# Patient Record
Sex: Male | Born: 1955
Health system: Southern US, Community
[De-identification: ages and names within clinical notes are randomized; demographics above are authoritative.]

## PROBLEM LIST (undated history)

## (undated) DIAGNOSIS — I10 Essential (primary) hypertension: Secondary | ICD-10-CM

## (undated) DIAGNOSIS — I509 Heart failure, unspecified: Secondary | ICD-10-CM

## (undated) HISTORY — DX: Essential (primary) hypertension: I10

## (undated) HISTORY — DX: Heart failure, unspecified: I50.9

---

## 1998-05-16 ENCOUNTER — Emergency Department (HOSPITAL_COMMUNITY): Admission: EM | Admit: 1998-05-16 | Discharge: 1998-05-16 | Payer: Self-pay | Admitting: Emergency Medicine

## 1999-05-27 ENCOUNTER — Emergency Department (HOSPITAL_COMMUNITY): Admission: EM | Admit: 1999-05-27 | Discharge: 1999-05-27 | Payer: Self-pay | Admitting: Emergency Medicine

## 1999-05-27 ENCOUNTER — Encounter: Payer: Self-pay | Admitting: Emergency Medicine

## 1999-11-09 ENCOUNTER — Emergency Department (HOSPITAL_COMMUNITY): Admission: EM | Admit: 1999-11-09 | Discharge: 1999-11-09 | Payer: Self-pay | Admitting: Emergency Medicine

## 2000-01-18 ENCOUNTER — Encounter: Admission: RE | Admit: 2000-01-18 | Discharge: 2000-01-18 | Payer: Self-pay | Admitting: *Deleted

## 2000-01-18 ENCOUNTER — Encounter: Payer: Self-pay | Admitting: *Deleted

## 2000-09-13 ENCOUNTER — Emergency Department (HOSPITAL_COMMUNITY): Admission: EM | Admit: 2000-09-13 | Discharge: 2000-09-13 | Payer: Self-pay | Admitting: Emergency Medicine

## 2000-09-13 ENCOUNTER — Encounter: Payer: Self-pay | Admitting: Emergency Medicine

## 2009-05-07 ENCOUNTER — Emergency Department (HOSPITAL_COMMUNITY): Admission: EM | Admit: 2009-05-07 | Discharge: 2009-05-07 | Payer: Self-pay | Admitting: Emergency Medicine

## 2013-09-01 ENCOUNTER — Encounter (HOSPITAL_COMMUNITY): Payer: Self-pay | Admitting: *Deleted

## 2013-09-01 ENCOUNTER — Emergency Department (HOSPITAL_COMMUNITY)
Admission: EM | Admit: 2013-09-01 | Discharge: 2013-09-01 | Disposition: A | Discharge status: Home / Self Care | Payer: Self-pay | Attending: Emergency Medicine | Admitting: Emergency Medicine

## 2013-09-01 DIAGNOSIS — L255 Unspecified contact dermatitis due to plants, except food: Secondary | ICD-10-CM | POA: Insufficient documentation

## 2013-09-01 DIAGNOSIS — L237 Allergic contact dermatitis due to plants, except food: Secondary | ICD-10-CM

## 2013-09-01 DIAGNOSIS — IMO0002 Reserved for concepts with insufficient information to code with codable children: Secondary | ICD-10-CM | POA: Insufficient documentation

## 2013-09-01 DIAGNOSIS — L03114 Cellulitis of left upper limb: Secondary | ICD-10-CM

## 2013-09-01 MED ORDER — CEPHALEXIN 500 MG PO CAPS: 500.0000 mg | ORAL_CAPSULE | Freq: Four times a day (QID) | ORAL | Status: DC

## 2013-09-01 MED ORDER — LORATADINE 10 MG PO TABS
10.0000 mg | ORAL_TABLET | Freq: Once | ORAL | Status: AC
  Administered 2013-09-01: 10 mg via ORAL
  Filled 2013-09-01: qty 1

## 2013-09-01 MED ORDER — DIPHENHYDRAMINE HCL 25 MG PO TABS: 25.0000 mg | ORAL_TABLET | Freq: Four times a day (QID) | ORAL | Status: DC

## 2013-09-01 MED ORDER — HYDROCORTISONE 1 % EX CREA: TOPICAL_CREAM | Freq: Two times a day (BID) | CUTANEOUS | Status: DC

## 2013-09-01 MED ORDER — SULFAMETHOXAZOLE-TRIMETHOPRIM 800-160 MG PO TABS: 1.0000 | ORAL_TABLET | Freq: Two times a day (BID) | ORAL | Status: DC

## 2013-09-01 MED ORDER — PREDNISONE 20 MG PO TABS: 40.0000 mg | ORAL_TABLET | Freq: Every day | ORAL | Status: DC

## 2013-09-01 NOTE — ED Notes (Signed)
Pt reports having poison ivy to bilateral arms x 1 week, no relief with otc creams. No acute distress noted at triage.

## 2013-09-01 NOTE — ED Provider Notes (Signed)
Medical screening examination/treatment/procedure(s) were performed by non-physician practitioner and as supervising physician I was immediately available for consultation/collaboration.  Hoy Morn, MD 09/01/13 [phone]

## 2013-09-01 NOTE — ED Provider Notes (Signed)
CSN: [id number]     Arrival date & time 09/01/13  1021 History   First MD Initiated Contact with Patient 09/01/13 1143     Chief Complaint  Patient presents with  . Rash   (Consider location/radiation/quality/duration/timing/severity/associated sxs/prior Treatment) HPI Comments: Patient is a 57 yo M presenting to the ED after an exposure to poison ivy nearly two weeks ago while working outside. Patient states he initially developed the pruritic non-draining non-painful rash on his left UE and has spread to chest and right upper extremity. Patient has tried "drying the rash" with OTC poison ivy creams which have helped minimally with the pruritis, but it has not resolved. Patient states he has been scratching at the rash since the outbreak. He denies any drainage from rash. Denies any fevers, SOB, DOE, wheezing.   Patient is a 57 y.o. male presenting with rash.  Rash Associated symptoms: no fever, no shortness of breath and not wheezing     History reviewed. No pertinent past medical history. History reviewed. No pertinent past surgical history. History reviewed. No pertinent family history. History  Substance Use Topics  . Smoking status: Never Smoker   . Smokeless tobacco: Not on file  . Alcohol Use: Yes     Comment: occ    Review of Systems  Constitutional: Negative for fever.  Respiratory: Negative for cough, choking, chest tightness, shortness of breath, wheezing and stridor.   Cardiovascular: Negative for chest pain.  Skin: Positive for color change and rash.  All other systems reviewed and are negative.    Allergies  Review of patient's allergies indicates no known allergies.  Home Medications   Current Outpatient Rx  Name  Route  Sig  Dispense  Refill  . Diphenhydramine-Calamine (IVAREST EX)   Apply externally   Apply 1 application topically daily as needed (poision ivy).         . NON FORMULARY   Oral   Take 1 tablet by mouth daily as needed (poision iv).        . cephALEXin (KEFLEX) 500 MG capsule   Oral   Take 1 capsule (500 mg total) by mouth 4 (four) times daily.   40 capsule   0   . diphenhydrAMINE (BENADRYL) 25 MG tablet   Oral   Take 1 tablet (25 mg total) by mouth every 6 (six) hours.   20 tablet   0   . hydrocortisone cream 1 %   Topical   Apply topically 2 (two) times daily.   30 g   2   . predniSONE (DELTASONE) 20 MG tablet   Oral   Take 2 tablets (40 mg total) by mouth daily.   10 tablet   0   . sulfamethoxazole-trimethoprim (SEPTRA DS) 800-160 MG per tablet   Oral   Take 1 tablet by mouth every 12 (twelve) hours.   20 tablet   0    BP 161/117  Pulse 72  Temp(Src) 98 F (36.7 C) (Oral)  Resp 18  SpO2 100% Physical Exam  Constitutional: He is oriented to person, place, and time. He appears well-developed and well-nourished. No distress.  HENT:  Head: Normocephalic and atraumatic.  Right Ear: External ear normal.  Left Ear: External ear normal.  Nose: Nose normal.  Eyes: Conjunctivae are normal.  Neck: Neck supple.  Pulmonary/Chest: Effort normal and breath sounds normal. No respiratory distress. He has no wheezes.  Neurological: He is alert and oriented to person, place, and time.  Skin: Skin is warm and  dry. Rash noted. No abrasion and no petechiae noted. Rash is vesicular. He is not diaphoretic. There is erythema.     Linear vesicular rash on bilateral UE, chest. No back, face, or genital involvement.   Rash on left UE warm with surrounding erythema.   Psychiatric: He has a normal mood and affect.    ED Course  Procedures (including critical care time) Labs Review Labs Reviewed - No data to display Imaging Review No results found.  MDM   1. Cellulitis of arm, left   2. Poison ivy dermatitis      Afebrile, NAD, non-toxic appearing, AAOx4.   1) Rash: No evidence of SJS or necrotizing fasciitis. Due to pruritic and not painful nature of blisters do not suspect pemphigus vulgaris.  Pustules do not resemble scabies as per pt hx or allergic reaction. No blisters, no pustules, no warmth, no draining sinus tracts, no superficial abscesses, no bullous impetigo, no desquamation, no target lesions with dusky purpura or a central bulla. Not tender to touch. Linear vesicles consistent with poison ivy.   2) Cellulitis: Suspect uncomplicated cellulitis based on limited area of involvement, minimal pain, no systemic signs of illness (eg, fever, chills, dehydration, altered mental status, tachypnea, tachycardia, hypotension), no risk factors for serious illness (eg, extremes of age, general debility, immunocompromised status).  PE reveals redness, swelling, mildly tender, warm to touch. Skin intact, No bleeding. No bullae. Non purulent. Non circumferential.  Borders are not elevated or sharply demarcated.  Pt was instructed to return to the ED if area surpasses the boarder or pain intensifies.  Will prescribed Bactrim to cover for MRSA, direct pt to apply warm compresses and to return to ED for I&D if pain should increase or abscess should develop.     Harlow Mares, PA-C 09/01/13 1457

## 2013-09-01 NOTE — ED Notes (Signed)
Advised PA of patient b/p.  Patient is asymptomatic.  Patient advised will follow up with primary care physician to check on his HTN.

## 2013-09-01 NOTE — ED Notes (Signed)
Called pharmacy to find out where meds were.  They advised that they are on the way.  I had previously messaged them for the medication.

## 2013-10-27 ENCOUNTER — Encounter (HOSPITAL_COMMUNITY): Payer: Self-pay | Admitting: Emergency Medicine

## 2013-10-27 ENCOUNTER — Emergency Department (HOSPITAL_COMMUNITY)
Admission: EM | Admit: 2013-10-27 | Discharge: 2013-10-27 | Disposition: A | Discharge status: Home / Self Care | Payer: Medicaid Other | Attending: Emergency Medicine | Admitting: Emergency Medicine

## 2013-10-27 DIAGNOSIS — L237 Allergic contact dermatitis due to plants, except food: Secondary | ICD-10-CM

## 2013-10-27 DIAGNOSIS — IMO0001 Reserved for inherently not codable concepts without codable children: Secondary | ICD-10-CM | POA: Insufficient documentation

## 2013-10-27 DIAGNOSIS — Z79899 Other long term (current) drug therapy: Secondary | ICD-10-CM | POA: Insufficient documentation

## 2013-10-27 DIAGNOSIS — L255 Unspecified contact dermatitis due to plants, except food: Secondary | ICD-10-CM | POA: Insufficient documentation

## 2013-10-27 MED ORDER — PREDNISONE 20 MG PO TABS: 40.0000 mg | ORAL_TABLET | Freq: Every day | ORAL | Status: DC

## 2013-10-27 MED ORDER — DEXAMETHASONE SODIUM PHOSPHATE 10 MG/ML IJ SOLN
10.0000 mg | Freq: Once | INTRAMUSCULAR | Status: AC
  Administered 2013-10-27: 10 mg via INTRAMUSCULAR
  Filled 2013-10-27: qty 1

## 2013-10-27 NOTE — ED Provider Notes (Signed)
CSN: [id number]     Arrival date & time 10/27/13  1249 History  This chart was scribed for non-physician practitioner, Alvina Chou, PA-C working with Carmin Muskrat, MD by Frederich Balding, ED scribe. This patient was seen in room TR04C/TR04C and the patient's care was started at 1:45 PM.   Chief Complaint  Patient presents with  . Rash   The history is provided by the patient. No language interpreter was used.   HPI Comments: Joel Beck is a 57 y.o. male who presents to the Emergency Department complaining of gradual onset, worsening rash to his left forearm. He states it is itchy and painful. Pt states it has started to move further up his arm. He states he was previously seen here for the same and was told it was poison ivy. Pt states no one at home has a similar rash and he has not been around poison ivy.   History reviewed. No pertinent past medical history. History reviewed. No pertinent past surgical history. History reviewed. No pertinent family history. History  Substance Use Topics  . Smoking status: Never Smoker   . Smokeless tobacco: Not on file  . Alcohol Use: Yes     Comment: occ    Review of Systems  Musculoskeletal: Positive for myalgias.  Skin: Positive for rash.  All other systems reviewed and are negative.   Allergies  Review of patient's allergies indicates no known allergies.  Home Medications   Current Outpatient Rx  Name  Route  Sig  Dispense  Refill  . cephALEXin (KEFLEX) 500 MG capsule   Oral   Take 1 capsule (500 mg total) by mouth 4 (four) times daily.   40 capsule   0   . diphenhydrAMINE (BENADRYL) 25 MG tablet   Oral   Take 1 tablet (25 mg total) by mouth every 6 (six) hours.   20 tablet   0   . Diphenhydramine-Calamine (IVAREST EX)   Apply externally   Apply 1 application topically daily as needed (poision ivy).         . hydrocortisone cream 1 %   Topical   Apply topically 2 (two) times daily.   30 g   2   . NON  FORMULARY   Oral   Take 1 tablet by mouth daily as needed (poision iv).         . predniSONE (DELTASONE) 20 MG tablet   Oral   Take 2 tablets (40 mg total) by mouth daily.   10 tablet   0   . sulfamethoxazole-trimethoprim (SEPTRA DS) 800-160 MG per tablet   Oral   Take 1 tablet by mouth every 12 (twelve) hours.   20 tablet   0    BP 173/126  Pulse 85  Temp(Src) 98.2 F (36.8 C) (Oral)  Resp 16  Wt 189 lb 11.2 oz (86.047 kg)  SpO2 98%  Physical Exam  Nursing note and vitals reviewed. Constitutional: He is oriented to person, place, and time. He appears well-developed and well-nourished. No distress.  HENT:  Head: Normocephalic and atraumatic.  Eyes: EOM are normal.  Neck: Neck supple. No tracheal deviation present.  Cardiovascular: Normal rate.   Pulmonary/Chest: Effort normal. No respiratory distress.  Musculoskeletal: Normal range of motion.  Neurological: He is alert and oriented to person, place, and time.  Skin: Skin is warm and dry. Rash noted.  3 patches of vesicular lesions on left forearm on an erythematous base. No drainage noted.   Psychiatric: He has a  normal mood and affect. His behavior is normal.    ED Course  Procedures (including critical care time)  DIAGNOSTIC STUDIES: Oxygen Saturation is 98% on RA, normal by my interpretation.    COORDINATION OF CARE: 1:48 PM-Discussed treatment plan which includes prednisone and continuing previous prescriptions with pt at bedside and pt agreed to plan. Advised pt to follow up with a PCP for his blood pressure.   Labs Review Labs Reviewed - No data to display Imaging Review No results found.  EKG Interpretation   None       MDM   1. Poison ivy     1:50 PM Patient likely has poison ivy. Patient will have IM decadron and a prescription for prednisone. Vitals stable and patient afebrile.   I personally performed the services described in this documentation, which was scribed in my presence. The  recorded information has been reviewed and is accurate.   Alvina Chou, PA-C 10/27/13 1402

## 2013-10-27 NOTE — ED Notes (Signed)
Pt c/o rash to left forearm; pt sts seen here for same and told was poison ivy

## 2013-10-27 NOTE — ED Provider Notes (Signed)
  Medical screening examination/treatment/procedure(s) were performed by non-physician practitioner and as supervising physician I was immediately available for consultation/collaboration.  EKG Interpretation   None          Carmin Muskrat, MD 10/27/13 1627

## 2014-07-04 ENCOUNTER — Encounter (HOSPITAL_COMMUNITY): Payer: Self-pay | Admitting: Emergency Medicine

## 2014-07-04 ENCOUNTER — Emergency Department (HOSPITAL_COMMUNITY): Payer: Medicaid Other

## 2014-07-04 ENCOUNTER — Inpatient Hospital Stay (HOSPITAL_COMMUNITY)
Admission: EM | Admit: 2014-07-04 | Discharge: 2014-07-07 | DRG: 287 | Disposition: A | Discharge status: Home / Self Care | Payer: Medicaid Other | Attending: Internal Medicine | Admitting: Internal Medicine

## 2014-07-04 DIAGNOSIS — R079 Chest pain, unspecified: Secondary | ICD-10-CM

## 2014-07-04 DIAGNOSIS — I5021 Acute systolic (congestive) heart failure: Secondary | ICD-10-CM

## 2014-07-04 DIAGNOSIS — F1010 Alcohol abuse, uncomplicated: Secondary | ICD-10-CM | POA: Diagnosis present

## 2014-07-04 DIAGNOSIS — Z79899 Other long term (current) drug therapy: Secondary | ICD-10-CM

## 2014-07-04 DIAGNOSIS — K219 Gastro-esophageal reflux disease without esophagitis: Secondary | ICD-10-CM

## 2014-07-04 DIAGNOSIS — I5022 Chronic systolic (congestive) heart failure: Secondary | ICD-10-CM | POA: Diagnosis present

## 2014-07-04 DIAGNOSIS — I509 Heart failure, unspecified: Secondary | ICD-10-CM

## 2014-07-04 DIAGNOSIS — I2510 Atherosclerotic heart disease of native coronary artery without angina pectoris: Secondary | ICD-10-CM | POA: Diagnosis present

## 2014-07-04 DIAGNOSIS — E785 Hyperlipidemia, unspecified: Secondary | ICD-10-CM | POA: Diagnosis present

## 2014-07-04 DIAGNOSIS — F1410 Cocaine abuse, uncomplicated: Secondary | ICD-10-CM | POA: Diagnosis present

## 2014-07-04 DIAGNOSIS — K2100 Gastro-esophageal reflux disease with esophagitis, without bleeding: Secondary | ICD-10-CM

## 2014-07-04 DIAGNOSIS — I10 Essential (primary) hypertension: Secondary | ICD-10-CM | POA: Diagnosis present

## 2014-07-04 DIAGNOSIS — I428 Other cardiomyopathies: Secondary | ICD-10-CM | POA: Diagnosis present

## 2014-07-04 LAB — CBC
HCT: 41.5 % (ref 39.0–52.0)
Hemoglobin: 14.4 g/dL (ref 13.0–17.0)
MCH: 31.2 pg (ref 26.0–34.0)
MCHC: 34.7 g/dL (ref 30.0–36.0)
MCV: 89.8 fL (ref 78.0–100.0)
PLATELETS: 182 10*3/uL (ref 150–400)
RBC: 4.62 MIL/uL (ref 4.22–5.81)
RDW: 12.3 % (ref 11.5–15.5)
WBC: 6.7 10*3/uL (ref 4.0–10.5)

## 2014-07-04 LAB — RAPID URINE DRUG SCREEN, HOSP PERFORMED
AMPHETAMINES: NOT DETECTED
BARBITURATES: NOT DETECTED
BENZODIAZEPINES: NOT DETECTED
COCAINE: NOT DETECTED
Opiates: NOT DETECTED
Tetrahydrocannabinol: NOT DETECTED

## 2014-07-04 LAB — BASIC METABOLIC PANEL
ANION GAP: 11 (ref 5–15)
BUN: 17 mg/dL (ref 6–23)
CALCIUM: 8.9 mg/dL (ref 8.4–10.5)
CO2: 24 mEq/L (ref 19–32)
CREATININE: 1.29 mg/dL (ref 0.50–1.35)
Chloride: 107 mEq/L (ref 96–112)
GFR calc Af Amer: 70 mL/min — ABNORMAL LOW (ref >90)
GFR, EST NON AFRICAN AMERICAN: 60 mL/min — AB (ref >90)
Glucose, Bld: 89 mg/dL (ref 70–99)
Potassium: 4.2 mEq/L (ref 3.7–5.3)
Sodium: 142 mEq/L (ref 137–147)

## 2014-07-04 LAB — PRO B NATRIURETIC PEPTIDE: Pro B Natriuretic peptide (BNP): 1550 pg/mL — ABNORMAL HIGH (ref 0–125)

## 2014-07-04 LAB — LIPID PANEL
Cholesterol: 171 mg/dL (ref 0–200)
HDL: 45 mg/dL (ref >39)
LDL CALC: 105 mg/dL — AB (ref 0–99)
TRIGLYCERIDES: 107 mg/dL (ref <150)
Total CHOL/HDL Ratio: 3.8 RATIO
VLDL: 21 mg/dL (ref 0–40)

## 2014-07-04 LAB — I-STAT TROPONIN, ED: Troponin i, poc: 0.01 ng/mL (ref 0.00–0.08)

## 2014-07-04 LAB — TROPONIN I
Troponin I: <0.3 ng/mL (ref <0.30)
Troponin I: <0.3 ng/mL (ref <0.30)

## 2014-07-04 LAB — D-DIMER, QUANTITATIVE: D-Dimer, Quant: 1.14 ug/mL-FEU — ABNORMAL HIGH (ref 0.00–0.48)

## 2014-07-04 MED ORDER — ENOXAPARIN SODIUM 40 MG/0.4ML ~~LOC~~ SOLN
40.0000 mg | SUBCUTANEOUS | Status: DC
  Administered 2014-07-04: 40 mg via SUBCUTANEOUS
  Filled 2014-07-04 (×2): qty 0.4

## 2014-07-04 MED ORDER — NITROGLYCERIN 0.2 MG/HR TD PT24: 0.2000 mg | MEDICATED_PATCH | Freq: Every day | TRANSDERMAL | Status: DC

## 2014-07-04 MED ORDER — HYDRALAZINE HCL 20 MG/ML IJ SOLN: 10.0000 mg | Freq: Four times a day (QID) | INTRAMUSCULAR | Status: DC | PRN

## 2014-07-04 MED ORDER — IOHEXOL 350 MG/ML SOLN
80.0000 mL | Freq: Once | INTRAVENOUS | Status: AC | PRN
  Administered 2014-07-04: 80 mL via INTRAVENOUS

## 2014-07-04 MED ORDER — SODIUM CHLORIDE 0.9 % IV SOLN
250.0000 mL | INTRAVENOUS | Status: DC | PRN
Start: 2014-07-04 — End: 2014-07-07

## 2014-07-04 MED ORDER — PNEUMOCOCCAL VAC POLYVALENT 25 MCG/0.5ML IJ INJ
0.5000 mL | INJECTION | INTRAMUSCULAR | Status: AC
  Administered 2014-07-05: 0.5 mL via INTRAMUSCULAR
  Filled 2014-07-04: qty 0.5

## 2014-07-04 MED ORDER — ASPIRIN EC 325 MG PO TBEC
325.0000 mg | DELAYED_RELEASE_TABLET | Freq: Every day | ORAL | Status: DC
Start: 2014-07-04 — End: 2014-07-07
  Administered 2014-07-04 – 2014-07-07 (×4): 325 mg via ORAL
  Filled 2014-07-04 (×5): qty 1

## 2014-07-04 MED ORDER — ACETAMINOPHEN 325 MG PO TABS
650.0000 mg | ORAL_TABLET | ORAL | Status: DC | PRN
  Administered 2014-07-05: 650 mg via ORAL
  Filled 2014-07-04: qty 2

## 2014-07-04 MED ORDER — ENOXAPARIN SODIUM 40 MG/0.4ML ~~LOC~~ SOLN: 40.0000 mg | SUBCUTANEOUS | Status: DC

## 2014-07-04 MED ORDER — SODIUM CHLORIDE 0.9 % IV BOLUS (SEPSIS): 1000.0000 mL | Freq: Once | INTRAVENOUS | Status: DC

## 2014-07-04 MED ORDER — ONDANSETRON HCL 4 MG/2ML IJ SOLN: 4.0000 mg | Freq: Four times a day (QID) | INTRAMUSCULAR | Status: DC | PRN

## 2014-07-04 MED ORDER — GI COCKTAIL ~~LOC~~: 30.0000 mL | Freq: Three times a day (TID) | ORAL | Status: DC | PRN

## 2014-07-04 MED ORDER — FUROSEMIDE 10 MG/ML IJ SOLN
20.0000 mg | Freq: Once | INTRAMUSCULAR | Status: AC
  Administered 2014-07-04: 20 mg via INTRAVENOUS

## 2014-07-04 MED ORDER — SODIUM CHLORIDE 0.9 % IJ SOLN: 3.0000 mL | INTRAMUSCULAR | Status: DC | PRN

## 2014-07-04 MED ORDER — SODIUM CHLORIDE 0.9 % IV BOLUS (SEPSIS)
1000.0000 mL | Freq: Once | INTRAVENOUS | Status: AC
  Administered 2014-07-04: 1000 mL via INTRAVENOUS

## 2014-07-04 MED ORDER — ACETAMINOPHEN 325 MG PO TABS: 650.0000 mg | ORAL_TABLET | ORAL | Status: DC | PRN

## 2014-07-04 MED ORDER — PANTOPRAZOLE SODIUM 40 MG PO TBEC
40.0000 mg | DELAYED_RELEASE_TABLET | Freq: Every day | ORAL | Status: DC
  Administered 2014-07-05 – 2014-07-07 (×3): 40 mg via ORAL
  Filled 2014-07-04 (×3): qty 1

## 2014-07-04 MED ORDER — ISOSORBIDE MONONITRATE ER 30 MG PO TB24
30.0000 mg | ORAL_TABLET | Freq: Every day | ORAL | Status: DC
  Administered 2014-07-04 – 2014-07-07 (×4): 30 mg via ORAL
  Filled 2014-07-04 (×5): qty 1

## 2014-07-04 MED ORDER — METOPROLOL TARTRATE 1 MG/ML IV SOLN
20.0000 mg | Freq: Once | INTRAVENOUS | Status: AC
  Administered 2014-07-04: 10 mg via INTRAVENOUS
  Filled 2014-07-04: qty 20

## 2014-07-04 MED ORDER — FUROSEMIDE 10 MG/ML IJ SOLN
INTRAMUSCULAR | Status: AC
  Filled 2014-07-04: qty 4

## 2014-07-04 MED ORDER — CARVEDILOL 3.125 MG PO TABS
3.1250 mg | ORAL_TABLET | Freq: Two times a day (BID) | ORAL | Status: DC
  Administered 2014-07-04 – 2014-07-07 (×7): 3.125 mg via ORAL
  Filled 2014-07-04 (×11): qty 1

## 2014-07-04 MED ORDER — GI COCKTAIL ~~LOC~~
30.0000 mL | Freq: Four times a day (QID) | ORAL | Status: DC | PRN
  Filled 2014-07-04: qty 30

## 2014-07-04 MED ORDER — SODIUM CHLORIDE 0.9 % IJ SOLN
3.0000 mL | Freq: Two times a day (BID) | INTRAMUSCULAR | Status: DC
  Administered 2014-07-04 – 2014-07-07 (×5): 3 mL via INTRAVENOUS

## 2014-07-04 NOTE — ED Notes (Signed)
Returned from xray

## 2014-07-04 NOTE — ED Notes (Signed)
Pt returned from ct

## 2014-07-04 NOTE — ED Provider Notes (Signed)
CSN: [id number]     Arrival date & time 07/04/14  1106 History   First MD Initiated Contact with Patient 07/04/14 1157     Chief Complaint  Patient presents with  . Shortness of Breath  . Chest Pain     (Consider location/radiation/quality/duration/timing/severity/associated sxs/prior Treatment) HPI Comments: The patient is a 58 year old male presenting to emergency room at with a chief complaint of dyspnea worsening for 3 weeks. The patient reports increase in dyspnea on exertion. He reports orthopnea. He reports associated left-sided chest discomfort. He reports these symptoms worsen at night, with lying flat. He reports cough. The patient denies previous cardiac workup. Denies history of MI, cardiac disease, lung disorders, hypertension.  NO PCP  The history is provided by the patient. No language interpreter was used.    History reviewed. No pertinent past medical history. History reviewed. No pertinent past surgical history. History reviewed. No pertinent family history. History  Substance Use Topics  . Smoking status: Never Smoker   . Smokeless tobacco: Not on file  . Alcohol Use: Yes     Comment: occ    Review of Systems    Allergies  Review of patient's allergies indicates no known allergies.  Home Medications   Prior to Admission medications   Medication Sig Start Date End Date Taking? Authorizing Provider  naproxen sodium (ANAPROX) 220 MG tablet Take 220 mg by mouth daily as needed (Headache).   Yes Historical Provider, MD   BP 156/115  Pulse 99  Temp(Src) 98.1 F (36.7 C) (Oral)  Resp 22  SpO2 96% Physical Exam  Nursing note and vitals reviewed. Constitutional: He appears well-developed and well-nourished.  Non-toxic appearance. He does not have a sickly appearance. He does not appear ill. No distress.  HENT:  Head: Normocephalic and atraumatic.  Mouth/Throat: No oropharyngeal exudate.  Eyes: EOM are normal. Pupils are equal, round, and reactive to  light.  Neck: Neck supple.  Cardiovascular: Tachycardia present.   Pulses:      Radial pulses are 2+ on the right side, and 2+ on the left side.  No lower extremity edema, no calf tenderness.  Pulmonary/Chest: Effort normal. No respiratory distress. He has no wheezes. He has no rales.  Patient is able to speak in complete sentences.   Abdominal: Soft. There is no tenderness. There is no rebound.  Skin: Skin is warm and dry. He is not diaphoretic.    ED Course  Procedures (including critical care time) Labs Review Results for orders placed during the hospital encounter of 07/04/14  CBC      Result Value Ref Range   WBC 6.7  4.0 - 10.5 K/uL   RBC 4.62  4.22 - 5.81 MIL/uL   Hemoglobin 14.4  13.0 - 17.0 g/dL   HCT 41.5  39.0 - 52.0 %   MCV 89.8  78.0 - 100.0 fL   MCH 31.2  26.0 - 34.0 pg   MCHC 34.7  30.0 - 36.0 g/dL   RDW 12.3  11.5 - 15.5 %   Platelets 182  150 - 400 K/uL  BASIC METABOLIC PANEL      Result Value Ref Range   Sodium 142  137 - 147 mEq/L   Potassium 4.2  3.7 - 5.3 mEq/L   Chloride 107  96 - 112 mEq/L   CO2 24  19 - 32 mEq/L   Glucose, Bld 89  70 - 99 mg/dL   BUN 17  6 - 23 mg/dL   Creatinine, Ser 1.29  0.50 - 1.35 mg/dL   Calcium 8.9  8.4 - 10.5 mg/dL   GFR calc non Af Amer 60 (*) >90 mL/min   GFR calc Af Amer 70 (*) >90 mL/min   Anion gap 11  5 - 15  PRO B NATRIURETIC PEPTIDE      Result Value Ref Range   Pro B Natriuretic peptide (BNP) 1550.0 (*) 0 - 125 pg/mL  D-DIMER, QUANTITATIVE      Result Value Ref Range   D-Dimer, Quant 1.14 (*) 0.00 - 0.48 ug/mL-FEU  I-STAT TROPOININ, ED      Result Value Ref Range   Troponin i, poc 0.01  0.00 - 0.08 ng/mL   Comment 3            Dg Chest 2 View  07/04/2014   CLINICAL DATA:  Short of breath.  Chest pain.  EXAM: CHEST  2 VIEW  COMPARISON:  None.  FINDINGS: Moderate CHF is present with asymmetric pulmonary edema. There is diffuse interstitial edema with basilar predominant alveolar edema. Cardiopericardial  silhouette is enlarged. Aortic arch atherosclerosis. No focal consolidation to suggest pneumonia.  IMPRESSION: Moderate CHF.   Electronically Signed   By: Dereck Ligas M.D.   On: 07/04/2014 14:47   Ct Angio Chest Pe W/cm &/or Wo Cm  07/04/2014   CLINICAL DATA:  Left chest pain and some shortness of breath for the past 3 weeks.  EXAM: CT ANGIOGRAPHY CHEST WITH CONTRAST  TECHNIQUE: Multidetector CT imaging of the chest was performed using the standard protocol during bolus administration of intravenous contrast. Multiplanar CT image reconstructions and MIPs were obtained to evaluate the vascular anatomy.  CONTRAST:  2mL OMNIPAQUE IOHEXOL 350 MG/ML SOLN  COMPARISON:  Chest radiographs obtained earlier today.  FINDINGS: There is reflux of contrast into the inferior vena cava and hepatic veins. The heart is mildly enlarged with normal sized right atrium and ventricle and moderately dilated left ventricle and left atrium. The pulmonary arteries are normally opacified with no pulmonary arterial filling defects seen. The pulmonary vasculature remains prominent. Mild interstitial pulmonary edema is again demonstrated with peripheral Kerley lines. No lung nodules or enlarged lymph nodes. Mild thoracic and upper lumbar spine degenerative changes. Small right lobe liver cyst.  Review of the MIP images confirms the above findings.  IMPRESSION: 1. Cardiomegaly and mild acute congestive heart failure. 2. Reflux contrast into the inferior vena cava and hepatic veins, suggesting an element of right heart failure. 3. No pulmonary emboli.   Electronically Signed   By: Enrique Sack M.D.   On: 07/04/2014 15:23    EKG Interpretation   Date/Time:  Sunday July 04 2014 11:21:40 EDT Ventricular Rate:  108 PR Interval:  154 QRS Duration: 102 QT Interval:  360 QTC Calculation: 482 R Axis:   72 Text Interpretation:  Sinus tachycardia Biatrial enlargement Left  ventricular hypertrophy c increased voltage and Lateral T  inversions  Abnormal ECG Confirmed by Jeneen Rinks  MD, [place] ([id number]) on 07/04/2014 11:33:57 AM      MDM   Final diagnoses:  Acute heart failure, unspecified heart failure type   Patient presents with left-sided chest discomfort and dyspnea persistent for 3 weeks, worse with exertion. No risks factors for PE, but persistently tachycardic on exam no lower extremity edema or calf tenderness on exam. Will order a d-dimer.  EKG shows sinus tach, biatrial margin and in lateral T wave inversions no EKG available for comparison. D-dimer elevated at 1.15, CTA ordered.  Patient's blood pressure persistently elevated,  Lopressor ordered IV. Chest x-ray shows moderate CHF, BNP elevated at 1550. Per EMR the patient's blood pressure has been persistently elevated over the last 2 years. Plan to admit the patient for acute heart failure, and further evaluation of chest discomfort. Discussed labs,abnormal EKG, x-ray, results with patient and patient's family and need for admission, agree to admission at this time.  Discussed patient history and workup with Dr.Rai, who agrees to admission.   Lorrine Kin, PA-C 07/04/14 1945

## 2014-07-04 NOTE — H&P (Signed)
History and Physical       Hospital Admission Note Date: 07/04/2014  Patient name: Joel Beck Medical record number: [id number] Date of birth: [birth year] Age: 58 y.o. Gender: male  PCP: No PCP Per Patient    Chief Complaint:  Chest pain with shortness of breath, intermittent for last 2-3 weeks  HPI: Patient is a 58 year old male with no medical history, has not seen any any physicians in the past for regular medical care presented to ER with chief complaint of intermittent chest pain with shortness of breath for last 2-3 weeks. History was obtained from the patient who reported that he has noticed intermittent left-sided chest pain with associated shortness of breath, episodic, worse with exertion, lasting about 15-20 minutes and improved some rest. He also noticed shortness of breath worse with exertion and at night on laying flat. He denies any prior cardiac workup. He's not on any medications. At the time of arrival to the ER, patient's BP was 156/115. Patient also reports intermittent headaches and blurry vision, with chest pains. He denies any pedal edema. Patient also reports off and on "heartburn" .  Review of Systems:  Constitutional: Denies fever, chills, diaphoresis, poor appetite and fatigue.  HEENT: Denies photophobia, eye pain, redness, hearing loss, ear pain, congestion, sore throat, rhinorrhea, sneezing, mouth sores, trouble swallowing, neck pain, neck stiffness and tinnitus.   Respiratory: Please see history of present illness Cardiovascular: Please see history of present illness Gastrointestinal: Denies nausea, vomiting, abdominal pain, diarrhea, constipation, blood in stool and abdominal distention.  Genitourinary: Denies dysuria, urgency, frequency, hematuria, flank pain and difficulty urinating.  Musculoskeletal: Denies myalgias, back pain, joint swelling, arthralgias and gait problem.  Skin: Denies pallor, rash and  wound.  Neurological: Denies dizziness, seizures, syncope, weakness, light-headedness, numbness and headaches.  Hematological: Denies adenopathy. Easy bruising, personal or family bleeding history  Psychiatric/Behavioral: Denies suicidal ideation, mood changes, confusion, nervousness, sleep disturbance and agitation  Past Medical History: History reviewed. No pertinent past medical history. History reviewed. No pertinent past surgical history.  Medications: Prior to Admission medications   Medication Sig Start Date End Date Taking? Authorizing Provider  naproxen sodium (ANAPROX) 220 MG tablet Take 220 mg by mouth daily as needed (Headache).   Yes Historical Provider, MD    Allergies:  No Known Allergies  Social History:  reports that he has never smoked. He does not have any smokeless tobacco history on file. He reports that he drinks alcohol. He reports that he does not use illicit drugs.  Family History: History reviewed. No pertinent family history.  Physical Exam: Blood pressure 149/98, pulse 86, temperature 98.1 F (36.7 C), temperature source Oral, resp. rate 24, SpO2 99.00%. General: Alert, awake, oriented x3, in no acute distress. HEENT: normocephalic, atraumatic, anicteric sclera, pink conjunctiva, pupils equal and reactive to light and accomodation, oropharynx clear Neck: supple, no masses or lymphadenopathy, no goiter, no bruits  Heart: Regular rate and rhythm, without murmurs, rubs or gallops. Lungs: Clear to auscultation bilaterally, no wheezing, rales or rhonchi. Abdomen: Soft, nontender, nondistended, positive bowel sounds, no masses. Extremities: No clubbing, cyanosis or edema with positive pedal pulses. Neuro: Grossly intact, no focal neurological deficits, strength 5/5 upper and lower extremities bilaterally Psych: alert and oriented x 3, normal mood and affect Skin: no rashes or lesions, warm and dry   LABS on Admission:  Basic Metabolic Panel:  Recent  Labs Lab 07/04/14 1156  NA 142  K 4.2  CL 107  CO2 24  GLUCOSE 89  BUN 17  CREATININE 1.29  CALCIUM 8.9   Liver Function Tests: No results found for this basename: AST, ALT, ALKPHOS, BILITOT, PROT, ALBUMIN,  in the last 168 hours No results found for this basename: LIPASE, AMYLASE,  in the last 168 hours No results found for this basename: AMMONIA,  in the last 168 hours CBC:  Recent Labs Lab 07/04/14 1156  WBC 6.7  HGB 14.4  HCT 41.5  MCV 89.8  PLT 182   Cardiac Enzymes: No results found for this basename: CKTOTAL, CKMB, CKMBINDEX, TROPONINI,  in the last 168 hours BNP: No components found with this basename: POCBNP,  CBG: No results found for this basename: GLUCAP,  in the last 168 hours   Radiological Exams on Admission: Dg Chest 2 View  07/04/2014   CLINICAL DATA:  Short of breath.  Chest pain.  EXAM: CHEST  2 VIEW  COMPARISON:  None.  FINDINGS: Moderate CHF is present with asymmetric pulmonary edema. There is diffuse interstitial edema with basilar predominant alveolar edema. Cardiopericardial silhouette is enlarged. Aortic arch atherosclerosis. No focal consolidation to suggest pneumonia.  IMPRESSION: Moderate CHF.   Electronically Signed   By: Dereck Ligas M.D.   On: 07/04/2014 14:47   Ct Angio Chest Pe W/cm &/or Wo Cm  07/04/2014   CLINICAL DATA:  Left chest pain and some shortness of breath for the past 3 weeks.  EXAM: CT ANGIOGRAPHY CHEST WITH CONTRAST  TECHNIQUE: Multidetector CT imaging of the chest was performed using the standard protocol during bolus administration of intravenous contrast. Multiplanar CT image reconstructions and MIPs were obtained to evaluate the vascular anatomy.  CONTRAST:  68mL OMNIPAQUE IOHEXOL 350 MG/ML SOLN  COMPARISON:  Chest radiographs obtained earlier today.  FINDINGS: There is reflux of contrast into the inferior vena cava and hepatic veins. The heart is mildly enlarged with normal sized right atrium and ventricle and moderately  dilated left ventricle and left atrium. The pulmonary arteries are normally opacified with no pulmonary arterial filling defects seen. The pulmonary vasculature remains prominent. Mild interstitial pulmonary edema is again demonstrated with peripheral Kerley lines. No lung nodules or enlarged lymph nodes. Mild thoracic and upper lumbar spine degenerative changes. Small right lobe liver cyst.  Review of the MIP images confirms the above findings.  IMPRESSION: 1. Cardiomegaly and mild acute congestive heart failure. 2. Reflux contrast into the inferior vena cava and hepatic veins, suggesting an element of right heart failure. 3. No pulmonary emboli.   Electronically Signed   By: Enrique Sack M.D.   On: 07/04/2014 15:23    Assessment/Plan Principal Problem:   Chest pain: With typical features, risk factors include uncontrolled hypertension, EKG with T wave inversion in the lateral leads, severe LVH. CT angiogram negative for PE, shows cardiomegaly with acute CHF. Chest pain currently resolved - Admit to telemetry, observation, serial cardiac enzymes x3, urine drug screen - Obtain 2-D echocardiogram, lipid panel for further workup - Placed on heart healthy diet, n.p.o. after midnight for possible stress test, will obtain cardiology consult - Place on aspirin, Coreg, Imdur.  Active Problems:   Acute CHF: EF unknown, no prior workup, BNP elevated at 1550 - DC IV fluids, give 1 dose of Lasix 20 mg IV x1, Coreg, aspirin    Accelerated hypertension - Placed on Lasix, Coreg, hydralazine as needed  GERD: - Placed on Protonix, GI cocktail as needed   DVT prophylaxis: Lovenox  CODE STATUS: Full code  Family Communication: Admission, patients condition and plan of care  including tests being ordered have been discussed with the patient and family members who indicates understanding and agree with the plan and Code Status   Further plan will depend as patient's clinical course evolves and further  radiologic and laboratory data become available.   Time Spent on Admission: 37 minutes  Ozelle Brubacher M.D. [facility] 07/04/2014, 5:01 PM Pager: [phone]  If 7PM-7AM, please contact night-coverage [email or web address] Password [email or web address]  **Disclaimer: This note was dictated with voice recognition software. Similar sounding words can inadvertently be transcribed and this note may contain transcription errors which may not have been corrected upon publication of note.**

## 2014-07-04 NOTE — ED Notes (Signed)
Pt ambulatory in hallway brisk pace pt denies any pain or discomfort hr= 100/min oxygen sat 98%

## 2014-07-04 NOTE — ED Notes (Signed)
Pt c/o left sided CP and some SOB x 3 weeks; pt sts worse when moving around

## 2014-07-04 NOTE — ED Notes (Signed)
Pt to ct 

## 2014-07-05 ENCOUNTER — Encounter (HOSPITAL_COMMUNITY)
Admission: EM | Disposition: A | Discharge status: Home / Self Care | Payer: Self-pay | Source: Home / Self Care | Attending: Neurology

## 2014-07-05 DIAGNOSIS — K219 Gastro-esophageal reflux disease without esophagitis: Secondary | ICD-10-CM | POA: Diagnosis present

## 2014-07-05 DIAGNOSIS — I059 Rheumatic mitral valve disease, unspecified: Secondary | ICD-10-CM

## 2014-07-05 DIAGNOSIS — I10 Essential (primary) hypertension: Secondary | ICD-10-CM

## 2014-07-05 DIAGNOSIS — I5021 Acute systolic (congestive) heart failure: Secondary | ICD-10-CM | POA: Diagnosis present

## 2014-07-05 DIAGNOSIS — I509 Heart failure, unspecified: Secondary | ICD-10-CM

## 2014-07-05 DIAGNOSIS — Z79899 Other long term (current) drug therapy: Secondary | ICD-10-CM | POA: Diagnosis not present

## 2014-07-05 DIAGNOSIS — E785 Hyperlipidemia, unspecified: Secondary | ICD-10-CM | POA: Diagnosis present

## 2014-07-05 DIAGNOSIS — I428 Other cardiomyopathies: Secondary | ICD-10-CM | POA: Diagnosis present

## 2014-07-05 DIAGNOSIS — R079 Chest pain, unspecified: Secondary | ICD-10-CM | POA: Diagnosis present

## 2014-07-05 DIAGNOSIS — I2510 Atherosclerotic heart disease of native coronary artery without angina pectoris: Secondary | ICD-10-CM | POA: Diagnosis present

## 2014-07-05 DIAGNOSIS — F1410 Cocaine abuse, uncomplicated: Secondary | ICD-10-CM | POA: Diagnosis present

## 2014-07-05 DIAGNOSIS — F1010 Alcohol abuse, uncomplicated: Secondary | ICD-10-CM | POA: Diagnosis present

## 2014-07-05 DIAGNOSIS — I5022 Chronic systolic (congestive) heart failure: Secondary | ICD-10-CM | POA: Diagnosis present

## 2014-07-05 HISTORY — DX: Heart failure, unspecified: I50.9

## 2014-07-05 HISTORY — PX: LEFT HEART CATHETERIZATION WITH CORONARY ANGIOGRAM: SHX5451

## 2014-07-05 LAB — CBC
HEMATOCRIT: 37.9 % — AB (ref 39.0–52.0)
Hemoglobin: 13.1 g/dL (ref 13.0–17.0)
MCH: 31.3 pg (ref 26.0–34.0)
MCHC: 34.6 g/dL (ref 30.0–36.0)
MCV: 90.5 fL (ref 78.0–100.0)
Platelets: 182 10*3/uL (ref 150–400)
RBC: 4.19 MIL/uL — ABNORMAL LOW (ref 4.22–5.81)
RDW: 12.2 % (ref 11.5–15.5)
WBC: 7 10*3/uL (ref 4.0–10.5)

## 2014-07-05 LAB — BASIC METABOLIC PANEL
Anion gap: 12 (ref 5–15)
BUN: 23 mg/dL (ref 6–23)
CALCIUM: 8.5 mg/dL (ref 8.4–10.5)
CO2: 23 mEq/L (ref 19–32)
CREATININE: 1.36 mg/dL — AB (ref 0.50–1.35)
Chloride: 105 mEq/L (ref 96–112)
GFR calc non Af Amer: 57 mL/min — ABNORMAL LOW (ref >90)
GFR, EST AFRICAN AMERICAN: 66 mL/min — AB (ref >90)
Glucose, Bld: 91 mg/dL (ref 70–99)
Potassium: 3.9 mEq/L (ref 3.7–5.3)
Sodium: 140 mEq/L (ref 137–147)

## 2014-07-05 LAB — CREATININE, SERUM
Creatinine, Ser: 1.35 mg/dL (ref 0.50–1.35)
GFR calc Af Amer: 66 mL/min — ABNORMAL LOW (ref >90)
GFR calc non Af Amer: 57 mL/min — ABNORMAL LOW (ref >90)

## 2014-07-05 LAB — TROPONIN I: Troponin I: <0.3 ng/mL (ref <0.30)

## 2014-07-05 SURGERY — LEFT HEART CATHETERIZATION WITH CORONARY ANGIOGRAM
Anesthesia: LOCAL

## 2014-07-05 MED ORDER — HEPARIN SODIUM (PORCINE) 1000 UNIT/ML IJ SOLN
INTRAMUSCULAR | Status: AC
  Filled 2014-07-05: qty 1

## 2014-07-05 MED ORDER — NITROGLYCERIN 1 MG/10 ML FOR IR/CATH LAB
INTRA_ARTERIAL | Status: AC
  Filled 2014-07-05: qty 10

## 2014-07-05 MED ORDER — MIDAZOLAM HCL 2 MG/2ML IJ SOLN
INTRAMUSCULAR | Status: AC
  Filled 2014-07-05: qty 2

## 2014-07-05 MED ORDER — SODIUM CHLORIDE 0.9 % IV SOLN
INTRAVENOUS | Status: AC
  Administered 2014-07-05: 17:00:00 via INTRAVENOUS

## 2014-07-05 MED ORDER — LIDOCAINE HCL (PF) 1 % IJ SOLN
INTRAMUSCULAR | Status: AC
  Filled 2014-07-05: qty 30

## 2014-07-05 MED ORDER — FENTANYL CITRATE 0.05 MG/ML IJ SOLN
INTRAMUSCULAR | Status: AC
  Filled 2014-07-05: qty 2

## 2014-07-05 MED ORDER — VERAPAMIL HCL 2.5 MG/ML IV SOLN
INTRAVENOUS | Status: AC
  Filled 2014-07-05: qty 2

## 2014-07-05 MED ORDER — ENOXAPARIN SODIUM 40 MG/0.4ML ~~LOC~~ SOLN
40.0000 mg | SUBCUTANEOUS | Status: DC
  Administered 2014-07-06 – 2014-07-07 (×2): 40 mg via SUBCUTANEOUS
  Filled 2014-07-05 (×3): qty 0.4

## 2014-07-05 MED ORDER — HEPARIN (PORCINE) IN NACL 2-0.9 UNIT/ML-% IJ SOLN
INTRAMUSCULAR | Status: AC
  Filled 2014-07-05: qty 1000

## 2014-07-05 NOTE — Interval H&P Note (Signed)
History and Physical Interval Note:  07/05/2014 3:47 PM  Joel Beck  has presented today for surgery, with the diagnosis of cp  The various methods of treatment have been discussed with the patient and family. After consideration of risks, benefits and other options for treatment, the patient has consented to  Procedure(s): LEFT HEART CATHETERIZATION WITH CORONARY ANGIOGRAM (N/A) as a surgical intervention .  The patient's history has been reviewed, patient examined, no change in status, stable for surgery.  I have reviewed the patient's chart and labs.  Questions were answered to the patient's satisfaction.   Cath Lab Visit (complete for each Cath Lab visit)  Clinical Evaluation Leading to the Procedure:   ACS: Yes.    Non-ACS:    Anginal Classification: CCS III  Anti-ischemic medical therapy: Maximal Therapy (2 or more classes of medications)  Non-Invasive Test Results: High-risk stress test findings: cardiac mortality >3%/year  Prior CABG: No previous CABG        Joel Beck [facility] 07/05/2014 3:47 PM

## 2014-07-05 NOTE — Care Management Note (Addendum)
  Page 2 of 2   07/07/2014     2:30:13 PM CARE MANAGEMENT NOTE 07/07/2014  Patient:  Joel Beck,Joel Beck   Account Number:  [id number]  Date Initiated:  07/05/2014  Documentation initiated by:  Luwanna Brossman  Subjective/Objective Assessment:   Chest pain, HTN, Heart failure     Action/Plan:   CM to follow for dispositon needs   Anticipated DC Date:  07/06/2014   Anticipated DC Plan:  HOME/SELF CARE  In-house referral  Clinical Social Worker      DC Planning Services  CM consult  Follow-up appt scheduled  Medication [facility]      Choice offered to / List presented to:     DME arranged  VEST - LIFE VEST           Status of service:  Completed, signed off Medicare Important Message given?   (If response is "NO", the following Medicare IM given date fields will be blank) Date Medicare IM given:   Medicare IM given by:   Date Additional Medicare IM given:   Additional Medicare IM given by:    Discharge Disposition:  HOME/SELF CARE  Per UR Regulation:  Reviewed for med. necessity/level of care/duration of stay  If discussed at [place] of Stay Meetings, dates discussed:    Comments:  Joe Tanney RN, BSN, MSHL, CCM  Nurse - Case Manager,  (Unit [phone]  8/3/201 Meds reviewed with Unit Pharmacist for $4.00 medication list. LIFE VEST  order intiated and representative/[place] working with processing. 2nd attempted call left with F.A. Aileen Fass   [id number] re:  self pay / mcd potential CM contacted other FA Michele [place] who confirms MCD appt scheduled for 2:30 today. Disposition:  Home / Self care with life vest  Ruhan Borak RN, BSN, MSHL, CCM  Nurse - Case Manager,  (Unit [place])  [phone]  8/3/201 Social:  From home.  Self pay and no PCP. Cardiology/PCP appt established with CHW with Dr. Verl Blalock 07/21/2012 at 123456 [place] card application provided to patient $4.00 list sample provided to patient with instructions on  available pharmacies. CM Left VMM with F.A. Aileen Fass  [id number] re:  self pay / mcd potential Disposition Plan:  Home / self care. CM will review meds at Beck/c for $4.00 list option v need for [facility] assistance.

## 2014-07-05 NOTE — Progress Notes (Signed)
Echocardiogram 2D Echocardiogram has been performed.  Joel Beck 07/05/2014, 10:03 AM

## 2014-07-05 NOTE — H&P (View-Only) (Signed)
CARDIOLOGY CONSULT NOTE   Patient ID: Joel Beck MRN: [id number], DOB/AGE: 1956-02-16   Admit date: 07/04/2014 Date of Consult: 07/05/2014  Primary Physician: No PCP Per Patient Primary Cardiologist: None  Reason for consult:  Chest pain  Problem List  History reviewed. No pertinent past medical history.  History reviewed. No pertinent past surgical history.   Allergies  No Known Allergies  HPI   Patient is a 58 year old male with no medical history, has not seen any any physicians in the past for regular medical care presented to ER with chief complaint of intermittent chest pain with shortness of breath for last 2-3 weeks. History was obtained from the patient who reported that he has noticed intermittent left-sided chest pain with associated shortness of breath, episodic, worse with exertion, lasting about 15-20 minutes and improved some rest. He admits to orthopnea and paroxysmal nocturnal dyspnea in the last 2 weeks. This has resolved since the admission He's not on any medications. At the time of arrival to the ER, patient's BP was 156/115. Patient also reports intermittent headaches and blurry vision, with chest pains. He denies any pedal edema. Patient also reports off and on "heartburn". He has never smoked. No FH of premature CAD. He complains of lot of stress at home.   Inpatient Medications  . aspirin EC  325 mg Oral Daily  . carvedilol  3.125 mg Oral BID WC  . enoxaparin (LOVENOX) injection  40 mg Subcutaneous Q24H  . isosorbide mononitrate  30 mg Oral Daily  . pantoprazole  40 mg Oral Q0600  . pneumococcal 23 valent vaccine  0.5 mL Intramuscular Tomorrow-1000  . sodium chloride  3 mL Intravenous Q12H    Family History History reviewed. No pertinent family history.   Social History History   Social History  . Marital Status: Married    Spouse Name: N/A    Number of Children: N/A  . Years of Education: N/A   Occupational History  . Not on file.    Social History Main Topics  . Smoking status: Never Smoker   . Smokeless tobacco: Not on file  . Alcohol Use: Yes     Comment: occ  . Drug Use: No  . Sexual Activity: Not on file   Other Topics Concern  . Not on file   Social History Narrative  . No narrative on file    Review of Systems  General:  No chills, fever, night sweats or weight changes.  Cardiovascular:  No chest pain, dyspnea on exertion, edema, orthopnea, palpitations, paroxysmal nocturnal dyspnea. Dermatological: No rash, lesions/masses Respiratory: No cough, dyspnea Urologic: No hematuria, dysuria Abdominal:   No nausea, vomiting, diarrhea, bright red blood per rectum, melena, or hematemesis Neurologic:  No visual changes, wkns, changes in mental status. All other systems reviewed and are otherwise negative except as noted above.  Physical Exam  Blood pressure 122/82, pulse 70, temperature 98.2 F (36.8 C), temperature source Oral, resp. rate 18, height 5\' 10"  (1.778 m), weight 177 lb 1.6 oz (80.332 kg), SpO2 97.00%.  General: Pleasant, NAD Psych: Normal affect. Neuro: Alert and oriented X 3. Moves all extremities spontaneously. HEENT: Normal  Neck: Supple without bruits, JVD + 6 cm. Lungs:  Resp regular and unlabored, mild crackles at bases. Heart: RRR no s3, s4, or murmurs. Abdomen: Soft, non-tender, non-distended, BS + x 4.  Extremities: No clubbing, cyanosis or edema. DP/PT/Radials 2+ and equal bilaterally.  Labs  Recent Labs  07/04/14 1839 07/04/14 2136 07/05/14 0015  TROPONINI <0.30 <0.30 <0.30   Lab Results  Component Value Date   WBC 6.7 07/04/2014   HGB 14.4 07/04/2014   HCT 41.5 07/04/2014   MCV 89.8 07/04/2014   PLT 182 07/04/2014    Recent Labs Lab 07/05/14 0015  NA 140  K 3.9  CL 105  CO2 23  BUN 23  CREATININE 1.36*  CALCIUM 8.5  GLUCOSE 91   Lab Results  Component Value Date   CHOL 171 07/04/2014   HDL 45 07/04/2014   LDLCALC 105* 07/04/2014   TRIG 107 07/04/2014   Lab Results   Component Value Date   DDIMER 1.14* 07/04/2014   Radiology/Studies  Dg Chest 2 View  07/04/2014   CLINICAL DATA:  Short of breath.  Chest pain.  EXAM: CHEST  2 VIEW  COMPARISON:  None.  FINDINGS: Moderate CHF is present with asymmetric pulmonary edema. There is diffuse interstitial edema with basilar predominant alveolar edema. Cardiopericardial silhouette is enlarged. Aortic arch atherosclerosis. No focal consolidation to suggest pneumonia.  IMPRESSION: Moderate CHF.   Electronically Signed   By: Dereck Ligas M.D.   On: 07/04/2014 14:47   Ct Angio Chest Pe W/cm &/or Wo Cm  07/04/2014   CLINICAL DATA:  Left chest pain and some shortness of breath for the past 3 weeks.  IMPRESSION: 1. Cardiomegaly and mild acute congestive heart failure. 2. Reflux contrast into the inferior vena cava and hepatic veins, suggesting an element of right heart failure. 3. No pulmonary emboli.     Echocardiogram - none  ECG: ST, LVH, inferolateral STD and inverted T waves    ASSESSMENT AND PLAN   1. Typical chest pain - negative cardiac enzymes, ECG with inferolateral leads that might be secondary to LVH however suspicious f ischemia. - continue aspirin, statin, BB, hold ACEI as Crea is elevated - we will schedule cath for today  2. Acute CHF - unknown etiology - we will follow echo for systolic and diastolic function - hold Lasix before the cath, negative -1000 since the admission  3. HTN - with significant LVH on ECG, we will await for echo, agree with BB and Imdur  4. Hyperlipidemia - continue atorvastatin  Signed, Dorothy Spark, MD, [facility] 07/05/2014, 9:20 AM

## 2014-07-05 NOTE — CV Procedure (Signed)
    Cardiac Catheterization Procedure Note  Name: Joel Beck MRN: [id number] DOB: 1956-03-03  Procedure: Left Heart Cath, Selective Coronary Angiography, LV angiography  Indication: 58 yo male with new onset CHF with EF 25% and chest pain.   Procedural Details: The right wrist was prepped, draped, and anesthetized with 1% lidocaine. Using the modified Seldinger technique, a 6 French slender sheath was introduced into the right radial artery. 3 mg of verapamil was administered through the sheath, weight-based unfractionated heparin was administered intravenously. Standard Judkins catheters were used for selective coronary angiography and left ventriculography. Catheter exchanges were performed over an exchange length guidewire. There were no immediate procedural complications. A TR band was used for radial hemostasis at the completion of the procedure.  The patient was transferred to the post catheterization recovery area for further monitoring.  Procedural Findings: Hemodynamics: AO 133/96 mean 111 mm Hg LV 130/25 mm Hg  Coronary angiography: Coronary dominance: right  Left mainstem: Normal.  Left anterior descending (LAD): Mild irregularities in the proximal and mid vessel < 20%. Normal diagonal.  Ramus intermediate: large, normal.  Left circumflex (LCx): Normal.  Right coronary artery (RCA): Very large vessel. Normal.  Left ventriculography: Left ventricular systolic function is abnormal, there is severe global hypokinesis and severe LV enlargement. LVEF is estimated at 25%, there is no significant mitral regurgitation   Final Conclusions:   1. No significant CAD 2. Severe LV dysfunction.  Recommendations: Medical management of CHF.  Ameli Sangiovanni [place], [place]  07/05/2014, 4:14 PM

## 2014-07-05 NOTE — Progress Notes (Addendum)
CARDIOLOGY CONSULT NOTE   Patient ID: Joel Beck MRN: [id number], DOB/AGE: 1956/05/22   Admit date: 07/04/2014 Date of Consult: 07/05/2014  Primary Physician: No PCP Per Patient Primary Cardiologist: None  Reason for consult:  Chest pain  Problem List  History reviewed. No pertinent past medical history.  History reviewed. No pertinent past surgical history.   Allergies  No Known Allergies  HPI   Patient is a 58 year old male with no medical history, has not seen any any physicians in the past for regular medical care presented to ER with chief complaint of intermittent chest pain with shortness of breath for last 2-3 weeks. History was obtained from the patient who reported that he has noticed intermittent left-sided chest pain with associated shortness of breath, episodic, worse with exertion, lasting about 15-20 minutes and improved some rest. He admits to orthopnea and paroxysmal nocturnal dyspnea in the last 2 weeks. This has resolved since the admission He's not on any medications. At the time of arrival to the ER, patient's BP was 156/115. Patient also reports intermittent headaches and blurry vision, with chest pains. He denies any pedal edema. Patient also reports off and on "heartburn". He has never smoked. No FH of premature CAD. He complains of lot of stress at home.   Inpatient Medications  . aspirin EC  325 mg Oral Daily  . carvedilol  3.125 mg Oral BID WC  . enoxaparin (LOVENOX) injection  40 mg Subcutaneous Q24H  . isosorbide mononitrate  30 mg Oral Daily  . pantoprazole  40 mg Oral Q0600  . pneumococcal 23 valent vaccine  0.5 mL Intramuscular Tomorrow-1000  . sodium chloride  3 mL Intravenous Q12H    Family History History reviewed. No pertinent family history.   Social History History   Social History  . Marital Status: Married    Spouse Name: N/A    Number of Children: N/A  . Years of Education: N/A   Occupational History  . Not on file.    Social History Main Topics  . Smoking status: Never Smoker   . Smokeless tobacco: Not on file  . Alcohol Use: Yes     Comment: occ  . Drug Use: No  . Sexual Activity: Not on file   Other Topics Concern  . Not on file   Social History Narrative  . No narrative on file    Review of Systems  General:  No chills, fever, night sweats or weight changes.  Cardiovascular:  No chest pain, dyspnea on exertion, edema, orthopnea, palpitations, paroxysmal nocturnal dyspnea. Dermatological: No rash, lesions/masses Respiratory: No cough, dyspnea Urologic: No hematuria, dysuria Abdominal:   No nausea, vomiting, diarrhea, bright red blood per rectum, melena, or hematemesis Neurologic:  No visual changes, wkns, changes in mental status. All other systems reviewed and are otherwise negative except as noted above.  Physical Exam  Blood pressure 122/82, pulse 70, temperature 98.2 F (36.8 C), temperature source Oral, resp. rate 18, height 5\' 10"  (1.778 m), weight 177 lb 1.6 oz (80.332 kg), SpO2 97.00%.  General: Pleasant, NAD Psych: Normal affect. Neuro: Alert and oriented X 3. Moves all extremities spontaneously. HEENT: Normal  Neck: Supple without bruits, JVD + 6 cm. Lungs:  Resp regular and unlabored, mild crackles at bases. Heart: RRR no s3, s4, or murmurs. Abdomen: Soft, non-tender, non-distended, BS + x 4.  Extremities: No clubbing, cyanosis or edema. DP/PT/Radials 2+ and equal bilaterally.  Labs  Recent Labs  07/04/14 1839 07/04/14 2136 07/05/14 0015  TROPONINI <0.30 <0.30 <0.30   Lab Results  Component Value Date   WBC 6.7 07/04/2014   HGB 14.4 07/04/2014   HCT 41.5 07/04/2014   MCV 89.8 07/04/2014   PLT 182 07/04/2014    Recent Labs Lab 07/05/14 0015  NA 140  K 3.9  CL 105  CO2 23  BUN 23  CREATININE 1.36*  CALCIUM 8.5  GLUCOSE 91   Lab Results  Component Value Date   CHOL 171 07/04/2014   HDL 45 07/04/2014   LDLCALC 105* 07/04/2014   TRIG 107 07/04/2014   Lab Results   Component Value Date   DDIMER 1.14* 07/04/2014   Radiology/Studies  Dg Chest 2 View  07/04/2014   CLINICAL DATA:  Short of breath.  Chest pain.  EXAM: CHEST  2 VIEW  COMPARISON:  None.  FINDINGS: Moderate CHF is present with asymmetric pulmonary edema. There is diffuse interstitial edema with basilar predominant alveolar edema. Cardiopericardial silhouette is enlarged. Aortic arch atherosclerosis. No focal consolidation to suggest pneumonia.  IMPRESSION: Moderate CHF.   Electronically Signed   By: Dereck Ligas M.D.   On: 07/04/2014 14:47   Ct Angio Chest Pe W/cm &/or Wo Cm  07/04/2014   CLINICAL DATA:  Left chest pain and some shortness of breath for the past 3 weeks.  IMPRESSION: 1. Cardiomegaly and mild acute congestive heart failure. 2. Reflux contrast into the inferior vena cava and hepatic veins, suggesting an element of right heart failure. 3. No pulmonary emboli.     Echocardiogram - none  ECG: ST, LVH, inferolateral STD and inverted T waves    ASSESSMENT AND PLAN   1. Typical chest pain - negative cardiac enzymes, ECG with inferolateral leads that might be secondary to LVH however suspicious f ischemia. - continue aspirin, statin, BB, hold ACEI as Crea is elevated - we will schedule cath for today  2. Acute CHF - unknown etiology - we will follow echo for systolic and diastolic function - hold Lasix before the cath, negative -1000 since the admission  3. HTN - with significant LVH on ECG, we will await for echo, agree with BB and Imdur  4. Hyperlipidemia - continue atorvastatin  Signed, Dorothy Spark, MD, [facility] 07/05/2014, 9:20 AM

## 2014-07-05 NOTE — Progress Notes (Addendum)
[facility] PROGRESS NOTE  Joel Beck [id number] DOB: 1956-07-04 DOA: 07/04/2014 PCP: No PCP Per Patient  Assessment/Plan: 1. Chest Pain -Patient with no reported past medical history presenting with CP having typical features -Presented hypertensive with blood pressure of 162/115 -Troponins cycled over night remained negative -He was started on beta blocker and Nitrates -Case discussed with cardiology for consultation and I feel he may need further work up possibly with Cath  2. Accelerated Hypertension -He presented with BP of  162/115, complaining of chest pain -Unaware of diagnosis of HTN, EKG showed LVH -Blood pressures improved with the administration of Imdur and Coreg  3.  Acute Congestive Heart Failure, unknown ejection fraction -Initial labs showing an elevated BNP of 1550, with CXR showing evidence of Pulmonary Edema. -Laxis 20 mg IV administered with improvement -Follow up on 2D echo -Cardiology consultation placed, may need Cath  4. GERD -On PPI  5. DVT prophylaxis -Lovenox   Code Status: Full Code Family Communication:  Disposition Plan: Cardiology consult   Consultants:  Cardiology  HPI/Subjective: Patient is a pleasant 58 year old with no reported past medical history who presented to the emergency room at [facility] on 07/04/2014 with complaints of chest pain and associated shortness of breath. He described having these symptoms intermittently over the past 2-3 weeks, precipitated by physical exertion. EKG showed T wave inversions to lateral leads with LVH. CT scan of lungs with IV contrast showed no evidence of pulmonary emboli.He was admitted to the the medicine service for further workup.    Objective: Filed Vitals:   07/05/14 0533  BP: 122/82  Pulse: 70  Temp: 98.2 F (36.8 C)  Resp: 18    Intake/Output Summary (Last 24 hours) at 07/05/14 0816 Last data filed at 07/05/14 0539  Gross per 24 hour  Intake    540 ml  Output    1625 ml  Net  -1085 ml   Filed Weights   07/04/14 1700 07/05/14 0533  Weight: 80.922 kg (178 lb 6.4 oz) 80.332 kg (177 lb 1.6 oz)    Exam:   General:  No acute distress, awake and alert  Cardiovascular: Regular rate and rhythm, normal S1S2  Respiratory: Clear to auscultation bilaterally, normal inspiratory effort  Abdomen: Soft, nontender nondistened  Musculoskeletal: No edema  Data Reviewed: Basic Metabolic Panel:  Recent Labs Lab 07/04/14 1156 07/05/14 0015  NA 142 140  K 4.2 3.9  CL 107 105  CO2 24 23  GLUCOSE 89 91  BUN 17 23  CREATININE 1.29 1.36*  CALCIUM 8.9 8.5   Liver Function Tests: No results found for this basename: AST, ALT, ALKPHOS, BILITOT, PROT, ALBUMIN,  in the last 168 hours No results found for this basename: LIPASE, AMYLASE,  in the last 168 hours No results found for this basename: AMMONIA,  in the last 168 hours CBC:  Recent Labs Lab 07/04/14 1156  WBC 6.7  HGB 14.4  HCT 41.5  MCV 89.8  PLT 182   Cardiac Enzymes:  Recent Labs Lab 07/04/14 1839 07/04/14 2136 07/05/14 0015  TROPONINI <0.30 <0.30 <0.30   BNP (last 3 results)  Recent Labs  07/04/14 1156  PROBNP 1550.0*   CBG: No results found for this basename: GLUCAP,  in the last 168 hours  No results found for this or any previous visit (from the past 240 hour(s)).   Studies: Dg Chest 2 View  07/04/2014   CLINICAL DATA:  Short of breath.  Chest pain.  EXAM: CHEST  2 VIEW  COMPARISON:  None.  FINDINGS: Moderate CHF is present with asymmetric pulmonary edema. There is diffuse interstitial edema with basilar predominant alveolar edema. Cardiopericardial silhouette is enlarged. Aortic arch atherosclerosis. No focal consolidation to suggest pneumonia.  IMPRESSION: Moderate CHF.   Electronically Signed   By: Dereck Ligas M.D.   On: 07/04/2014 14:47   Ct Angio Chest Pe W/cm &/or Wo Cm  07/04/2014   CLINICAL DATA:  Left chest pain and some shortness of breath for the past 3  weeks.  EXAM: CT ANGIOGRAPHY CHEST WITH CONTRAST  TECHNIQUE: Multidetector CT imaging of the chest was performed using the standard protocol during bolus administration of intravenous contrast. Multiplanar CT image reconstructions and MIPs were obtained to evaluate the vascular anatomy.  CONTRAST:  73mL OMNIPAQUE IOHEXOL 350 MG/ML SOLN  COMPARISON:  Chest radiographs obtained earlier today.  FINDINGS: There is reflux of contrast into the inferior vena cava and hepatic veins. The heart is mildly enlarged with normal sized right atrium and ventricle and moderately dilated left ventricle and left atrium. The pulmonary arteries are normally opacified with no pulmonary arterial filling defects seen. The pulmonary vasculature remains prominent. Mild interstitial pulmonary edema is again demonstrated with peripheral Kerley lines. No lung nodules or enlarged lymph nodes. Mild thoracic and upper lumbar spine degenerative changes. Small right lobe liver cyst.  Review of the MIP images confirms the above findings.  IMPRESSION: 1. Cardiomegaly and mild acute congestive heart failure. 2. Reflux contrast into the inferior vena cava and hepatic veins, suggesting an element of right heart failure. 3. No pulmonary emboli.   Electronically Signed   By: Enrique Sack M.D.   On: 07/04/2014 15:23    Scheduled Meds: . aspirin EC  325 mg Oral Daily  . carvedilol  3.125 mg Oral BID WC  . enoxaparin (LOVENOX) injection  40 mg Subcutaneous Q24H  . isosorbide mononitrate  30 mg Oral Daily  . pantoprazole  40 mg Oral Q0600  . pneumococcal 23 valent vaccine  0.5 mL Intramuscular Tomorrow-1000  . sodium chloride  3 mL Intravenous Q12H   Continuous Infusions:   Principal Problem:   Chest pain Active Problems:   Acute CHF   Accelerated hypertension   GERD (gastroesophageal reflux disease)    Time spent: 30 min    Kelvin Cellar  [facility] Pager [phone]. If 7PM-7AM, please contact night-coverage at  [email or web address], password [facility] 07/05/2014, 8:16 AM  LOS: 1 day

## 2014-07-06 DIAGNOSIS — I5021 Acute systolic (congestive) heart failure: Principal | ICD-10-CM

## 2014-07-06 LAB — BASIC METABOLIC PANEL
Anion gap: 11 (ref 5–15)
BUN: 25 mg/dL — AB (ref 6–23)
CHLORIDE: 107 meq/L (ref 96–112)
CO2: 23 meq/L (ref 19–32)
CREATININE: 1.27 mg/dL (ref 0.50–1.35)
Calcium: 8.5 mg/dL (ref 8.4–10.5)
GFR calc Af Amer: 71 mL/min — ABNORMAL LOW (ref >90)
GFR calc non Af Amer: 62 mL/min — ABNORMAL LOW (ref >90)
Glucose, Bld: 90 mg/dL (ref 70–99)
Potassium: 3.8 mEq/L (ref 3.7–5.3)
Sodium: 141 mEq/L (ref 137–147)

## 2014-07-06 MED ORDER — SPIRONOLACTONE 12.5 MG HALF TABLET
12.5000 mg | ORAL_TABLET | Freq: Every day | ORAL | Status: DC
  Administered 2014-07-06 – 2014-07-07 (×2): 12.5 mg via ORAL
  Filled 2014-07-06 (×2): qty 1

## 2014-07-06 MED ORDER — LISINOPRIL 5 MG PO TABS
5.0000 mg | ORAL_TABLET | Freq: Every day | ORAL | Status: DC
  Administered 2014-07-06 – 2014-07-07 (×2): 5 mg via ORAL
  Filled 2014-07-06 (×2): qty 1

## 2014-07-06 NOTE — Clinical Documentation Improvement (Signed)
PLEASE SPECIFY THE TYPE OF CHF Possible Clinical Conditions?  Chronic Systolic Congestive Heart Failure Chronic Diastolic Congestive Heart Failure Chronic Systolic & Diastolic Congestive Heart Failure Acute Systolic Congestive Heart Failure Acute Diastolic Congestive Heart Failure Acute Systolic & Diastolic Congestive Heart Failure Acute on Chronic Systolic Congestive Heart Failure Acute on Chronic Diastolic Congestive Heart Failure Acute on Chronic Systolic & Diastolic Congestive Heart Failure Other Condition  Cannot Clinically Determine  Supporting Information:(As per notes)"Acute CHF - unknown etiology- we will follow echo for systolic and diastolic function  Thank You, Alessandra Grout, RN, BSN, CCDS,Clinical Documentation Specialist:  [phone]  [phone]=[employer]- [facility] Information Management

## 2014-07-06 NOTE — Progress Notes (Signed)
Patient Name: Joel Beck Date of Encounter: 07/06/2014     Principal Problem:   Chest pain Active Problems:   Acute CHF   Accelerated hypertension   GERD (gastroesophageal reflux disease)   Acute systolic CHF (congestive heart failure)    SUBJECTIVE  Anxious to leave. Feeling great. No CP or SOB.   CURRENT MEDS . aspirin EC  325 mg Oral Daily  . carvedilol  3.125 mg Oral BID WC  . enoxaparin (LOVENOX) injection  40 mg Subcutaneous Q24H  . isosorbide mononitrate  30 mg Oral Daily  . lisinopril  5 mg Oral Daily  . pantoprazole  40 mg Oral Q0600  . sodium chloride  3 mL Intravenous Q12H    OBJECTIVE  Filed Vitals:   07/06/14 0430 07/06/14 0515 07/06/14 0629 07/06/14 0949  BP: 143/108 139/102 138/102 140/99  Pulse: 87 84 85   Temp: 98.2 F (36.8 C)     TempSrc: Oral     Resp: 20     Height:      Weight: 178 lb 12.8 oz (81.103 kg)     SpO2: 99%       Intake/Output Summary (Last 24 hours) at 07/06/14 1406 Last data filed at 07/06/14 1256  Gross per 24 hour  Intake    960 ml  Output    675 ml  Net    285 ml   Filed Weights   07/04/14 1700 07/05/14 0533 07/06/14 0430  Weight: 178 lb 6.4 oz (80.922 kg) 177 lb 1.6 oz (80.332 kg) 178 lb 12.8 oz (81.103 kg)    PHYSICAL EXAM  General: Pleasant, NAD. Neuro: Alert and oriented X 3. Moves all extremities spontaneously. Psych: Normal affect. HEENT:  Normal  Neck: Supple without bruits or JVD. Lungs:  Resp regular and unlabored, CTA. Heart: RRR no s3, s4, or murmurs. Abdomen: Soft, non-tender, non-distended, BS + x 4.  Extremities: No clubbing, cyanosis or edema. DP/PT/Radials 2+ and equal bilaterally.  Accessory Clinical Findings  CBC  Recent Labs  07/04/14 1156 07/05/14 1845  WBC 6.7 7.0  HGB 14.4 13.1  HCT 41.5 37.9*  MCV 89.8 90.5  PLT 182 [id number]   Basic Metabolic Panel  Recent Labs  07/05/14 0015 07/05/14 1845 07/06/14 0420  NA 140  --  141  K 3.9  --  3.8  CL 105  --  107  CO2 23  --   23  GLUCOSE 91  --  90  BUN 23  --  25*  CREATININE 1.36* 1.35 1.27  CALCIUM 8.5  --  8.5   Cardiac Enzymes  Recent Labs  07/04/14 1839 07/04/14 2136 07/05/14 0015  TROPONINI <0.30 <0.30 <0.30   BNP (last 3 results)  Recent Labs  07/04/14 1156  PROBNP 1550.0*    D-Dimer  Recent Labs  07/04/14 1321  DDIMER 1.14*   Hemoglobin A1C  Fasting Lipid Panel  Recent Labs  07/04/14 1839  CHOL 171  HDL 45  LDLCALC 105*  TRIG 107  CHOLHDL 3.8     TELE  NSR few PVCs Radiology/Studies  Dg Chest 2 View  07/04/2014   CLINICAL DATA:  Short of breath.  Chest pain.  EXAM: CHEST  2 VIEW  COMPARISON:  None.  FINDINGS: Moderate CHF is present with asymmetric pulmonary edema. There is diffuse interstitial edema with basilar predominant alveolar edema. Cardiopericardial silhouette is enlarged. Aortic arch atherosclerosis. No focal consolidation to suggest pneumonia.  IMPRESSION: Moderate CHF.   Electronically Signed   By: Cay Schillings  Lamke M.D.   On: 07/04/2014 14:47   Ct Angio Chest Pe W/cm &/or Wo Cm  07/04/2014   CLINICAL DATA:  Left chest pain and some shortness of breath for the past 3 weeks.  EXAM: CT ANGIOGRAPHY CHEST WITH CONTRAST  TECHNIQUE: Multidetector CT imaging of the chest was performed using the standard protocol during bolus administration of intravenous contrast. Multiplanar CT image reconstructions and MIPs were obtained to evaluate the vascular anatomy.  CONTRAST:  16mL OMNIPAQUE IOHEXOL 350 MG/ML SOLN  COMPARISON:  Chest radiographs obtained earlier today.  FINDINGS: There is reflux of contrast into the inferior vena cava and hepatic veins. The heart is mildly enlarged with normal sized right atrium and ventricle and moderately dilated left ventricle and left atrium. The pulmonary arteries are normally opacified with no pulmonary arterial filling defects seen. The pulmonary vasculature remains prominent. Mild interstitial pulmonary edema is again demonstrated with  peripheral Kerley lines. No lung nodules or enlarged lymph nodes. Mild thoracic and upper lumbar spine degenerative changes. Small right lobe liver cyst.  Review of the MIP images confirms the above findings.  IMPRESSION: 1. Cardiomegaly and mild acute congestive heart failure. 2. Reflux contrast into the inferior vena cava and hepatic veins, suggesting an element of right heart failure. 3. No pulmonary emboli.   Electronically Signed   By: Enrique Sack M.D.   On: 07/04/2014 15:23    Study Date: 07/05/2014 LV EF: 25% Study Conclusions - Left ventricle: High sphericity index. The cavity size was severely dilated. Wall thickness was normal. The estimated ejection fraction was 25%. Diffuse hypokinesis. Doppler parameters are consistent with both elevated ventricular end-diastolic filling pressure and elevated left atrial filling pressure. - Aortic valve: There was trivial regurgitation. - Mitral valve: There was mild regurgitation. - Left atrium: The atrium was moderately dilated. - Atrial septum: No defect or patent foramen ovale was identified.   ASSESSMENT AND PLAN Patient is a 58 year old male with no medical history, has not seen any any physicians in the past for regular medical care presented to ED on 07/04/14 with chief complaint of intermittent chest pain with shortness of breath for last 2-3 weeks  Typical chest pain - negative cardiac enzymes, ECG with inferolateral leads that might be secondary to LVH however suspicious of ischemia. 2D ECHO with new low EF. Underwent LHC yesterday which revealed   1. No significant CAD   2. Severe LV dysfunction.   Recommendations: Medical management of CHF.  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 Diffuse hypokinesis. Doppler w/ both elevated LDEDP and elevated left atrial filling pressure. -- Net Neg 1.1L with IV Lasix. Held for cath yesterday  -- Continue BB coreg 3.125 mg BID and  lisinopril 5mg  -- Unknown etiology. UDS neg. Denies alcohol abuse. Has done cocaine remotely.  -- May need life vest at discharge. MD to see.   HTN - with significant LVH on ECG, we will await for echo, agree with BB, ACEi and Imdur   Hyperlipidemia - continue atorvastatin  AKI- improved. 1.36--> 1.25.  Tyrell Antonio PA-C  Pager [phone]    Patient seen and examined. Agree with assessment and plan. Feeling much better. NICM with EF 25%. Now on low doswe ACE-I and coreg. Will need further titration as outpatient. Will also add low dose aldosterone blockade with spironolactone 12.5 mg daily and increase to bid if renal and K stable. Recommend life-vest prior to dc with hopeful improvement in LV fxn  with increasing medical therapy.   Troy Sine, MD, [facility] 07/06/2014 3:31 PM

## 2014-07-06 NOTE — Progress Notes (Signed)
CARDIAC REHAB PHASE I   PRE:  Rate/Rhythm: 83SR  BP:  Supine:   Sitting: 130/90  Standing:    SaO2: 98%RA  MODE:  Ambulation: 460 ft   POST:  Rate/Rhythm: 92  BP:  Supine:   Sitting:   Standing: 140/102   SaO2: 93%RA 1435-1515 Pt walked 460 ft on RA with steady gait. No CP or SOB. Pt stated he is ready to go home. Discussed with pt what CHF is and importance of daily weights. He stated he has scales at home and could repeat to call MD if he gains 3 pounds overnight or 5 in week. Discussed watching sodium. Gave low sodium diet sheets and ex ed. Pt voiced understanding but stated that his wife and son read English better than he did. Discussed what a life vest was because he had questions about it and the pumping of his heart.  Pt will need re enforcement by staff and family.   Graylon Good, RN BSN  07/06/2014 3:13 PM

## 2014-07-06 NOTE — Plan of Care (Signed)
Problem: Phase I Progression Outcomes Goal: EF % per last Echo/documented,Core Reminder form on chart Outcome: Completed/Met Date Met:  07/06/14 EF 25% done by ECHO in 2015.

## 2014-07-06 NOTE — Progress Notes (Signed)
[facility] PROGRESS NOTE  Joel Beck [id number] DOB: 02-19-1956 DOA: 07/04/2014 PCP: No PCP Per Patient Interim Summary Patient is a pleasant 58 year old with no reported past medical history who presented to the emergency room at [facility] on 07/04/2014 with complaints of chest pain and associated shortness of breath. He described having these symptoms intermittently over the past 2-3 weeks, precipitated by physical exertion. EKG showed T wave inversions to lateral leads with LVH. CT scan of lungs with IV contrast showed no evidence of pulmonary emboli. He was admitted to the the medicine service for further workup. He was started on IV Lasix and by the following morning reported significant improvement to his shortness of breath. A transthoracic echocardiogram showed an ejection fraction of 25% with diffuse hypokinesis. Cardiology was consulted and taken to the Cath Lab on 07/05/2014. Cardiac catheterization revealed nonobstructive coronary artery disease with severe global hypokinesis and severe left ventricular enlargement, estimated LVEF at 25%. He was started on Coreg, lisinopril, Lasix and spironolactone. cardiology recommending placement of life vest. Anticipate discharge in the next 24 hours.                                                                                                                                              Assessment/Plan: 1. Chest Pain -Patient with no reported past medical history presenting with CP having typical features -Presented hypertensive with blood pressure of 162/115 -Troponins cycled over night remained negative -Cardiac catheterization showing nonobstructive coronary artery disease, chest pain likely secondary to acute decompensated congestive heart failure  2. Acute systolic congestive heart failure, dilated cardiomyopathy -Patient having a left ventricular ejection fraction of 25% with severe left ventricular enlargement -Continue  Coreg, lisinopril, Lasix and spironolactone -Unclear etiology, possibilities include hypertension, history of alcohol abuse, remote history of cocaine abuse. Will check HIV screen -Patient at increased risk for developing life threatening arrhythmias, cardiology recommending life vest prior to discharge  3. Accelerated Hypertension -He presented with BP of  162/115, complaining of chest pain -Unaware of diagnosis of HTN, EKG showed LVH -Blood pressures improved with the administration of Imdur, Coreg and lisinopril  4. GERD -On PPI  5. DVT prophylaxis -Lovenox   Code Status: Full Code Family Communication:  Disposition Plan: Anticipate discharge in the next 24 hours   Consultants:  Cardiology  HPI/Subjective: He reports feeling better today, with resolution to his shortness of breath. Presently denies CP, nausea, vomiting, tolerating PO intake.   Objective: Filed Vitals:   07/06/14 1528  BP: 128/92  Pulse: 86  Temp: 98.1 F (36.7 C)  Resp: 18    Intake/Output Summary (Last 24 hours) at 07/06/14 1624 Last data filed at 07/06/14 1534  Gross per 24 hour  Intake    960 ml  Output    800 ml  Net    160 ml   [employer]  07/04/14 1700 07/05/14 0533 07/06/14 0430  Weight: 80.922 kg (178 lb 6.4 oz) 80.332 kg (177 lb 1.6 oz) 81.103 kg (178 lb 12.8 oz)    Exam:   General:  No acute distress, awake and alert  Cardiovascular: Regular rate and rhythm, normal S1S2  Respiratory: Clear to auscultation bilaterally, normal inspiratory effort  Abdomen: Soft, nontender nondistened  Musculoskeletal: No edema  Data Reviewed: Basic Metabolic Panel:  Recent Labs Lab 07/04/14 1156 07/05/14 0015 07/05/14 1845 07/06/14 0420  NA 142 140  --  141  K 4.2 3.9  --  3.8  CL 107 105  --  107  CO2 24 23  --  23  GLUCOSE 89 91  --  90  BUN 17 23  --  25*  CREATININE 1.29 1.36* 1.35 1.27  CALCIUM 8.9 8.5  --  8.5   Liver Function Tests: No results found for this  basename: AST, ALT, ALKPHOS, BILITOT, PROT, ALBUMIN,  in the last 168 hours No results found for this basename: LIPASE, AMYLASE,  in the last 168 hours No results found for this basename: AMMONIA,  in the last 168 hours CBC:  Recent Labs Lab 07/04/14 1156 07/05/14 1845  WBC 6.7 7.0  HGB 14.4 13.1  HCT 41.5 37.9*  MCV 89.8 90.5  PLT 182 182   Cardiac Enzymes:  Recent Labs Lab 07/04/14 1839 07/04/14 2136 07/05/14 0015  TROPONINI <0.30 <0.30 <0.30   BNP (last 3 results)  Recent Labs  07/04/14 1156  PROBNP 1550.0*   CBG: No results found for this basename: GLUCAP,  in the last 168 hours  No results found for this or any previous visit (from the past 240 hour(s)).   Studies: No results found.  Scheduled Meds: . aspirin EC  325 mg Oral Daily  . carvedilol  3.125 mg Oral BID WC  . enoxaparin (LOVENOX) injection  40 mg Subcutaneous Q24H  . isosorbide mononitrate  30 mg Oral Daily  . lisinopril  5 mg Oral Daily  . pantoprazole  40 mg Oral Q0600  . sodium chloride  3 mL Intravenous Q12H  . spironolactone  12.5 mg Oral Daily   Continuous Infusions:   Principal Problem:   Chest pain Active Problems:   Acute CHF   Accelerated hypertension   GERD (gastroesophageal reflux disease)   Acute systolic CHF (congestive heart failure)    Time spent: 25 min    Kelvin Cellar  [facility] Pager [phone]. If 7PM-7AM, please contact night-coverage at [email or web address], password [facility] 07/06/2014, 4:24 PM  LOS: 2 days

## 2014-07-06 NOTE — Progress Notes (Signed)
Patient with elevated BP this morning.  Coreg given early with little effect. NP on call made aware. Awaiting new orders. RN will continue to monitor. Shellee Milo, RN

## 2014-07-07 LAB — BASIC METABOLIC PANEL
Anion gap: 14 (ref 5–15)
BUN: 17 mg/dL (ref 6–23)
CHLORIDE: 108 meq/L (ref 96–112)
CO2: 23 meq/L (ref 19–32)
CREATININE: 1.28 mg/dL (ref 0.50–1.35)
Calcium: 8.9 mg/dL (ref 8.4–10.5)
GFR calc Af Amer: 71 mL/min — ABNORMAL LOW (ref >90)
GFR calc non Af Amer: 61 mL/min — ABNORMAL LOW (ref >90)
Glucose, Bld: 90 mg/dL (ref 70–99)
Potassium: 4 mEq/L (ref 3.7–5.3)
Sodium: 145 mEq/L (ref 137–147)

## 2014-07-07 LAB — PRO B NATRIURETIC PEPTIDE: Pro B Natriuretic peptide (BNP): 1397 pg/mL — ABNORMAL HIGH (ref 0–125)

## 2014-07-07 LAB — HIV ANTIBODY (ROUTINE TESTING W REFLEX): HIV 1&2 Ab, 4th Generation: NONREACTIVE

## 2014-07-07 MED ORDER — ISOSORBIDE MONONITRATE ER 30 MG PO TB24: 30.0000 mg | ORAL_TABLET | Freq: Every day | ORAL | Status: DC

## 2014-07-07 MED ORDER — LISINOPRIL 5 MG PO TABS
5.0000 mg | ORAL_TABLET | Freq: Every day | ORAL | Status: DC
Start: 2014-07-07 — End: 2014-07-28

## 2014-07-07 MED ORDER — FAMOTIDINE 20 MG PO TABS: 20.0000 mg | ORAL_TABLET | Freq: Every day | ORAL | Status: DC

## 2014-07-07 MED ORDER — SPIRONOLACTONE 12.5 MG HALF TABLET
12.5000 mg | ORAL_TABLET | Freq: Two times a day (BID) | ORAL | Status: DC
Start: 2014-07-07 — End: 2014-07-23

## 2014-07-07 MED ORDER — ASPIRIN EC 81 MG PO TBEC: 81.0000 mg | DELAYED_RELEASE_TABLET | Freq: Every day | ORAL | Status: DC

## 2014-07-07 MED ORDER — CARVEDILOL 3.125 MG PO TABS: 3.1250 mg | ORAL_TABLET | Freq: Two times a day (BID) | ORAL | Status: DC

## 2014-07-07 MED ORDER — SPIRONOLACTONE 12.5 MG HALF TABLET
12.5000 mg | ORAL_TABLET | Freq: Two times a day (BID) | ORAL | Status: DC
  Administered 2014-07-07: 12.5 mg via ORAL
  Filled 2014-07-07 (×2): qty 1

## 2014-07-07 NOTE — Progress Notes (Signed)
CARDIAC REHAB PHASE I   PRE:  Rate/Rhythm: 76 SR  BP:  Supine:   Sitting: 110/88  Standing:    SaO2: 93%RA  MODE:  Ambulation: 920 ft   POST:  Rate/Rhythm: 80 SR  BP:  Supine:   Sitting: 110/90  Standing:    SaO2: 97%RA 1501-1517 Came to see earlier but pt needed to fill out Medicaid papers. Returned to walk with pt. He walked 920 ft with steady gait. No DOE noted. Pt remembered that he is to call MD with weight gain of 3 pounds overnight or 5 pounds in week. Knows he is to watch sodium and to begin walking for exercise. Put on life vest video for pt to view.   Graylon Good, RN BSN  07/07/2014 3:13 PM

## 2014-07-07 NOTE — Progress Notes (Signed)
CSW order received due to patient not having any insurance. CSW left message for [place] Counselor for follow up. Also- per [facility]- patient will require a Life Vest at d/c- notified [place]- Director of [place] Work re: need to assist with a Lease Agreement for the [place] until Medicaid can be put into place.  No further CSW needs indicated. CSW signing off. Will be available to assist as needed in the future.    Lorie Phenix. Pauline Good, [place]

## 2014-07-07 NOTE — Progress Notes (Signed)
[facility] PROGRESS NOTE  Joel Beck [id number] DOB: 05/27/1956 DOA: 07/04/2014 PCP: No PCP Per Patient Interim Summary Patient is a pleasant 58 year old with no reported past medical history who presented to the emergency room at [facility] on 07/04/2014 with complaints of chest pain and associated shortness of breath. He described having these symptoms intermittently over the past 2-3 weeks, precipitated by physical exertion. EKG showed T wave inversions to lateral leads with LVH. CT scan of lungs with IV contrast showed no evidence of pulmonary emboli. He was admitted to the the medicine service for further workup. He was started on IV Lasix and by the following morning reported significant improvement to his shortness of breath. A transthoracic echocardiogram showed an ejection fraction of 25% with diffuse hypokinesis. Cardiology was consulted and taken to the Cath Lab on 07/05/2014. Cardiac catheterization revealed nonobstructive coronary artery disease with severe global hypokinesis and severe left ventricular enlargement, estimated LVEF at 25%. He was started on Coreg, lisinopril, Lasix and spironolactone. cardiology recommending placement of life vest. Anticipate discharge in the next 24 hours.                                                                                                                                              Assessment/Plan: 1. Chest Pain -Patient with no reported past medical history presenting with CP having typical features -Presented hypertensive with blood pressure of 162/115 -Troponins cycled over night remained negative -Cardiac catheterization showing nonobstructive coronary artery disease, chest pain likely secondary to acute decompensated congestive heart failure -chest pain resolved.   2. Acute systolic congestive heart failure, dilated cardiomyopathy -Patient having a left ventricular ejection fraction of 25% with severe left ventricular  enlargement -Continue Coreg, lisinopril, spironolactone -Unclear etiology, possibilities include hypertension, history of alcohol abuse, remote history of cocaine abuse. HIV screen negative.  -Patient at increased risk for developing life threatening arrhythmias, cardiology recommending life vest prior to discharge. Discharge home later today if he get life vest.   3. Accelerated Hypertension -He presented with BP of  162/115, complaining of chest pain -Unaware of diagnosis of HTN, EKG showed LVH -Blood pressures improved with the administration of Imdur, Coreg and lisinopril  4. GERD -On PPI  5. DVT prophylaxis -Lovenox   Code Status: Full Code Family Communication: care discussed with patient.  Disposition Plan: Anticipate discharge in the next 24 hours   Consultants:  Cardiology  HPI/Subjective: He is feeling better. He wants to go home.   Objective: Filed Vitals:   07/07/14 0603  BP: 132/97  Pulse: 80  Temp:   Resp:     Intake/Output Summary (Last 24 hours) at 07/07/14 1509 Last data filed at 07/07/14 1335  Gross per 24 hour  Intake    840 ml  Output    950 ml  Net   -110 ml   Filed  Weights   07/05/14 0533 07/06/14 0430 07/07/14 0538  Weight: 80.332 kg (177 lb 1.6 oz) 81.103 kg (178 lb 12.8 oz) 81.149 kg (178 lb 14.4 oz)    Exam:   General:  No acute distress, awake and alert  Cardiovascular: Regular rate and rhythm, normal S1S2  Respiratory: Clear to auscultation bilaterally, normal inspiratory effort  Abdomen: Soft, nontender nondistened  Musculoskeletal: No edema  Data Reviewed: Basic Metabolic Panel:  Recent Labs Lab 07/04/14 1156 07/05/14 0015 07/05/14 1845 07/06/14 0420 07/07/14 0341  NA 142 140  --  141 145  K 4.2 3.9  --  3.8 4.0  CL 107 105  --  107 108  CO2 24 23  --  23 23  GLUCOSE 89 91  --  90 90  BUN 17 23  --  25* 17  CREATININE 1.29 1.36* 1.35 1.27 1.28  CALCIUM 8.9 8.5  --  8.5 8.9   Liver Function Tests: No  results found for this basename: AST, ALT, ALKPHOS, BILITOT, PROT, ALBUMIN,  in the last 168 hours No results found for this basename: LIPASE, AMYLASE,  in the last 168 hours No results found for this basename: AMMONIA,  in the last 168 hours CBC:  Recent Labs Lab 07/04/14 1156 07/05/14 1845  WBC 6.7 7.0  HGB 14.4 13.1  HCT 41.5 37.9*  MCV 89.8 90.5  PLT 182 182   Cardiac Enzymes:  Recent Labs Lab 07/04/14 1839 07/04/14 2136 07/05/14 0015  TROPONINI <0.30 <0.30 <0.30   BNP (last 3 results)  Recent Labs  07/04/14 1156 07/07/14 0341  PROBNP 1550.0* 1397.0*   CBG: No results found for this basename: GLUCAP,  in the last 168 hours  No results found for this or any previous visit (from the past 240 hour(s)).   Studies: No results found.  Scheduled Meds: . aspirin EC  325 mg Oral Daily  . carvedilol  3.125 mg Oral BID WC  . enoxaparin (LOVENOX) injection  40 mg Subcutaneous Q24H  . isosorbide mononitrate  30 mg Oral Daily  . lisinopril  5 mg Oral Daily  . pantoprazole  40 mg Oral Q0600  . sodium chloride  3 mL Intravenous Q12H  . spironolactone  12.5 mg Oral BID   Continuous Infusions:   Principal Problem:   Chest pain Active Problems:   Acute CHF   Accelerated hypertension   GERD (gastroesophageal reflux disease)   Acute systolic CHF (congestive heart failure)    Time spent: 25 min    Bradi Arbuth[place], [facility] Pager [phone]. If 7PM-7AM, please contact night-coverage at [email or web address], password [facility] 07/07/2014, 3:09 PM  LOS: 3 days

## 2014-07-07 NOTE — Progress Notes (Signed)
Pt given DC instruction and re educated about heart failure.  Pt life vest rang off saying to call service number.  Service number called and code [id number] displayed.  RN was instructed to take battery out and reboot.  Instructions carried out and life vest working properly when pt was discharged.

## 2014-07-07 NOTE — Progress Notes (Signed)
Patient Name: Joel Beck Date of Encounter: 07/07/2014     Principal Problem:   Chest pain Active Problems:   Acute CHF   Accelerated hypertension   GERD (gastroesophageal reflux disease)   Acute systolic CHF (congestive heart failure)    SUBJECTIVE  Anxious to leave. Feeling great. No CP or SOB.   CURRENT MEDS . aspirin EC  325 mg Oral Daily  . carvedilol  3.125 mg Oral BID WC  . enoxaparin (LOVENOX) injection  40 mg Subcutaneous Q24H  . isosorbide mononitrate  30 mg Oral Daily  . lisinopril  5 mg Oral Daily  . pantoprazole  40 mg Oral Q0600  . sodium chloride  3 mL Intravenous Q12H  . spironolactone  12.5 mg Oral Daily    OBJECTIVE  Filed Vitals:   07/06/14 1528 07/06/14 2212 07/07/14 0538 07/07/14 0603  BP: 128/92 132/90 143/100 132/97  Pulse: 86 90 87 80  Temp: 98.1 F (36.7 C) 98.9 F (37.2 C) 98.5 F (36.9 C)   TempSrc: Oral Oral Oral   Resp: 18 18 20    Height:      Weight:   178 lb 14.4 oz (81.149 kg)   SpO2: 100% 98% 98%     Intake/Output Summary (Last 24 hours) at 07/07/14 0846 Last data filed at 07/07/14 0817  Gross per 24 hour  Intake    900 ml  Output    925 ml  Net    -25 ml   Filed Weights   07/05/14 0533 07/06/14 0430 07/07/14 0538  Weight: 177 lb 1.6 oz (80.332 kg) 178 lb 12.8 oz (81.103 kg) 178 lb 14.4 oz (81.149 kg)    PHYSICAL EXAM  General: Pleasant, NAD. Neuro: Alert and oriented X 3. Moves all extremities spontaneously. Psych: Normal affect. HEENT:  Normal  Neck: Supple without bruits or JVD. Lungs:  Resp regular and unlabored, decreased breath sounds at bases.  Heart: RRR no s3, s4, or murmurs. Abdomen: Soft, non-tender, non-distended, BS + x 4.  Extremities: No clubbing, cyanosis or edema. DP/PT/Radials 2+ and equal bilaterally.  Accessory Clinical Findings  CBC  Recent Labs  07/04/14 1156 07/05/14 1845  WBC 6.7 7.0  HGB 14.4 13.1  HCT 41.5 37.9*  MCV 89.8 90.5  PLT 182 [id number]   Basic Metabolic  Panel  Recent Labs  07/06/14 0420 07/07/14 0341  NA 141 145  K 3.8 4.0  CL 107 108  CO2 23 23  GLUCOSE 90 90  BUN 25* 17  CREATININE 1.27 1.28  CALCIUM 8.5 8.9   Cardiac Enzymes  Recent Labs  07/04/14 1839 07/04/14 2136 07/05/14 0015  TROPONINI <0.30 <0.30 <0.30   BNP (last 3 results)  Recent Labs  07/04/14 1156 07/07/14 0341  PROBNP 1550.0* 1397.0*    D-Dimer  Recent Labs  07/04/14 1321  DDIMER 1.14*   Hemoglobin A1C  Fasting Lipid Panel  Recent Labs  07/04/14 1839  CHOL 171  HDL 45  LDLCALC 105*  TRIG 107  CHOLHDL 3.8     TELE  NSR few PVCs Radiology/Studies  Dg Chest 2 View  07/04/2014   CLINICAL DATA:  Short of breath.  Chest pain.  EXAM: CHEST  2 VIEW  COMPARISON:  None.  FINDINGS: Moderate CHF is present with asymmetric pulmonary edema. There is diffuse interstitial edema with basilar predominant alveolar edema. Cardiopericardial silhouette is enlarged. Aortic arch atherosclerosis. No focal consolidation to suggest pneumonia.  IMPRESSION: Moderate CHF.   Electronically Signed   By: Dereck Ligas  M.D.   On: 07/04/2014 14:47   Ct Angio Chest Pe W/cm &/or Wo Cm  07/04/2014   CLINICAL DATA:  Left chest pain and some shortness of breath for the past 3 weeks.  EXAM: CT ANGIOGRAPHY CHEST WITH CONTRAST  TECHNIQUE: Multidetector CT imaging of the chest was performed using the standard protocol during bolus administration of intravenous contrast. Multiplanar CT image reconstructions and MIPs were obtained to evaluate the vascular anatomy.  CONTRAST:  88mL OMNIPAQUE IOHEXOL 350 MG/ML SOLN  COMPARISON:  Chest radiographs obtained earlier today.  FINDINGS: There is reflux of contrast into the inferior vena cava and hepatic veins. The heart is mildly enlarged with normal sized right atrium and ventricle and moderately dilated left ventricle and left atrium. The pulmonary arteries are normally opacified with no pulmonary arterial filling defects seen. The  pulmonary vasculature remains prominent. Mild interstitial pulmonary edema is again demonstrated with peripheral Kerley lines. No lung nodules or enlarged lymph nodes. Mild thoracic and upper lumbar spine degenerative changes. Small right lobe liver cyst.  Review of the MIP images confirms the above findings.  IMPRESSION: 1. Cardiomegaly and mild acute congestive heart failure. 2. Reflux contrast into the inferior vena cava and hepatic veins, suggesting an element of right heart failure. 3. No pulmonary emboli.      Study Date: 07/05/2014 LV EF: 25% Study Conclusions - Left ventricle: High sphericity index. The cavity size was severely dilated. Wall thickness was normal. The estimated ejection fraction was 25%. Diffuse hypokinesis. Doppler parameters are consistent with both elevated ventricular end-diastolic filling pressure and elevated left atrial filling pressure. - Aortic valve: There was trivial regurgitation. - Mitral valve: There was mild regurgitation. - Left atrium: The atrium was moderately dilated. - Atrial septum: No defect or patent foramen ovale was identified.   ASSESSMENT AND PLAN Patient is a 58 year old male with no medical history, has not seen any any physicians in the past for regular medical care presented to ED on 07/04/14 with chief complaint of intermittent chest pain with shortness of breath for last 2-3 weeks  Typical chest pain - negative cardiac enzymes, ECG with inferolateral leads that might be secondary to LVH however suspicious of ischemia. 2D ECHO with new low EF. Underwent LHC yesterday which revealed   1. No significant CAD   2. Severe LV dysfunction.   Recommendations: Medical management of CHF.  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 Diffuse hypokinesis. Doppler w/ both elevated LDEDP and elevated left atrial filling pressure. -- BNP 1550--> 1397. Still slightly elevated, but patient not SOB and  does not appear to be vol overloaded on exam -- Net Neg 1.1L with IV Lasix. Held for cath yesterday  -- Continue BB coreg 3.125 mg BID and lisinopril 5mg . Will need further titration as outpatient.  -- Added low dose aldosterone blockade with spironolactone 12.5 mg daily yesterday. K stable at 4 and creat 1.28. May be able to titrate up. MD to see.  -- Unknown etiology. UDS neg. Denies alcohol abuse. HIV titer neg. Has done cocaine remotely.  -- Recommend lifevest - I have contacted rep to come see. He will need a re-assesment of LV function in 3 months for evaluation of need for ICD.  HTN - with significant LVH on ECG, we will await for echo, agree with BB, ACEi and Imdur   Hyperlipidemia - continue atorvastatin  AKI- improved. 1.36--> 1.25-->1.28.  Dispo- he has no insurance. He needs social work  consult about medicaid or orange card   Signed, Perry Mount PA-C  Pager [phone]   Patient seen and examined. Agree with assessment and plan.  Seen by Life-vest rep; to have life-vest placed today prior to potential dc. Pt feels better with addition of spironolactone; will change to 12.5 mg bid.   Troy Sine, MD, [facility] 07/07/2014 1:41 PM

## 2014-07-07 NOTE — Discharge Summary (Signed)
Physician Discharge Summary  RAYMOUND KATICH DXA:[id number] DOB: 07-Oct-1956 DOA: 07/04/2014  PCP: No PCP Per Patient  Admit date: 07/04/2014 Discharge date: 07/07/2014  Time spent: 35 minutes  Recommendations for Outpatient Follow-up:  1. Needs B-met to follow potassium level and renal function, patient on spironolactone and ACE.  2. Needs to follow up with cardio for further titration of medications for heart failure.  3. Needs ECHO in 3 months.   Discharge Diagnoses:    Acute systolic CHF (congestive heart failure)   New diagnosis non ischemic cardiomyopathy.    Chest pain resolved, in setting heart failure exacerbation.    Accelerated hypertension   GERD (gastroesophageal reflux disease)    Discharge Condition: Stable.   Diet recommendation: Heart Healthy  Filed Weights   07/05/14 0533 07/06/14 0430 07/07/14 0538  Weight: 80.332 kg (177 lb 1.6 oz) 81.103 kg (178 lb 12.8 oz) 81.149 kg (178 lb 14.4 oz)    History of present illness:  Patient is a 58 year old male with no medical history, has not seen any any physicians in the past for regular medical care presented to ER with chief complaint of intermittent chest pain with shortness of breath for last 2-3 weeks. History was obtained from the patient who reported that he has noticed intermittent left-sided chest pain with associated shortness of breath, episodic, worse with exertion, lasting about 15-20 minutes and improved some rest. He also noticed shortness of breath worse with exertion and at night on laying flat. He denies any prior cardiac workup. He's not on any medications. At the time of arrival to the ER, patient's BP was 156/115. Patient also reports intermittent headaches and blurry vision, with chest pains. He denies any pedal edema. Patient also reports off and on "heartburn" .   Hospital Course:  1-Chest Pain -Patient with no reported past medical history presenting with CP having typical features  -Presented  hypertensive with blood pressure of 162/115  -Troponins cycled over night remained negative  -Cardiac catheterization showing nonobstructive coronary artery disease, chest pain likely secondary to acute decompensated congestive heart failure  -chest pain resolved.   2. Acute systolic congestive heart failure, dilated cardiomyopathy  -Patient having a left ventricular ejection fraction of 25% with severe left ventricular enlargement  -Continue Coreg, lisinopril, spironolactone  -Unclear etiology, possibilities include hypertension, history of alcohol abuse, remote history of cocaine abuse. HIV screen negative.  -Patient at increased risk for developing life threatening arrhythmias, cardiology recommending life vest prior to discharge. Discharge home later today if he get life vest.   3. Accelerated Hypertension  -He presented with BP of 162/115, complaining of chest pain  -Unaware of diagnosis of HTN, EKG showed LVH  -Blood pressures improved with the administration of Imdur, Coreg and lisinopril   4. GERD  -On pepcid.    Procedures:  Cath:   Consultations:  Cardiology  Discharge Exam: Filed Vitals:   07/07/14 1614  BP: 127/95  Pulse: 72  Temp:   Resp:     General: Alert in no distress.  Cardiovascular: S 1, S 2 RRR Respiratory: CTA  Discharge Instructions You were cared for by a hospitalist during your hospital stay. If you have any questions about your discharge medications or the care you received while you were in the hospital after you are discharged, you can call the unit and asked to speak with the hospitalist on call if the hospitalist that took care of you is not available. Once you are discharged, your primary care physician will handle  any further medical issues. Please note that NO REFILLS for any discharge medications will be authorized once you are discharged, as it is imperative that you return to your primary care physician (or establish a relationship with a  primary care physician if you do not have one) for your aftercare needs so that they can reassess your need for medications and monitor your lab values.  Discharge Instructions   Diet - low sodium heart healthy    Complete by:  As directed      Increase activity slowly    Complete by:  As directed             Medication List    STOP taking these medications       naproxen sodium 220 MG tablet  Commonly known as:  ANAPROX      TAKE these medications       carvedilol 3.125 MG tablet  Commonly known as:  COREG  Take 1 tablet (3.125 mg total) by mouth 2 (two) times daily with a meal.     famotidine 20 MG tablet  Commonly known as:  PEPCID  Take 1 tablet (20 mg total) by mouth daily.  Start taking on:  07/08/2014     isosorbide mononitrate 30 MG 24 hr tablet  Commonly known as:  IMDUR  Take 1 tablet (30 mg total) by mouth daily.     lisinopril 5 MG tablet  Commonly known as:  PRINIVIL,ZESTRIL  Take 1 tablet (5 mg total) by mouth daily.     spironolactone 12.5 mg Tabs tablet  Commonly known as:  ALDACTONE  Take 0.5 tablets (12.5 mg total) by mouth 2 (two) times daily.       No Known Allergies     Follow-up Information   Follow up with Jenell Milliner, MD On 07/21/2014. (Appointment  with [facility]  for PCP and Cardiology follow up 07/21/14 at 10:30)    Specialty:  Cardiology   Contact information:   [address] [place] [address] [phone]        The results of significant diagnostics from this hospitalization (including imaging, microbiology, ancillary and laboratory) are listed below for reference.    Significant Diagnostic Studies: Dg Chest 2 View  07/04/2014   CLINICAL DATA:  Short of breath.  Chest pain.  EXAM: CHEST  2 VIEW  COMPARISON:  None.  FINDINGS: Moderate CHF is present with asymmetric pulmonary edema. There is diffuse interstitial edema with basilar predominant alveolar edema. Cardiopericardial silhouette is enlarged. Aortic arch atherosclerosis.  No focal consolidation to suggest pneumonia.  IMPRESSION: Moderate CHF.   Electronically Signed   By: Dereck Ligas M.D.   On: 07/04/2014 14:47   Ct Angio Chest Pe W/cm &/or Wo Cm  07/04/2014   CLINICAL DATA:  Left chest pain and some shortness of breath for the past 3 weeks.  EXAM: CT ANGIOGRAPHY CHEST WITH CONTRAST  TECHNIQUE: Multidetector CT imaging of the chest was performed using the standard protocol during bolus administration of intravenous contrast. Multiplanar CT image reconstructions and MIPs were obtained to evaluate the vascular anatomy.  CONTRAST:  52m OMNIPAQUE IOHEXOL 350 MG/ML SOLN  COMPARISON:  Chest radiographs obtained earlier today.  FINDINGS: There is reflux of contrast into the inferior vena cava and hepatic veins. The heart is mildly enlarged with normal sized right atrium and ventricle and moderately dilated left ventricle and left atrium. The pulmonary arteries are normally opacified with no pulmonary arterial filling defects seen. The pulmonary vasculature remains prominent.  Mild interstitial pulmonary edema is again demonstrated with peripheral Kerley lines. No lung nodules or enlarged lymph nodes. Mild thoracic and upper lumbar spine degenerative changes. Small right lobe liver cyst.  Review of the MIP images confirms the above findings.  IMPRESSION: 1. Cardiomegaly and mild acute congestive heart failure. 2. Reflux contrast into the inferior vena cava and hepatic veins, suggesting an element of right heart failure. 3. No pulmonary emboli.   Electronically Signed   By: Enrique Sack M.D.   On: 07/04/2014 15:23    Microbiology: No results found for this or any previous visit (from the past 240 hour(s)).   Labs: Basic Metabolic Panel:  Recent Labs Lab 07/04/14 1156 07/05/14 0015 07/05/14 1845 07/06/14 0420 07/07/14 0341  NA 142 140  --  141 145  K 4.2 3.9  --  3.8 4.0  CL 107 105  --  107 108  CO2 24 23  --  23 23  GLUCOSE 89 91  --  90 90  BUN 17 23  --  25* 17   CREATININE 1.29 1.36* 1.35 1.27 1.28  CALCIUM 8.9 8.5  --  8.5 8.9   Liver Function Tests: No results found for this basename: AST, ALT, ALKPHOS, BILITOT, PROT, ALBUMIN,  in the last 168 hours No results found for this basename: LIPASE, AMYLASE,  in the last 168 hours No results found for this basename: AMMONIA,  in the last 168 hours CBC:  Recent Labs Lab 07/04/14 1156 07/05/14 1845  WBC 6.7 7.0  HGB 14.4 13.1  HCT 41.5 37.9*  MCV 89.8 90.5  PLT 182 182   Cardiac Enzymes:  Recent Labs Lab 07/04/14 1839 07/04/14 2136 07/05/14 0015  TROPONINI <0.30 <0.30 <0.30   BNP: BNP (last 3 results)  Recent Labs  07/04/14 1156 07/07/14 0341  PROBNP 1550.0* 1397.0*   CBG: No results found for this basename: GLUCAP,  in the last 168 hours     Signed:  Niel Hummer A  [facility] 07/07/2014, 5:41 PM

## 2014-07-08 NOTE — ED Provider Notes (Signed)
Medical screening examination/treatment/procedure(s) were conducted as a shared visit with non-physician practitioner(s) and myself.  I personally evaluated the patient during the encounter.   EKG Interpretation   Date/Time:  Sunday July 04 2014 11:21:40 EDT Ventricular Rate:  108 PR Interval:  154 QRS Duration: 102 QT Interval:  360 QTC Calculation: 482 R Axis:   72 Text Interpretation:  Sinus tachycardia Biatrial enlargement Left  ventricular hypertrophy c increased voltage and Lateral T inversions  Abnormal ECG Confirmed by Jeneen Rinks  MD, Luther ([id number]) on 07/04/2014 11:33:57 AM      Patient seen and evaluated. He complains of dyspnea over the last several days. On exam he is tachypneic. Not hypoxemic. D-dimer elevated at CT angiogram negative. Does have effusions and signs of CHF with elevated BNP. Resting sinus tachycardia. Plan will be blood pressure control diuresis and admission for further evaluation regarding new-onset congestive heart failure.  Tanna Furry, MD 07/08/14 [phone]

## 2014-07-21 ENCOUNTER — Encounter: Payer: Self-pay | Admitting: Cardiology

## 2014-07-21 ENCOUNTER — Ambulatory Visit: Payer: Self-pay | Attending: Cardiology | Admitting: Cardiology

## 2014-07-21 VITALS — BP 95/62 | HR 77 | Temp 99.4°F | Resp 16 | Ht 70.0 in | Wt 182.0 lb

## 2014-07-21 DIAGNOSIS — I5022 Chronic systolic (congestive) heart failure: Secondary | ICD-10-CM

## 2014-07-21 DIAGNOSIS — I509 Heart failure, unspecified: Secondary | ICD-10-CM

## 2014-07-21 LAB — BASIC METABOLIC PANEL
BUN: 22 mg/dL (ref 6–23)
CO2: 29 mEq/L (ref 19–32)
Calcium: 8.8 mg/dL (ref 8.4–10.5)
Chloride: 104 mEq/L (ref 96–112)
Creat: 1.3 mg/dL (ref 0.50–1.35)
GLUCOSE: 78 mg/dL (ref 70–99)
POTASSIUM: 5.1 meq/L (ref 3.5–5.3)
Sodium: 138 mEq/L (ref 135–145)

## 2014-07-21 MED ORDER — MULTIVITAMINS PO CAPS: 1.0000 | ORAL_CAPSULE | Freq: Every day | ORAL | Status: DC

## 2014-07-21 NOTE — Progress Notes (Signed)
Pt comes in to see Dr. Verl Blalock s/p Acute heart failure,accelerated HTn with medical management Pt s/p Left heart cardiac cath with LV dysfunction  C/o left chest wall pain/sob and seeing black spots with ambulation post d/c Pt is taking medications regularly Orthostats negative

## 2014-07-21 NOTE — Progress Notes (Signed)
HPI Joel Beck comes today post hospitalization for acute systolic CHF in the setting of uncontrolled BP and no medical care. Cardiac cath showed nonobstructive CAD and Echo showed severe LV dilatation and Ef of 25%, mild Joel and moderate LAE. He has been compliant with his meds and diet. He complains of postural light-headedness. He has DOE but no orthopnea, PND, CP or edema. He is wearing a life vest but denies any palpitations.  History reviewed. No pertinent past medical history.  Current Outpatient Prescriptions  Medication Sig Dispense Refill  . aspirin EC 81 MG tablet Take 1 tablet (81 mg total) by mouth daily.  30 tablet  0  . carvedilol (COREG) 3.125 MG tablet Take 1 tablet (3.125 mg total) by mouth 2 (two) times daily with a meal.  60 tablet  0  . famotidine (PEPCID) 20 MG tablet Take 1 tablet (20 mg total) by mouth daily.  30 tablet  0  . isosorbide mononitrate (IMDUR) 30 MG 24 hr tablet Take 1 tablet (30 mg total) by mouth daily.  30 tablet  0  . lisinopril (PRINIVIL,ZESTRIL) 5 MG tablet Take 1 tablet (5 mg total) by mouth daily.  30 tablet  0  . spironolactone (ALDACTONE) 12.5 mg TABS tablet Take 0.5 tablets (12.5 mg total) by mouth 2 (two) times daily.  60 tablet  0  . Multiple Vitamin (MULTIVITAMIN) capsule Take 1 capsule by mouth daily.  30 capsule  2   No current facility-administered medications for this visit.    No Known Allergies  History reviewed. No pertinent family history.  History   Social History  . Marital Status: Married    Spouse Name: N/A    Number of Children: N/A  . Years of Education: N/A   Occupational History  . Not on file.   Social History Main Topics  . Smoking status: Never Smoker   . Smokeless tobacco: Not on file  . Alcohol Use: Yes     Comment: occ  . Drug Use: No  . Sexual Activity: Not on file   Other Topics Concern  . Not on file   Social History Narrative  . No narrative on file    ROS ALL NEGATIVE EXCEPT THOSE NOTED IN  HPI  PE  General Appearance: well developed, well nourished in no acute distress HEENT: symmetrical face, PERRLA, good dentition  Neck: no JVD, thyromegaly, or adenopathy, trachea midline Chest: symmetric without deformity, life vest in place Cardiac: PMI displaced, RRR, normal S1, S2, no gallop or murmur Lung: clear to ausculation and percussion Vascular: all pulses full without bruits  Abdominal: nondistended, nontender, good bowel sounds, no HSM, no bruits Extremities: no cyanosis, clubbing or edema, no sign of DVT, no varicosities  Skin: normal color, no rashes Neuro: alert and oriented x 3, non-focal Pysch: normal affect  EKG Not repeated BMET    Component Value Date/Time   NA 145 07/07/2014 0341   K 4.0 07/07/2014 0341   CL 108 07/07/2014 0341   CO2 23 07/07/2014 0341   GLUCOSE 90 07/07/2014 0341   BUN 17 07/07/2014 0341   CREATININE 1.28 07/07/2014 0341   CALCIUM 8.9 07/07/2014 0341   GFRNONAA 61* 07/07/2014 0341   GFRAA 71* 07/07/2014 0341    Lipid Panel     Component Value Date/Time   CHOL 171 07/04/2014 1839   TRIG 107 07/04/2014 1839   HDL 45 07/04/2014 1839   CHOLHDL 3.8 07/04/2014 1839   VLDL 21 07/04/2014 1839   LDLCALC 105*  07/04/2014 1839    CBC    Component Value Date/Time   WBC 7.0 07/05/2014 1845   RBC 4.19* 07/05/2014 1845   HGB 13.1 07/05/2014 1845   HCT 37.9* 07/05/2014 1845   PLT 182 07/05/2014 1845   MCV 90.5 07/05/2014 1845   MCH 31.3 07/05/2014 1845   MCHC 34.6 07/05/2014 1845   RDW 12.2 07/05/2014 1845

## 2014-07-21 NOTE — Assessment & Plan Note (Signed)
Doing well and compliant. Mildly orthostatic so will discontinue IMDUR. Check BMET and see me in 4 weeks. Call made to [facility] EP for followup with Life Vest.

## 2014-07-21 NOTE — Addendum Note (Signed)
Addended by: Candie Chroman D on: 07/21/2014 11:53 AM   Modules accepted: Orders, Medications

## 2014-07-23 ENCOUNTER — Other Ambulatory Visit: Payer: Self-pay | Admitting: Emergency Medicine

## 2014-07-23 ENCOUNTER — Telehealth: Payer: Self-pay | Admitting: Cardiology

## 2014-07-23 MED ORDER — SPIRONOLACTONE 12.5 MG HALF TABLET
12.5000 mg | ORAL_TABLET | Freq: Two times a day (BID) | ORAL | Status: DC
Start: 2014-07-23 — End: 2014-07-28

## 2014-07-23 NOTE — Telephone Encounter (Signed)
Patient needs medication refill for Spironolactone 25 mg Please f/u with Patient ASAP

## 2014-07-26 ENCOUNTER — Telehealth: Payer: Self-pay | Admitting: Emergency Medicine

## 2014-07-26 NOTE — Telephone Encounter (Signed)
Message copied by Ricci Barker on Mon Jul 26, 2014  4:10 PM ------      Message from: Jenell Milliner C      Created: Fri Jul 23, 2014 11:01 AM       Stable. No change in meds. ------

## 2014-07-26 NOTE — Telephone Encounter (Signed)
Number listed invalid. If pt calls please transfer call to nurse Sharee Pimple

## 2014-07-28 ENCOUNTER — Other Ambulatory Visit: Payer: Self-pay | Admitting: Emergency Medicine

## 2014-07-28 MED ORDER — FAMOTIDINE 20 MG PO TABS
20.0000 mg | ORAL_TABLET | Freq: Every day | ORAL | Status: DC
Start: 2014-07-28 — End: 2014-08-11

## 2014-07-28 MED ORDER — SPIRONOLACTONE 12.5 MG HALF TABLET: 12.5000 mg | ORAL_TABLET | Freq: Two times a day (BID) | ORAL | Status: DC

## 2014-07-28 MED ORDER — LISINOPRIL 5 MG PO TABS
5.0000 mg | ORAL_TABLET | Freq: Every day | ORAL | Status: DC
Start: 2014-07-28 — End: 2014-08-11

## 2014-07-28 MED ORDER — CARVEDILOL 3.125 MG PO TABS: 3.1250 mg | ORAL_TABLET | Freq: Two times a day (BID) | ORAL | Status: DC

## 2014-07-29 ENCOUNTER — Other Ambulatory Visit: Payer: Self-pay | Admitting: *Deleted

## 2014-08-02 ENCOUNTER — Other Ambulatory Visit: Payer: Self-pay | Admitting: Emergency Medicine

## 2014-08-02 MED ORDER — SPIRONOLACTONE 12.5 MG HALF TABLET
12.5000 mg | ORAL_TABLET | Freq: Two times a day (BID) | ORAL | Status: DC
Start: 2014-08-02 — End: 2014-09-02

## 2014-08-11 ENCOUNTER — Ambulatory Visit: Payer: Medicaid Other | Attending: Cardiology | Admitting: Cardiology

## 2014-08-11 ENCOUNTER — Encounter: Payer: Self-pay | Admitting: Cardiology

## 2014-08-11 VITALS — BP 152/109 | HR 80 | Temp 98.8°F | Resp 16 | Ht 70.0 in | Wt 181.0 lb

## 2014-08-11 DIAGNOSIS — I110 Hypertensive heart disease with heart failure: Secondary | ICD-10-CM | POA: Diagnosis not present

## 2014-08-11 DIAGNOSIS — Z7982 Long term (current) use of aspirin: Secondary | ICD-10-CM | POA: Diagnosis not present

## 2014-08-11 DIAGNOSIS — K2100 Gastro-esophageal reflux disease with esophagitis, without bleeding: Secondary | ICD-10-CM

## 2014-08-11 DIAGNOSIS — R7989 Other specified abnormal findings of blood chemistry: Secondary | ICD-10-CM

## 2014-08-11 DIAGNOSIS — I2510 Atherosclerotic heart disease of native coronary artery without angina pectoris: Secondary | ICD-10-CM | POA: Diagnosis not present

## 2014-08-11 DIAGNOSIS — I5022 Chronic systolic (congestive) heart failure: Secondary | ICD-10-CM | POA: Insufficient documentation

## 2014-08-11 DIAGNOSIS — I119 Hypertensive heart disease without heart failure: Secondary | ICD-10-CM | POA: Insufficient documentation

## 2014-08-11 DIAGNOSIS — Z79899 Other long term (current) drug therapy: Secondary | ICD-10-CM | POA: Diagnosis not present

## 2014-08-11 DIAGNOSIS — K219 Gastro-esophageal reflux disease without esophagitis: Secondary | ICD-10-CM | POA: Diagnosis not present

## 2014-08-11 DIAGNOSIS — I509 Heart failure, unspecified: Secondary | ICD-10-CM | POA: Insufficient documentation

## 2014-08-11 DIAGNOSIS — Z23 Encounter for immunization: Secondary | ICD-10-CM

## 2014-08-11 DIAGNOSIS — R064 Hyperventilation: Secondary | ICD-10-CM | POA: Diagnosis not present

## 2014-08-11 MED ORDER — FAMOTIDINE 20 MG PO TABS: 20.0000 mg | ORAL_TABLET | Freq: Two times a day (BID) | ORAL | Status: DC

## 2014-08-11 MED ORDER — PANTOPRAZOLE SODIUM 40 MG PO TBEC: 40.0000 mg | DELAYED_RELEASE_TABLET | Freq: Every day | ORAL | Status: DC

## 2014-08-11 MED ORDER — LISINOPRIL 10 MG PO TABS: 10.0000 mg | ORAL_TABLET | Freq: Every day | ORAL | Status: DC

## 2014-08-11 MED ORDER — CARVEDILOL 6.25 MG PO TABS
6.2500 mg | ORAL_TABLET | Freq: Two times a day (BID) | ORAL | Status: DC
Start: 2014-08-11 — End: 2014-09-02

## 2014-08-11 NOTE — Assessment & Plan Note (Signed)
He is on famotidine. Change to twice a day.

## 2014-08-11 NOTE — Assessment & Plan Note (Signed)
His chronic systolic heart failure is felt to be secondary to hypertensive heart disease. He originally presented with malignant hypertension. He has nonobstructive coronary disease. Blood pressure still suboptimally controlled so we'll increase his carvedilol to 6.25 mg by mouth twice a day and his lisinopril to 10 mg per day. Followup BMET next week watching for hyperkalemia. We'll check blood pressure at that time. Follow with me in 3 months.

## 2014-08-11 NOTE — Progress Notes (Signed)
LCSW met with patient with the accompaniment of the translator.  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 LCSW encouraged patient to contact [employer] for clarity.  Patient was not clear that he would get the support tht he needs, so LCSW offered that patient can present with disability application so that LCSW can provide direction for completion.  Christene Lye MSW, LCSW

## 2014-08-11 NOTE — Progress Notes (Signed)
Pt here to f/u with Dr. Verl Blalock for [facility] with cardiac management Pt is compliant with taking medications daily Pt has life vest on. C/o slight left chest pain Pt is very upset today after an argument with wife and son. 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 BP- 152/109 80

## 2014-08-11 NOTE — Addendum Note (Signed)
Addended by: Candie Chroman D on: 08/11/2014 12:15 PM   Modules accepted: Orders, Medications

## 2014-08-11 NOTE — Progress Notes (Signed)
HPI Joel Beck returns today for close followup of his chronic systolic heart failure secondary to hypertensive heart disease.  He's been feeling well. He has some dyspnea on exertion and also in the heat he gets a little short of breath. He denies true orthopnea or PND. He does develop a cough with some reflux lying down at night.  His weight is stable. He is very compliant with his medications. He is upset this morning from a domestic argument and when he arrived his blood pressure was high. After calming him down he is 140/92. Heart rate is 80.  History reviewed. No pertinent past medical history.  Current Outpatient Prescriptions  Medication Sig Dispense Refill  . carvedilol (COREG) 6.25 MG tablet Take 1 tablet (6.25 mg total) by mouth 2 (two) times daily with a meal.  60 tablet  2  . famotidine (PEPCID) 20 MG tablet Take 1 tablet (20 mg total) by mouth daily.  30 tablet  2  . lisinopril (PRINIVIL,ZESTRIL) 10 MG tablet Take 1 tablet (10 mg total) by mouth daily.  30 tablet  2  . spironolactone (ALDACTONE) 12.5 mg TABS tablet Take 0.5 tablets (12.5 mg total) by mouth 2 (two) times daily.  60 tablet  0  . aspirin EC 81 MG tablet Take 1 tablet (81 mg total) by mouth daily.  30 tablet  0  . Multiple Vitamin (MULTIVITAMIN) capsule Take 1 capsule by mouth daily.  30 capsule  2  . pantoprazole (PROTONIX) 40 MG tablet Take 1 tablet (40 mg total) by mouth daily.  30 tablet  3   No current facility-administered medications for this visit.    No Known Allergies  History reviewed. No pertinent family history.  History   Social History  . Marital Status: Married    Spouse Name: N/A    Number of Children: N/A  . Years of Education: N/A   Occupational History  . Not on file.   Social History Main Topics  . Smoking status: Never Smoker   . Smokeless tobacco: Not on file  . Alcohol Use: Yes     Comment: occ  . Drug Use: No  . Sexual Activity: Not on file   Other Topics Concern  . Not  on file   Social History Narrative  . No narrative on file    ROS ALL NEGATIVE EXCEPT THOSE NOTED IN HPI  PE  General Appearance: well developed, well nourished in no acute distress HEENT: symmetrical face, PERRLA, good dentition  Neck: no JVD, thyromegaly, or adenopathy, trachea midline Chest: symmetric without deformity Cardiac: PMI non-displaced, RRR, normal S1, S2, no gallop or murmur Lung: clear to ausculation and percussion Vascular: all pulses full without bruits  Abdominal: nondistended, nontender, good bowel sounds, no HSM, no bruits Extremities: no cyanosis, clubbing or edema, no sign of DVT, no varicosities  Skin: normal color, no rashes Neuro: alert and oriented x 3, non-focal Pysch: normal affect  EKG  BMET    Component Value Date/Time   NA 138 07/21/2014 1108   K 5.1 07/21/2014 1108   CL 104 07/21/2014 1108   CO2 29 07/21/2014 1108   GLUCOSE 78 07/21/2014 1108   BUN 22 07/21/2014 1108   CREATININE 1.30 07/21/2014 1108   CREATININE 1.28 07/07/2014 0341   CALCIUM 8.8 07/21/2014 1108   GFRNONAA 61* 07/07/2014 0341   GFRAA 71* 07/07/2014 0341    Lipid Panel     Component Value Date/Time   CHOL 171 07/04/2014 1839   TRIG 107  07/04/2014 1839   HDL 45 07/04/2014 1839   CHOLHDL 3.8 07/04/2014 1839   VLDL 21 07/04/2014 1839   LDLCALC 105* 07/04/2014 1839    CBC    Component Value Date/Time   WBC 7.0 07/05/2014 1845   RBC 4.19* 07/05/2014 1845   HGB 13.1 07/05/2014 1845   HCT 37.9* 07/05/2014 1845   PLT 182 07/05/2014 1845   MCV 90.5 07/05/2014 1845   MCH 31.3 07/05/2014 1845   MCHC 34.6 07/05/2014 1845   RDW 12.2 07/05/2014 1845

## 2014-08-11 NOTE — Patient Instructions (Signed)
Take prescribed medication as ordered Return next week for blood pressure recheck Medication Coreg increased to 6.25 mg twice a day and Lisinopril 10 mg tablet Start taking Protonix 40 mg tablet for heart burn.

## 2014-08-18 ENCOUNTER — Ambulatory Visit: Payer: Medicaid Other | Attending: Cardiology

## 2014-08-18 VITALS — BP 136/91 | HR 73 | Resp 16

## 2014-08-18 DIAGNOSIS — R7989 Other specified abnormal findings of blood chemistry: Secondary | ICD-10-CM

## 2014-08-18 DIAGNOSIS — Z0000 Encounter for general adult medical examination without abnormal findings: Secondary | ICD-10-CM

## 2014-08-18 NOTE — Progress Notes (Unsigned)
Blood pressure still suboptimally controlled so we'll increase his carvedilol to 6.25 mg by mouth twice a day and his lisinopril to 10 mg per day. Because of these changes pt is here to check his BP and have a BMET test.

## 2014-08-18 NOTE — Patient Instructions (Signed)
BP is good today continue taking medications as prescribed. We will wait and see the results of your lab work to make further assessments. Return in 2 weeks for BP check.

## 2014-08-19 LAB — BASIC METABOLIC PANEL
BUN: 18 mg/dL (ref 6–23)
CO2: 28 meq/L (ref 19–32)
Calcium: 9.5 mg/dL (ref 8.4–10.5)
Chloride: 107 mEq/L (ref 96–112)
Creat: 1.19 mg/dL (ref 0.50–1.35)
GLUCOSE: 89 mg/dL (ref 70–99)
Potassium: 4.3 mEq/L (ref 3.5–5.3)
Sodium: 142 mEq/L (ref 135–145)

## 2014-08-20 ENCOUNTER — Other Ambulatory Visit: Payer: Self-pay

## 2014-08-23 ENCOUNTER — Encounter: Payer: Self-pay | Admitting: *Deleted

## 2014-08-23 DIAGNOSIS — Z599 Problem related to housing and economic circumstances, unspecified: Secondary | ICD-10-CM

## 2014-08-23 NOTE — Progress Notes (Signed)
LCSW met with patient in order to provide support with completing his medicaid application.  LCSW also provided information for [employer] and Housing authority for additional support. LCSW attempted to support patient with establishing [facility] as his primary care practice, but patient will call to make an appointment.   Christene Lye MSW, LCSW

## 2014-09-02 ENCOUNTER — Encounter: Payer: Self-pay | Admitting: Family Medicine

## 2014-09-02 ENCOUNTER — Ambulatory Visit: Payer: Medicaid Other | Attending: Family Medicine | Admitting: Family Medicine

## 2014-09-02 ENCOUNTER — Other Ambulatory Visit: Payer: Self-pay

## 2014-09-02 VITALS — BP 147/89 | HR 67 | Temp 98.2°F | Resp 18 | Ht 71.0 in | Wt 187.0 lb

## 2014-09-02 DIAGNOSIS — I10 Essential (primary) hypertension: Secondary | ICD-10-CM | POA: Insufficient documentation

## 2014-09-02 DIAGNOSIS — Z1211 Encounter for screening for malignant neoplasm of colon: Secondary | ICD-10-CM

## 2014-09-02 DIAGNOSIS — Z418 Encounter for other procedures for purposes other than remedying health state: Secondary | ICD-10-CM

## 2014-09-02 DIAGNOSIS — Z87891 Personal history of nicotine dependence: Secondary | ICD-10-CM | POA: Insufficient documentation

## 2014-09-02 DIAGNOSIS — B351 Tinea unguium: Secondary | ICD-10-CM | POA: Diagnosis not present

## 2014-09-02 DIAGNOSIS — Z299 Encounter for prophylactic measures, unspecified: Secondary | ICD-10-CM

## 2014-09-02 DIAGNOSIS — I5022 Chronic systolic (congestive) heart failure: Secondary | ICD-10-CM | POA: Diagnosis present

## 2014-09-02 DIAGNOSIS — Z7982 Long term (current) use of aspirin: Secondary | ICD-10-CM | POA: Insufficient documentation

## 2014-09-02 DIAGNOSIS — Z23 Encounter for immunization: Secondary | ICD-10-CM

## 2014-09-02 LAB — COMPLETE METABOLIC PANEL WITH GFR
ALK PHOS: 69 U/L (ref 39–117)
ALT: 25 U/L (ref 0–53)
AST: 18 U/L (ref 0–37)
Albumin: 4.6 g/dL (ref 3.5–5.2)
BILIRUBIN TOTAL: 0.5 mg/dL (ref 0.2–1.2)
BUN: 17 mg/dL (ref 6–23)
CO2: 26 mEq/L (ref 19–32)
Calcium: 9.8 mg/dL (ref 8.4–10.5)
Chloride: 102 mEq/L (ref 96–112)
Creat: 1.22 mg/dL (ref 0.50–1.35)
GFR, Est African American: 76 mL/min
GFR, Est Non African American: 66 mL/min
Glucose, Bld: 65 mg/dL — ABNORMAL LOW (ref 70–99)
Potassium: 4.4 mEq/L (ref 3.5–5.3)
SODIUM: 138 meq/L (ref 135–145)
TOTAL PROTEIN: 7.2 g/dL (ref 6.0–8.3)

## 2014-09-02 MED ORDER — SPIRONOLACTONE 25 MG PO TABS: 12.5000 mg | ORAL_TABLET | Freq: Two times a day (BID) | ORAL | Status: DC

## 2014-09-02 MED ORDER — LOSARTAN POTASSIUM 50 MG PO TABS: 50.0000 mg | ORAL_TABLET | Freq: Every day | ORAL | Status: DC

## 2014-09-02 MED ORDER — CARVEDILOL 6.25 MG PO TABS: 6.2500 mg | ORAL_TABLET | Freq: Two times a day (BID) | ORAL | Status: DC

## 2014-09-02 NOTE — Assessment & Plan Note (Addendum)
Check LFTs, treat with lamisil x 12 week course if normal   CMP normal, lamisil sent to pharmacy

## 2014-09-02 NOTE — Progress Notes (Signed)
   Subjective:    Patient ID: Joel Beck, male    DOB: 1956/10/29, 58 y.o.   MRN: [id number] CC: establish care, physical  HPI 58 year old male with rEFCHF and HTN presents to for a physical.   Complaints/concerns:  #1 cough: dry cough since starting lisinopril. Worse when lying flat. No CP, SOB or LE edema.   Soc Hx: former smoker  Review of Systems General:  Negative for nexplained weight loss, fever Skin: Negative for new or changing mole, sore that won't heal HEENT: Negative for trouble hearing, trouble seeing, ringing in ears, mouth sores, hoarseness, change in voice, dysphagia. CV:  Negative for chest pain, dyspnea, edema, palpitations Resp: Negative for dyspnea, hemoptysis GI: Negative for nausea, vomiting, diarrhea, constipation, abdominal pain, melena, hematochezia. GU: Negative for dysuria, incontinence, urinary hesitance, hematuria, vaginal or penile discharge, polyuria, sexual difficulty, lumps in testicle or breasts MSK: Negative for muscle cramps or aches, joint pain or swelling Neuro: Negative for headaches, weakness, numbness, dizziness, passing out/fainting Psych: Negative for depression, anxiety, memory problems     Objective:   Physical Exam BP 147/89  Pulse 67  Temp(Src) 98.2 F (36.8 C)  Resp 18  Ht 5\' 11"  (1.803 m)  Wt 187 lb (84.823 kg)  BMI 26.09 kg/m2  SpO2 98% BP Readings from Last 3 Encounters:  09/02/14 147/89  08/18/14 136/91  08/11/14 152/109     General Appearance:    Alert, cooperative, no distress, appears stated age  Head:    Normocephalic, without obvious abnormality, atraumatic  Eyes:    PERRL, conjunctiva/corneas clear, EOM's intact, fundi    benign, both eyes       Ears:    Normal TM's and external ear canals, both ears  Nose:   Nares normal, septum midline, mucosa normal, no drainage   or sinus tenderness  Throat:   Lips, mucosa, and tongue normal; teeth and gums normal  Neck:   Supple, symmetrical, trachea midline, no  adenopathy;       thyroid:  No enlargement/tenderness/nodules; no carotid   bruit or JVD  Back:     Symmetric, no curvature, ROM normal, no CVA tenderness  Lungs:     Clear to auscultation bilaterally, respirations unlabored  Chest wall:    No tenderness or deformity, wearing Zoll life vest   Heart:    Regular rate and rhythm, S1 and S2 normal, no murmur, rub   or gallop  Abdomen:     Soft, non-tender, bowel sounds active all four quadrants,    no masses, no organomegaly  Genitalia:    Normal male without lesion, discharge or tenderness  Rectal:    Normal tone, normal prostate, no masses or tenderness;   guaiac negative stool  Extremities:   Extremities normal, atraumatic, no cyanosis or edema, thickened, yellow, chipped toenails   Pulses:   2+ and symmetric all extremities  Skin:   Skin color, texture, turgor normal, no rashes or lesions  Lymph nodes:   Cervical, supraclavicular, and axillary nodes normal  Neurologic:   CNII-XII intact. Normal strength, sensation  throughout         Assessment & Plan:

## 2014-09-02 NOTE — Assessment & Plan Note (Signed)
A: compensated Meds: complaint P: Change for lisinopril to losartan due to cough Continue aldactone and coreg  Check CMP today

## 2014-09-02 NOTE — Progress Notes (Signed)
Establish Care Annual physical Complaining of productive cough

## 2014-09-02 NOTE — Patient Instructions (Signed)
Rial,  Thank you for coming in today. It was a pleasure meeting you. I look forward to being your primary doctor.   1. For cough: we will stop lisinopril.  Take losartan 25 mg once daily.  Continue spironolactone 25 mg  Tablets, 1/2 tab twice daily Continue coreg 6.25 mg daily.   2. Health maintenance: Referral to GI for screening colonoscopy, we will work on this and call you with appt details. Tdap done today.   3. For toenail fungus, plan to take lamisil once daily if liver function test are normal.   You will be called with lab results.   Return in 3 months

## 2014-09-02 NOTE — Assessment & Plan Note (Signed)
A: slightly above goal today  Meds: complaint P: Change from lisinopril to losartan due to cough Continue aldactone and coreg  Check CMP today

## 2014-09-02 NOTE — Assessment & Plan Note (Signed)
Gi referral 

## 2014-09-03 MED ORDER — TERBINAFINE HCL 250 MG PO TABS: 250.0000 mg | ORAL_TABLET | Freq: Every day | ORAL | Status: DC

## 2014-09-03 NOTE — Addendum Note (Signed)
Addended by: [name] on: 09/03/2014 09:29 AM   Modules accepted: Orders

## 2014-09-08 ENCOUNTER — Telehealth: Payer: Self-pay | Admitting: *Deleted

## 2014-09-08 NOTE — Telephone Encounter (Signed)
Message copied by Betti Cruz on Wed Sep 08, 2014 11:07 AM ------      Message from: [name]      Created: Fri Sep 03, 2014  9:27 AM       Normal CMP, lamisil ordered ------

## 2014-09-08 NOTE — Telephone Encounter (Signed)
Pt aware of lab results, pick up Rx already

## 2014-11-11 ENCOUNTER — Encounter (HOSPITAL_COMMUNITY): Payer: Self-pay | Admitting: Cardiology

## 2014-12-13 ENCOUNTER — Ambulatory Visit: Payer: Medicaid Other | Admitting: Family Medicine

## 2014-12-27 ENCOUNTER — Ambulatory Visit: Payer: Medicaid Other | Admitting: Family Medicine

## 2015-01-03 ENCOUNTER — Ambulatory Visit: Payer: Medicaid Other | Attending: Family Medicine | Admitting: Family Medicine

## 2015-01-03 ENCOUNTER — Encounter: Payer: Self-pay | Admitting: Family Medicine

## 2015-01-03 VITALS — BP 137/94 | HR 78 | Temp 98.3°F | Resp 16 | Ht 71.0 in | Wt 187.0 lb

## 2015-01-03 DIAGNOSIS — K219 Gastro-esophageal reflux disease without esophagitis: Secondary | ICD-10-CM | POA: Diagnosis not present

## 2015-01-03 DIAGNOSIS — Z87891 Personal history of nicotine dependence: Secondary | ICD-10-CM | POA: Diagnosis not present

## 2015-01-03 DIAGNOSIS — I10 Essential (primary) hypertension: Secondary | ICD-10-CM | POA: Insufficient documentation

## 2015-01-03 MED ORDER — CARVEDILOL 6.25 MG PO TABS: 6.2500 mg | ORAL_TABLET | Freq: Two times a day (BID) | ORAL | Status: DC

## 2015-01-03 MED ORDER — ASPIRIN EC 81 MG PO TBEC: 81.0000 mg | DELAYED_RELEASE_TABLET | Freq: Every day | ORAL | Status: DC

## 2015-01-03 MED ORDER — FAMOTIDINE 20 MG PO TABS: 20.0000 mg | ORAL_TABLET | Freq: Two times a day (BID) | ORAL | Status: DC

## 2015-01-03 MED ORDER — LOSARTAN POTASSIUM 50 MG PO TABS: 50.0000 mg | ORAL_TABLET | Freq: Every day | ORAL | Status: DC

## 2015-01-03 MED ORDER — FUROSEMIDE 40 MG PO TABS: 40.0000 mg | ORAL_TABLET | Freq: Two times a day (BID) | ORAL | Status: DC

## 2015-01-03 MED ORDER — POTASSIUM CHLORIDE CRYS ER 20 MEQ PO TBCR: 20.0000 meq | EXTENDED_RELEASE_TABLET | Freq: Two times a day (BID) | ORAL | Status: DC

## 2015-01-03 MED ORDER — CLONIDINE HCL 0.1 MG PO TABS: 0.2000 mg | ORAL_TABLET | Freq: Once | ORAL | Status: DC

## 2015-01-03 NOTE — Patient Instructions (Addendum)
Mr. Bargeron,  Thank you for coming in today.    1. HTN: Treated with clonidine 0.2 mg today. Start lasix 40 mg twice daily with potassium  STOP spironolactone  Continue cozaar and coreg. Restart pepcid for heart burn. Restart daily aspirin.   Make an appointment to follow up with Dr. Verl Blalock in 3-4 weeks.  F/u with me in 3 months   Dr. Adrian Blackwater

## 2015-01-03 NOTE — Progress Notes (Signed)
Patient here to follow up on HTN Patient complains of intermittent chest pain that is burning in nature Patient complains of b/l numbness in hands Needs med refills

## 2015-01-03 NOTE — Progress Notes (Signed)
   Subjective:    Patient ID: Joel Beck, male    DOB: Nov 21, 1956, 59 y.o.   MRN: [id number] CC: f/u HTN HPI 59 yo M with rEFCHF f/u: Spanish interpreter present  1. CHRONIC HYPERTENSION  Disease Monitoring  Blood pressure range: does not check   Chest pain: yes, intermittent sharp L sided CP at rest not compliant with ASA or H2 blocker   Dyspnea: no   Claudication: no   Medication compliance: yes, but no medications today   Medication Side Effects  Lightheadedness: no, but admits to HA when he take aldactone    Urinary frequency: yes, on diuretic therapy    Edema: no   Preventitive Healthcare:  Salt Restriction: yes   Soc Hx: former smoker quit in 1978 Review of Systems As per HPI      Objective:   Physical Exam BP 158/114 mmHg  Pulse 84  Temp(Src) 98.3 F (36.8 C)  Resp 16  Ht 5\' 11"  (1.803 m)  Wt 187 lb (84.823 kg)  BMI 26.09 kg/m2  SpO2 97%  BP Readings from Last 3 Encounters:  01/03/15 158/114  09/02/14 147/89  08/18/14 136/91   Wt Readings from Last 3 Encounters:  01/03/15 187 lb (84.823 kg)  09/02/14 187 lb (84.823 kg)  08/11/14 181 lb (82.101 kg)    General appearance: alert, cooperative and no distress Lungs: clear to auscultation bilaterally Heart: regular rate and rhythm, S1, S2 normal, no murmur, click, rub or gallop   Treated with clonidine 0.2 mg PO x one, repeat BP 137/94     Assessment & Plan:

## 2015-01-03 NOTE — Assessment & Plan Note (Signed)
A: BP above goal w.o evidence of decompensated heart failure. Treated with clonidine 0.2 mg today, BP improved.  P: Start lasix 40 mg twice daily with potassium, BMP today  STOP spironolactone  Continue cozaar and coreg. Restart pepcid for heart burn. Restart daily aspirin.

## 2015-01-04 ENCOUNTER — Telehealth: Payer: Self-pay | Admitting: *Deleted

## 2015-01-04 LAB — BASIC METABOLIC PANEL
BUN: 21 mg/dL (ref 6–23)
CHLORIDE: 104 meq/L (ref 96–112)
CO2: 26 meq/L (ref 19–32)
Calcium: 9.1 mg/dL (ref 8.4–10.5)
Creat: 1.16 mg/dL (ref 0.50–1.35)
Glucose, Bld: 83 mg/dL (ref 70–99)
POTASSIUM: 4.6 meq/L (ref 3.5–5.3)
Sodium: 139 mEq/L (ref 135–145)

## 2015-01-04 NOTE — Telephone Encounter (Signed)
-----   Message from Minerva Ends, MD sent at 01/04/2015  9:19 AM EST ----- Normal BMP

## 2015-01-04 NOTE — Telephone Encounter (Signed)
Unable to contact Pt, wrong number

## 2015-02-02 ENCOUNTER — Ambulatory Visit: Payer: Medicaid Other | Admitting: Cardiology

## 2015-02-09 ENCOUNTER — Ambulatory Visit: Payer: Medicaid Other | Attending: Cardiology | Admitting: Cardiology

## 2015-02-09 ENCOUNTER — Encounter: Payer: Self-pay | Admitting: Cardiology

## 2015-02-09 VITALS — BP 153/104 | HR 80 | Temp 98.6°F | Resp 18 | Ht 70.0 in | Wt 185.0 lb

## 2015-02-09 DIAGNOSIS — I5022 Chronic systolic (congestive) heart failure: Secondary | ICD-10-CM | POA: Diagnosis not present

## 2015-02-09 DIAGNOSIS — I340 Nonrheumatic mitral (valve) insufficiency: Secondary | ICD-10-CM | POA: Diagnosis not present

## 2015-02-09 DIAGNOSIS — I10 Essential (primary) hypertension: Secondary | ICD-10-CM

## 2015-02-09 DIAGNOSIS — I509 Heart failure, unspecified: Secondary | ICD-10-CM

## 2015-02-09 DIAGNOSIS — Z9114 Patient's other noncompliance with medication regimen: Secondary | ICD-10-CM | POA: Diagnosis not present

## 2015-02-09 DIAGNOSIS — I110 Hypertensive heart disease with heart failure: Secondary | ICD-10-CM

## 2015-02-09 DIAGNOSIS — Z7982 Long term (current) use of aspirin: Secondary | ICD-10-CM | POA: Insufficient documentation

## 2015-02-09 MED ORDER — LOSARTAN POTASSIUM 100 MG PO TABS: 100.0000 mg | ORAL_TABLET | Freq: Every day | ORAL | Status: DC

## 2015-02-09 MED ORDER — FUROSEMIDE 40 MG PO TABS: 40.0000 mg | ORAL_TABLET | Freq: Every day | ORAL | Status: DC

## 2015-02-09 MED ORDER — POTASSIUM CHLORIDE CRYS ER 20 MEQ PO TBCR: 20.0000 meq | EXTENDED_RELEASE_TABLET | Freq: Every day | ORAL | Status: DC

## 2015-02-09 NOTE — Patient Instructions (Addendum)
It was great seeing you again today. Please begin taking Lasix 40 mg ONCE DAILY, Potassium chloride 20 mEq ONCE DAILY, and Losartan (Cozaar) 100 mg daily in the morning. Please begin weighing yourself daily. If your weight increases more than 3 pounds take an extra Lasix that day. Please return to see Dr. Verl Blalock in 3 months.

## 2015-02-09 NOTE — Progress Notes (Signed)
Joel Beck returns today for the evaluation and management of his chronic systolic heart failure. He has global hypokinesia with LV dilatation, ejection fraction of 25%, mild to moderate mitral regurgitation, left atrial enlargement. We feel most of this is due to hypertensive heart disease. He has a long history of noncompliance.  He recently reported to the primary care clinic here and was found to have again noncompliance his medications.  His medications were adjusted as noted in the last note.  He does not weigh himself but once every 2 weeks. We have reviewed this at length in the past.  Blood work was stable on last visit.  Exam shows him to be in no acute distress. There is no JVD. Carotids are full without bruits. Lungs are clear to auscultation. Heart reveals a normal S1-S2 without gallop. Soft systolic murmur at the apex. Abdominal exam is flat with good bowel sounds. Extremities: No edema.

## 2015-02-09 NOTE — Assessment & Plan Note (Signed)
This problem is clearly not resolved. He has hypertensive heart disease with congestive heart failure.

## 2015-02-09 NOTE — Assessment & Plan Note (Signed)
As I explained to him yet once more today taking his medications and being compliant with weighing himself and his diet is key to preventing progression of his congestive heart failure. I have decreased his Lasix to once a day and potassium to once a day. We will increase his losartan to 100 mg a day for better blood pressure control. All questions and points emphasized with interpreter in the room. I will see him back in 3 months.

## 2015-02-09 NOTE — Progress Notes (Signed)
Patient presents for follow-up of  hypertension, chronic systolic heart failure. Patient denies chest pain, shortness of breath, headaches, swelling, dizziness.   Patient complains of numbness in hands, worse in left hand.  Patient has had numbness in hands since 9/15. Patient indicates he has a scale but does not weigh himself daily. Usually weighs himself 1x/2 weeks. Interpretor present for language translation.

## 2015-06-01 ENCOUNTER — Ambulatory Visit: Payer: Medicaid Other | Attending: Cardiology | Admitting: Cardiology

## 2015-06-01 ENCOUNTER — Encounter: Payer: Self-pay | Admitting: Cardiology

## 2015-06-01 VITALS — BP 138/110 | HR 82 | Temp 98.4°F | Resp 18 | Ht 70.0 in | Wt 191.4 lb

## 2015-06-01 DIAGNOSIS — I5022 Chronic systolic (congestive) heart failure: Secondary | ICD-10-CM | POA: Insufficient documentation

## 2015-06-01 DIAGNOSIS — I10 Essential (primary) hypertension: Secondary | ICD-10-CM

## 2015-06-01 DIAGNOSIS — I110 Hypertensive heart disease with heart failure: Secondary | ICD-10-CM | POA: Insufficient documentation

## 2015-06-01 MED ORDER — LOSARTAN POTASSIUM 100 MG PO TABS: 100.0000 mg | ORAL_TABLET | Freq: Every day | ORAL | Status: DC

## 2015-06-01 MED ORDER — CARVEDILOL 12.5 MG PO TABS: 12.5000 mg | ORAL_TABLET | Freq: Two times a day (BID) | ORAL | Status: DC

## 2015-06-01 MED ORDER — FUROSEMIDE 40 MG PO TABS: 40.0000 mg | ORAL_TABLET | ORAL | Status: DC | PRN

## 2015-06-01 NOTE — Assessment & Plan Note (Signed)
Heart failure stable. He will take Lasix only when necessary for edema in the morning. We made this very clear to him with an interpreter. For his uncontrolled hypertension and heart failure, we have increased his carvedilol to 12-1/2 mg twice a day. We've also increased his losartan at the dose recommended at last visit 200 mg every morning. We have taken his potassium away. I will see him back in 4 weeks and will probably add spironolactone 25 mg every morning for blood pressure and for mortality reduction.

## 2015-06-01 NOTE — Patient Instructions (Signed)
Thank you for coming in today. Medications have been refilled and sent to pharmacy.

## 2015-06-01 NOTE — Progress Notes (Signed)
Joel Beck returns today for evaluation and management of his chronic systolic heart failure secondary to a nonischemic hypertensive heart disease.  He does not take Lasix and potassium because it causes headaches. His last Lasix was over a week ago. He denies orthopnea, dyspnea on exertion, PND or edema.  His blood pressure, diastolic, is poorly controlled. He did not increase his losartan as recommended on last visit. He did take his losartan 50 mg this morning and his carvedilol.  His exam is in no acute distress. There is no JVD. Lungs are clear to auscultation. Cardiac exam reveals a displaced PMI. He has normal S1-S2 with no gallop. Abdominal exam is nondistended with good bowel sounds. Lower extremities show no edema with good pulses.

## 2015-06-01 NOTE — Progress Notes (Signed)
Patient here for 3 month follow up with Dr. Verl Blalock.  Patient denies chest pain, tightness, pressure, SOB.  Patient reports feeling tired when he walks up stairs.  Patient complains of numbness in Left thumb, pointer, and middle fingers.  Patient states Furosemide and Potassium Chloride give him headaches, so he only takes them occasionally.  Patient did not take them today.  Patient was instructed at previous visit to increase Losartan to 100mg .  Patient is only taking 50mg  at this time.  Patient took Aspirin, Carvedilol, Famotidine, and 54m Losartan today.  Patient manual BP 138/110, HR 82.  Patient does not weigh himself daily (last weighed at previous office visit in March) and does not add salt to his foods.

## 2015-06-09 ENCOUNTER — Emergency Department (HOSPITAL_COMMUNITY): Payer: No Typology Code available for payment source

## 2015-06-09 ENCOUNTER — Encounter (HOSPITAL_COMMUNITY): Payer: Self-pay | Admitting: Emergency Medicine

## 2015-06-09 ENCOUNTER — Emergency Department (HOSPITAL_COMMUNITY)
Admission: EM | Admit: 2015-06-09 | Discharge: 2015-06-10 | Disposition: A | Discharge status: Home / Self Care | Payer: No Typology Code available for payment source | Attending: Emergency Medicine | Admitting: Emergency Medicine

## 2015-06-09 DIAGNOSIS — I509 Heart failure, unspecified: Secondary | ICD-10-CM | POA: Insufficient documentation

## 2015-06-09 DIAGNOSIS — S39012A Strain of muscle, fascia and tendon of lower back, initial encounter: Secondary | ICD-10-CM | POA: Insufficient documentation

## 2015-06-09 DIAGNOSIS — Z9889 Other specified postprocedural states: Secondary | ICD-10-CM | POA: Insufficient documentation

## 2015-06-09 DIAGNOSIS — Z87891 Personal history of nicotine dependence: Secondary | ICD-10-CM | POA: Diagnosis not present

## 2015-06-09 DIAGNOSIS — Y998 Other external cause status: Secondary | ICD-10-CM | POA: Diagnosis not present

## 2015-06-09 DIAGNOSIS — Y92410 Unspecified street and highway as the place of occurrence of the external cause: Secondary | ICD-10-CM | POA: Diagnosis not present

## 2015-06-09 DIAGNOSIS — Y9389 Activity, other specified: Secondary | ICD-10-CM | POA: Insufficient documentation

## 2015-06-09 DIAGNOSIS — S8992XA Unspecified injury of left lower leg, initial encounter: Secondary | ICD-10-CM | POA: Insufficient documentation

## 2015-06-09 DIAGNOSIS — M25521 Pain in right elbow: Secondary | ICD-10-CM | POA: Diagnosis not present

## 2015-06-09 DIAGNOSIS — Z7982 Long term (current) use of aspirin: Secondary | ICD-10-CM | POA: Insufficient documentation

## 2015-06-09 DIAGNOSIS — S199XXA Unspecified injury of neck, initial encounter: Secondary | ICD-10-CM | POA: Diagnosis present

## 2015-06-09 DIAGNOSIS — S161XXA Strain of muscle, fascia and tendon at neck level, initial encounter: Secondary | ICD-10-CM | POA: Diagnosis not present

## 2015-06-09 DIAGNOSIS — S59901A Unspecified injury of right elbow, initial encounter: Secondary | ICD-10-CM | POA: Diagnosis not present

## 2015-06-09 DIAGNOSIS — I10 Essential (primary) hypertension: Secondary | ICD-10-CM | POA: Diagnosis not present

## 2015-06-09 DIAGNOSIS — Z79899 Other long term (current) drug therapy: Secondary | ICD-10-CM | POA: Diagnosis not present

## 2015-06-09 NOTE — ED Notes (Signed)
Pt returned from xray

## 2015-06-09 NOTE — ED Notes (Signed)
Patient transported to X-ray 
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
## 2015-06-10 MED ORDER — HYDROCODONE-ACETAMINOPHEN 5-325 MG PO TABS: 1.0000 | ORAL_TABLET | Freq: Four times a day (QID) | ORAL | Status: DC | PRN

## 2015-06-10 MED ORDER — METHOCARBAMOL 500 MG PO TABS: 500.0000 mg | ORAL_TABLET | Freq: Two times a day (BID) | ORAL | Status: DC

## 2015-06-10 MED ORDER — IBUPROFEN 800 MG PO TABS
800.0000 mg | ORAL_TABLET | Freq: Once | ORAL | Status: AC
  Administered 2015-06-10: 800 mg via ORAL
  Filled 2015-06-10: qty 1

## 2015-06-10 MED ORDER — METHOCARBAMOL 500 MG PO TABS
500.0000 mg | ORAL_TABLET | Freq: Once | ORAL | Status: AC
  Administered 2015-06-10: 500 mg via ORAL
  Filled 2015-06-10: qty 1

## 2015-06-10 MED ORDER — OXYCODONE-ACETAMINOPHEN 5-325 MG PO TABS
2.0000 | ORAL_TABLET | Freq: Once | ORAL | Status: AC
  Administered 2015-06-10: 2 via ORAL
  Filled 2015-06-10: qty 2

## 2015-06-10 MED ORDER — NAPROXEN 500 MG PO TABS: 500.0000 mg | ORAL_TABLET | Freq: Two times a day (BID) | ORAL | Status: DC

## 2015-06-10 NOTE — Discharge Instructions (Signed)
1. Medications: robaxin, naproxyn, vicodin, usual home medications 2. Treatment: rest, drink plenty of fluids, gentle stretching as discussed, alternate ice and heat 3. Follow Up: Please followup with your primary doctor in 3 days for discussion of your diagnoses and further evaluation after today's visit; if you do not have a primary care doctor use the resource guide provided to find one;  Return to the ER for worsening back pain, difficulty walking, loss of bowel or bladder control or other concerning symptoms     Ejercicios para la espalda (Back Exercises) Estos ejercicios ayudan a tratar y prevenir lesiones en la espalda. El objetivo es aumentar la fuerza de los msculos abdominales y dorsales y la flexibilidad de la espalda. Debe comenzar con estos ejercicios cuando ya no tenga dolor. Los ejercicios para la espalda incluyen:  Inclinacin de la pelvis - Recustese sobre la espalda con las rodillas flexionadas. Incline la pelvis hasta que la parte inferior de la espalda se apoye en el piso. Mantenga esta posicin durante 5 a 10 segundos y repita entre 5 y 79 veces.  Rodilla al pecho - Empuje primero una rodilla contra el pecho y [place] 20 a 30 segundos; repita con la otra rodilla y luego con ambas a la vez. Esto puede realizarlo con la otra pierna extendida o flexionada, del modo en que se sienta ms cmodo.  Abdominales o despegar el cccix del suelo empleando la musculatura abdominal - [place] 90 grados. Comience inclinando la pelvis y realice un ejercicio abdominal lento y parcial, elevando el tronco slo entre 5 y 77 grados del suelo. Emplee al [employer] 2 y 3 segundos para cada abdominal. No realice los abdominales con las rodillas extendidas. Si le resulta difcil realizar abdominales parciales, simplemente haga lo que se explic anteriormente, pero slo contraiga los msculos abdominales y Civil engineer, contracting tal como se le ha indicado.  Inclinacin de la cadera -  Recustese sobre la espalda con las rodillas flexionadas a 90 grados. Empjese con los pies y los hombros mientras eleva la cadera un par de centmetros del suelo, [place] durante 10 segundos y repita entre 5 y 10 veces.  Arcos dorsales - Acustese sobre el [place] e impulse el tronco hacia atrs sobre los codos flexionados. Presione lentamente con las manos, formando un arco con la zona inferior de la espalda. Repita entre 3 y 5 veces. Al realizar las repeticiones, luego de un tiempo disminuirn la rigidez y las [place].  Elevacin de los hombros - Acustese hacia abajo con los brazos a los lados del cuerpo. Linganore y Photographer torso contra el suelo mientras eleva lentamente la cabeza y los hombros del suelo. No exagere con los ejercicios, especialmente en el comienzo. Los ejercicios pueden causar alguna molestia leve en la espalda durante algunos minutos; sin embargo, si el dolor es muy intenso, o dura ms de 15 minutos, no siga con la actividad fsica hasta que consulte al profesional que lo asiste. Los problemas en la espalda mejoran de [place] lenta con esta terapia.  Consulte al profesional para que lo ayude a planificar un programa de ejercicios adecuado para su espalda. Document Released: 11/19/2005 Document Revised: 02/11/2012 [facility] Patient Information 2015 [place]. This information is not intended to replace advice given to you by your health care provider. Make sure you discuss any questions you have with your health care provider.

## 2015-06-29 ENCOUNTER — Ambulatory Visit: Payer: Self-pay | Attending: Cardiology | Admitting: Cardiology

## 2015-06-29 ENCOUNTER — Encounter: Payer: Self-pay | Admitting: Cardiology

## 2015-06-29 VITALS — BP 142/97 | HR 75 | Temp 98.3°F | Resp 18 | Ht 70.0 in | Wt 192.8 lb

## 2015-06-29 DIAGNOSIS — I10 Essential (primary) hypertension: Secondary | ICD-10-CM

## 2015-06-29 DIAGNOSIS — I5022 Chronic systolic (congestive) heart failure: Secondary | ICD-10-CM | POA: Insufficient documentation

## 2015-06-29 DIAGNOSIS — I509 Heart failure, unspecified: Secondary | ICD-10-CM

## 2015-06-29 DIAGNOSIS — I110 Hypertensive heart disease with heart failure: Secondary | ICD-10-CM | POA: Insufficient documentation

## 2015-06-29 MED ORDER — CARVEDILOL 25 MG PO TABS: 25.0000 mg | ORAL_TABLET | Freq: Two times a day (BID) | ORAL | Status: DC

## 2015-06-29 NOTE — Progress Notes (Signed)
Patient here for 4 week follow up.  Patient states he has been taking his medications as Dr. Verl Blalock instructed him to do since the last visit.  Patient states he feels fine today.  Patient denies shortness of breath and chest pain at this time. Patient states he took his medications today.  Patient reports when he gets angry he gets chest pains.  Patient states he feels short of breath after walking up the stairs, but after he takes his medication he feels better.   BP 142/97.  Patient reports having an argument with his wife earlier today causing his blood pressure to rise, but has calmed down since.

## 2015-06-29 NOTE — Assessment & Plan Note (Signed)
Stable. Increase carvedilol for better blood pressure control.

## 2015-06-29 NOTE — Assessment & Plan Note (Signed)
We'll increase his carvedilol 25 mg twice a day. Blood pressure check with nurse in 2 weeks. I will see him back again in 3 months.

## 2015-06-29 NOTE — Progress Notes (Signed)
Mr Haneline returns today for close follow-up of his blood pressure. He has hypertensive heart disease with chronic systolic heart failure. He says he took his medications this morning. He only takes Lasix as needed for swelling.  He denies orthopnea PND or edema.  His exam today is unchanged. Blood pressure still not optimally controlled. Note that he did have an argument with his wife before he came.

## 2015-06-29 NOTE — Patient Instructions (Signed)
[facility] pressure check with a nurse

## 2015-07-18 ENCOUNTER — Ambulatory Visit: Payer: Self-pay | Attending: Family Medicine | Admitting: *Deleted

## 2015-07-18 DIAGNOSIS — I10 Essential (primary) hypertension: Secondary | ICD-10-CM | POA: Insufficient documentation

## 2015-07-18 DIAGNOSIS — Z87891 Personal history of nicotine dependence: Secondary | ICD-10-CM | POA: Insufficient documentation

## 2015-07-18 NOTE — Progress Notes (Signed)
Spoke with patient via IT sales professional, [place] Patient presents with wife for BP check after increasing coreg to 25 mg bid Also taking losartan 100 mg daily and lasix 40 mg prn swelling. Has not needed to take lasix Med list reviewed; states taking all meds as directed Discussed need for low sodium diet and using Mrs. Dash as alternative to salt Encouraged to choose foods with 5% or less of daily value for sodium. Patient eats canned beans Walking 30 minutes per day for exercise Patient denies headaches, SHOB, chest pain, LE edema  Positive for occasional blurred vision, relieved with visine drops Also c/o hoarseness, admits to yelling frequently. Encouraged to rest voice and drink plenty of water C/o GERD that is relieved by pepcid bid and drinking water  Filed Vitals:   07/18/15 1454  BP: 112/72  Pulse: 82  Temp: 98 F (36.7 C)  Resp: 20     Patient aware that he is overdue for 3 month f/u with PCP (Due May/16). Will schedule appt for next week.

## 2015-07-19 ENCOUNTER — Ambulatory Visit: Payer: No Typology Code available for payment source | Attending: Family Medicine | Admitting: Family Medicine

## 2015-07-19 ENCOUNTER — Encounter: Payer: Self-pay | Admitting: Family Medicine

## 2015-07-19 VITALS — BP 138/88 | HR 78 | Temp 98.8°F | Resp 16 | Ht 70.0 in | Wt 192.0 lb

## 2015-07-19 DIAGNOSIS — Z1159 Encounter for screening for other viral diseases: Secondary | ICD-10-CM

## 2015-07-19 DIAGNOSIS — I5022 Chronic systolic (congestive) heart failure: Secondary | ICD-10-CM

## 2015-07-19 DIAGNOSIS — K219 Gastro-esophageal reflux disease without esophagitis: Secondary | ICD-10-CM | POA: Insufficient documentation

## 2015-07-19 DIAGNOSIS — I509 Heart failure, unspecified: Secondary | ICD-10-CM | POA: Insufficient documentation

## 2015-07-19 DIAGNOSIS — I10 Essential (primary) hypertension: Secondary | ICD-10-CM | POA: Insufficient documentation

## 2015-07-19 DIAGNOSIS — R7989 Other specified abnormal findings of blood chemistry: Secondary | ICD-10-CM

## 2015-07-19 DIAGNOSIS — R748 Abnormal levels of other serum enzymes: Secondary | ICD-10-CM

## 2015-07-19 DIAGNOSIS — Z87891 Personal history of nicotine dependence: Secondary | ICD-10-CM | POA: Insufficient documentation

## 2015-07-19 NOTE — Assessment & Plan Note (Signed)
A: BP at goal Med: compliant P: continue current regimen

## 2015-07-19 NOTE — Progress Notes (Signed)
   Subjective:    Patient ID: Joel Beck, male    DOB: [birth year], 59 y.o.   MRN: [id number] CC: f.u HTN  HPI  CHRONIC HYPERTENSION  Disease Monitoring  Blood pressure range: not checking   Chest pain: no   Dyspnea: no   Claudication: no   Medication compliance: yes  Medication Side Effects  Lightheadedness: no   Urinary frequency: yes   Edema: no     2. CHF: no change in weight. Taking coreg and lasix. No SOB.    3. GERD: still having heartburn. Taking pepcid. Unsure of which foods are triggering his symptoms.    Social History  Substance Use Topics  . Smoking status: Former Smoker    Quit date: 12/03/1976  . Smokeless tobacco: Never Used  . Alcohol Use: 0.6 oz/week    1 Cans of beer per week     Comment: occ   Review of Systems  Constitutional: Negative for fever, chills, fatigue and unexpected weight change.  Eyes: Negative for visual disturbance.  Respiratory: Negative for cough and shortness of breath.   Cardiovascular: Negative for chest pain, palpitations and leg swelling.  Gastrointestinal: Positive for abdominal pain. Negative for nausea and vomiting.       Epigastric   Endocrine: Positive for polyuria.  Skin: Negative for rash.  Allergic/Immunologic: Negative for immunocompromised state.  Psychiatric/Behavioral: Negative for suicidal ideas and dysphoric mood. The patient is not nervous/anxious.        Objective:   Physical Exam  Constitutional: He appears well-developed and well-nourished. No distress.  Eyes: Conjunctivae and EOM are normal. Pupils are equal, round, and reactive to light.  Neck: Normal range of motion. Neck supple.  Cardiovascular: Normal rate, regular rhythm, normal heart sounds and intact distal pulses.   Pulmonary/Chest: Effort normal and breath sounds normal.  Musculoskeletal: He exhibits no edema.  Neurological: He is alert.  Skin: Skin is warm and dry. No rash noted. No erythema.  Psychiatric: He has a normal mood and  affect.  BP 138/88 mmHg  Pulse 78  Temp(Src) 98.8 F (37.1 C) (Oral)  Resp 16  Ht 5\' 10"  (1.778 m)  Wt 192 lb (87.091 kg)  BMI 27.55 kg/m2  SpO2 97% BP Readings from Last 3 Encounters:  07/19/15 139/90  07/18/15 112/72  06/29/15 142/97   Wt Readings from Last 3 Encounters:  07/19/15 192 lb (87.091 kg)  07/18/15 193 lb 3.2 oz (87.635 kg)  06/29/15 192 lb 12.8 oz (87.454 kg)        Assessment & Plan:

## 2015-07-19 NOTE — Assessment & Plan Note (Signed)
A: GERD P: Continue pepcid Diet recommendations given

## 2015-07-19 NOTE — Assessment & Plan Note (Signed)
A: stable compensated CHF Med: compliant P: CMP

## 2015-07-19 NOTE — Assessment & Plan Note (Addendum)
Screening Hep C ordered  Screening Hep C negative

## 2015-07-19 NOTE — Progress Notes (Signed)
F/U HTN  Taking medication as prescribed  No History tobacco

## 2015-07-19 NOTE — Patient Instructions (Addendum)
Mr. Logan,  1. HTN: BP at goal continue current regimen  2. GERD Continue pepcid  F/u in 4-6 weeks for flu shot F/u with me in 4 months for HTN  Dr. Adrian Blackwater   Opciones de alimentos para pacientes con reflujo gastroesofgico (Food Choices for Gastroesophageal Reflux Disease) Cuando se tiene reflujo gastroesofgico (ERGE), los alimentos que se ingieren y los hbitos de alimentacin son Theatre stage manager. Elegir los alimentos adecuados puede ayudar a [employer].  [place]?   Elija las frutas, los vegetales, los cereales integrales y los productos lcteos con bajo contenido de [place].  [place], de pescado y de ave con bajo contenido de grasas.  Limite las grasas, como los [place], los aderezos para [place], la [place], los frutos secos y Publishing copy.  Lleve un registro de alimentos. Esto ayuda a identificar los alimentos que ocasionan sntomas.  Evite los alimentos que le ocasionen sntomas. Pueden ser distintos para cada persona.  Haga comidas pequeas durante Psychiatrist de 3 comidas abundantes.  Coma lentamente, en un lugar donde est distendido.  Limite el consumo de alimentos fritos.  Cocine los alimentos utilizando mtodos que no sean la fritura.  Evite el consumo alcohol.  Evite beber grandes cantidades de lquidos con las comidas.  Evite agacharse o recostarse hasta despus de 2 o 3horas de haber comido. QU ALIMENTOS NO SE RECOMIENDAN?  Estos son algunos alimentos y bebidas que pueden empeorar los sntomas: Astronomer. Jugo de tomate. Salsa de tomate y espagueti. Ajes. Cebolla y [place]. Rbano picante. Frutas Naranjas, pomelos y limn (fruta y [place]). Carnes Carnes de [place], de pescado y de ave con gran contenido de grasas. Esto incluye los perros calientes, las [place], el [place], la salchicha, el salame y el tocino. Lcteos Leche entera y [place]. Rite Aid. Crema. [place]. Helados. Queso  crema.  Bebidas T o caf. Bebidas gaseosas o bebidas energizantes. Condimentos Salsa picante. Salsa barbacoa.  Dulces/postres Chocolate y cacao. Rosquillas. Menta y mentol. Grasas y Albertson's. Esto incluye las papas fritas. Otros Vinag[place]. Especias picantes. Esto incluye la pimienta negra, la pimienta blanca, la pimienta roja, la pimienta de cayena, el curry en polvo, los clavos de [place], el jengibre y el [place] en polvo. Esta no es [employer] de los alimentos y las bebidas que se Higher education careers adviser. Comunquese con el nutricionista para recibir ms informacin. Document Released: 05/20/2012 Document Revised: 11/24/2013 [facility] Patient Information 2015 [place]. This information is not intended to replace advice given to you by your health care provider. Make sure you discuss any questions you have with your health care provider.

## 2015-07-20 ENCOUNTER — Telehealth: Payer: Self-pay | Admitting: *Deleted

## 2015-07-20 DIAGNOSIS — R7989 Other specified abnormal findings of blood chemistry: Secondary | ICD-10-CM | POA: Insufficient documentation

## 2015-07-20 LAB — COMPLETE METABOLIC PANEL WITH GFR
ALBUMIN: 4.1 g/dL (ref 3.6–5.1)
ALT: 15 U/L (ref 9–46)
AST: 14 U/L (ref 10–35)
Alkaline Phosphatase: 56 U/L (ref 40–115)
BUN: 31 mg/dL — ABNORMAL HIGH (ref 7–25)
CO2: 29 mmol/L (ref 20–31)
Calcium: 9.7 mg/dL (ref 8.6–10.3)
Chloride: 104 mmol/L (ref 98–110)
Creat: 1.96 mg/dL — ABNORMAL HIGH (ref 0.70–1.33)
GFR, EST AFRICAN AMERICAN: 43 mL/min — AB (ref >=60)
GFR, Est Non African American: 37 mL/min — ABNORMAL LOW (ref >=60)
GLUCOSE: 86 mg/dL (ref 65–99)
POTASSIUM: 4.8 mmol/L (ref 3.5–5.3)
SODIUM: 142 mmol/L (ref 135–146)
Total Bilirubin: 0.4 mg/dL (ref 0.2–1.2)
Total Protein: 7 g/dL (ref 6.1–8.1)

## 2015-07-20 LAB — HEPATITIS C ANTIBODY: HCV Ab: NEGATIVE

## 2015-07-20 NOTE — Assessment & Plan Note (Signed)
Cr elevated on ARB and lasix, BP normal and patient was well appearing during the xam  Plan: Repeat BMP and UA needed in one week patient to come in for lab visit. If persistently elevated will obtain renal US and refer to renal

## 2015-07-20 NOTE — Telephone Encounter (Signed)
-----   Message from [name], MD sent at 07/20/2015 11:42 AM EDT ----- Hep C negative  Cr elevated on ARB and lasix, BP normal and patient was well appearing during the xam  Plan: Repeat BMP and UA needed in one week patient to come in for lab visit. If persistently elevated will obtain renal US and refer to renal

## 2015-07-20 NOTE — Addendum Note (Signed)
Addended by: [name] on: 07/20/2015 11:44 AM   Modules accepted: Orders

## 2015-07-20 NOTE — Telephone Encounter (Signed)
Unable to contact Pt Phone number incorrect  Send communication letter to pt

## 2015-12-28 ENCOUNTER — Ambulatory Visit: Payer: Self-pay | Admitting: Critical Care Medicine

## 2015-12-28 MED FILL — ?FUROSEMIDE 40 MG TABLET: 40 | 30 days supply | Qty: 30 | Fill #1

## 2015-12-28 MED FILL — LOSARTAN POTASSIUM 100 MG T: 100 | 30 days supply | Qty: 30 | Fill #2 | Status: TO

## 2016-01-09 ENCOUNTER — Ambulatory Visit: Payer: Self-pay | Admitting: Family Medicine

## 2016-01-14 ENCOUNTER — Emergency Department (HOSPITAL_COMMUNITY): Payer: Medicaid Other

## 2016-01-14 ENCOUNTER — Encounter (HOSPITAL_COMMUNITY): Payer: Self-pay | Admitting: *Deleted

## 2016-01-14 ENCOUNTER — Emergency Department (HOSPITAL_COMMUNITY)
Admission: EM | Admit: 2016-01-14 | Discharge: 2016-01-14 | Disposition: A | Discharge status: Home / Self Care | Payer: Medicaid Other | Attending: Emergency Medicine | Admitting: Emergency Medicine

## 2016-01-14 DIAGNOSIS — R509 Fever, unspecified: Secondary | ICD-10-CM | POA: Diagnosis not present

## 2016-01-14 DIAGNOSIS — R52 Pain, unspecified: Secondary | ICD-10-CM

## 2016-01-14 DIAGNOSIS — I509 Heart failure, unspecified: Secondary | ICD-10-CM | POA: Diagnosis not present

## 2016-01-14 DIAGNOSIS — Z9889 Other specified postprocedural states: Secondary | ICD-10-CM | POA: Insufficient documentation

## 2016-01-14 DIAGNOSIS — R05 Cough: Secondary | ICD-10-CM | POA: Insufficient documentation

## 2016-01-14 DIAGNOSIS — Z87891 Personal history of nicotine dependence: Secondary | ICD-10-CM | POA: Insufficient documentation

## 2016-01-14 DIAGNOSIS — Z79899 Other long term (current) drug therapy: Secondary | ICD-10-CM | POA: Insufficient documentation

## 2016-01-14 DIAGNOSIS — Z7982 Long term (current) use of aspirin: Secondary | ICD-10-CM | POA: Diagnosis not present

## 2016-01-14 DIAGNOSIS — M791 Myalgia: Secondary | ICD-10-CM | POA: Diagnosis not present

## 2016-01-14 DIAGNOSIS — I10 Essential (primary) hypertension: Secondary | ICD-10-CM | POA: Insufficient documentation

## 2016-01-14 DIAGNOSIS — R059 Cough, unspecified: Secondary | ICD-10-CM

## 2016-01-14 LAB — URINE MICROSCOPIC-ADD ON

## 2016-01-14 LAB — CBC
HCT: 43.3 % (ref 39.0–52.0)
Hemoglobin: 14.8 g/dL (ref 13.0–17.0)
MCH: 31.3 pg (ref 26.0–34.0)
MCHC: 34.2 g/dL (ref 30.0–36.0)
MCV: 91.5 fL (ref 78.0–100.0)
PLATELETS: 152 10*3/uL (ref 150–400)
RBC: 4.73 MIL/uL (ref 4.22–5.81)
RDW: 12.7 % (ref 11.5–15.5)
WBC: 3.7 10*3/uL — ABNORMAL LOW (ref 4.0–10.5)

## 2016-01-14 LAB — COMPREHENSIVE METABOLIC PANEL
ALT: 22 U/L (ref 17–63)
AST: 27 U/L (ref 15–41)
Albumin: 3.6 g/dL (ref 3.5–5.0)
Alkaline Phosphatase: 61 U/L (ref 38–126)
Anion gap: 11 (ref 5–15)
BILIRUBIN TOTAL: 0.7 mg/dL (ref 0.3–1.2)
BUN: 19 mg/dL (ref 6–20)
CALCIUM: 8.8 mg/dL — AB (ref 8.9–10.3)
CO2: 23 mmol/L (ref 22–32)
Chloride: 103 mmol/L (ref 101–111)
Creatinine, Ser: 1.35 mg/dL — ABNORMAL HIGH (ref 0.61–1.24)
GFR calc non Af Amer: 56 mL/min — ABNORMAL LOW (ref >60)
Glucose, Bld: 96 mg/dL (ref 65–99)
Potassium: 4.2 mmol/L (ref 3.5–5.1)
Sodium: 137 mmol/L (ref 135–145)
TOTAL PROTEIN: 6.9 g/dL (ref 6.5–8.1)

## 2016-01-14 LAB — URINALYSIS, ROUTINE W REFLEX MICROSCOPIC
Bilirubin Urine: NEGATIVE
GLUCOSE, UA: NEGATIVE mg/dL
Hgb urine dipstick: NEGATIVE
Ketones, ur: NEGATIVE mg/dL
LEUKOCYTES UA: NEGATIVE
Nitrite: NEGATIVE
PROTEIN: 30 mg/dL — AB
Specific Gravity, Urine: 1.029 (ref 1.005–1.030)
pH: 5 (ref 5.0–8.0)

## 2016-01-14 MED ORDER — IBUPROFEN 800 MG PO TABS: 800.0000 mg | ORAL_TABLET | Freq: Three times a day (TID) | ORAL | Status: DC

## 2016-01-14 MED ORDER — BENZONATATE 100 MG PO CAPS: 100.0000 mg | ORAL_CAPSULE | Freq: Three times a day (TID) | ORAL | Status: DC

## 2016-01-14 MED ORDER — IBUPROFEN 400 MG PO TABS
800.0000 mg | ORAL_TABLET | Freq: Once | ORAL | Status: AC
  Administered 2016-01-14: 800 mg via ORAL
  Filled 2016-01-14: qty 2

## 2016-01-14 MED ORDER — ONDANSETRON 4 MG PO TBDP: 4.0000 mg | ORAL_TABLET | Freq: Three times a day (TID) | ORAL | Status: DC | PRN

## 2016-01-14 MED ORDER — BENZONATATE 100 MG PO CAPS
100.0000 mg | ORAL_CAPSULE | Freq: Once | ORAL | Status: AC
  Administered 2016-01-14: 100 mg via ORAL
  Filled 2016-01-14: qty 1

## 2016-01-14 NOTE — ED Notes (Signed)
The pt reports that he takes bp med and has been taking it.Marland Kitchen He drank a beer today

## 2016-01-14 NOTE — ED Provider Notes (Signed)
CSN: [id number]     Arrival date & time 01/14/16  1552 History   First MD Initiated Contact with Patient 01/14/16 1609     Chief Complaint  Patient presents with  . Cough     (Consider location/radiation/quality/duration/timing/severity/associated sxs/prior Treatment) HPI   Joel Beck is a 60 y.o. male, with a history of hypertension and CHF, presenting to the ED with dry cough, body aches, and subjective fever for the last 3 days. Patient rates his pain at 5 out of 10. Patient has not taken anything for his symptoms. Patient denies shortness of breath, chest pain, or any other pain or complaints.    Past Medical History  Diagnosis Date  . Hypertension Dx 2015  . CHF (congestive heart failure) ([place]) 07/05/2014   Past Surgical History  Procedure Laterality Date  . Left heart catheterization with coronary angiogram N/A 07/05/2014    Procedure: LEFT HEART CATHETERIZATION WITH CORONARY ANGIOGRAM;  Surgeon: Peter M Martinique, MD;  Location: [facility] CATH LAB;  Service: Cardiovascular;  Laterality: N/A;   Family History  Problem Relation Age of Onset  . Cancer Neg Hx   . Heart disease Neg Hx    Social History  Substance Use Topics  . Smoking status: Former Smoker    Quit date: 12/03/1976  . Smokeless tobacco: Never Used  . Alcohol Use: 0.6 oz/week    1 Cans of beer per week     Comment: occ    Review of Systems  Constitutional: Positive for fever. Negative for chills and diaphoresis.  Respiratory: Positive for cough. Negative for shortness of breath.   Cardiovascular: Negative for chest pain.  Gastrointestinal: Negative for nausea, vomiting and abdominal pain.  Skin: Negative for color change and pallor.  Neurological: Negative for dizziness and light-headedness.  All other systems reviewed and are negative.     Allergies  Ace inhibitors  Home Medications   Prior to Admission medications   Medication Sig Start Date End Date Taking? Authorizing Provider  aspirin EC 81 MG  tablet Take 1 tablet (81 mg total) by mouth daily. 01/03/15   Josalyn Funches, MD  benzonatate (TESSALON) 100 MG capsule Take 1 capsule (100 mg total) by mouth every 8 (eight) hours. 01/14/16   Lilas Diefendorf C Hibah Odonnell, PA-C  carvedilol (COREG) 25 MG tablet Take 1 tablet (25 mg total) by mouth 2 (two) times daily with a meal. 06/29/15   Renella Cunas, MD  famotidine (PEPCID) 20 MG tablet Take 1 tablet (20 mg total) by mouth 2 (two) times daily. 01/03/15   [name], MD  furosemide (LASIX) 40 MG tablet Take 1 tablet (40 mg total) by mouth as needed (with swelling). 06/01/15   Renella Cunas, MD  ibuprofen (ADVIL,MOTRIN) 800 MG tablet Take 1 tablet (800 mg total) by mouth 3 (three) times daily. 01/14/16   Debie Ashline C Jarrel Knoke, PA-C  losartan (COZAAR) 100 MG tablet Take 1 tablet (100 mg total) by mouth daily. 06/01/15   Renella Cunas, MD  ondansetron (ZOFRAN ODT) 4 MG disintegrating tablet Take 1 tablet (4 mg total) by mouth every 8 (eight) hours as needed for nausea or vomiting. 01/14/16   Serge Main C Marvell Stavola, PA-C   BP 163/115 mmHg  Pulse 83  Temp(Src) 99 F (37.2 C)  Resp 16  Ht 5\' 10"  (1.778 m)  Wt 88.48 kg  BMI 27.99 kg/m2  SpO2 99% Physical Exam  Constitutional: He appears well-developed and well-nourished. No distress.  HENT:  Head: Normocephalic and atraumatic.  Right Ear:  Tympanic membrane, external ear and ear canal normal.  Left Ear: Tympanic membrane, external ear and ear canal normal.  Mouth/Throat: Oropharynx is clear and moist.  Eyes: Conjunctivae are normal.  Neck: Normal range of motion. Neck supple.  Cardiovascular: Normal rate, regular rhythm, normal heart sounds and intact distal pulses.   Pulmonary/Chest: Effort normal and breath sounds normal. No respiratory distress.  No increased work of breathing. Patient speaks in full sentences without difficulty.  Abdominal: Soft. Bowel sounds are normal. There is no tenderness. There is no guarding.  Musculoskeletal: He exhibits no edema or tenderness.   Lymphadenopathy:    He has no cervical adenopathy.  Neurological: He is alert.  Skin: Skin is warm and dry. He is not diaphoretic.  Nursing note and vitals reviewed.   ED Course  Procedures (including critical care time) Labs Review Labs Reviewed  CBC - Abnormal; Notable for the following:    WBC 3.7 (*)    All other components within normal limits  COMPREHENSIVE METABOLIC PANEL  URINALYSIS, ROUTINE W REFLEX MICROSCOPIC (NOT AT [facility])    Imaging Review Dg Chest 2 View  01/14/2016  CLINICAL DATA:  Fever, dry cough and headache for 3 days. EXAM: CHEST  2 VIEW COMPARISON:  Chest x-ray dated 07/04/2014. FINDINGS: Cardiomediastinal silhouette is normal in size and configuration. Lungs are clear. Lung volumes are normal. No evidence of pneumonia. No pleural effusion. No pneumothorax. Osseous and soft tissue structures about the chest are unremarkable. IMPRESSION: No active cardiopulmonary disease. Electronically Signed   By: Franki Cabot M.D.   On: 01/14/2016 16:51   I have personally reviewed and evaluated these images and lab results as part of my medical decision-making.   EKG Interpretation None      MDM   Final diagnoses:  Cough  Body aches    [name] presents with cough, subjective fever, and body aches for 3 days.   Patient had a normal physical examination and has no red flag symptoms. Patient is nontoxic appearing, afebrile, not tachycardic, not tachypneic, maintains SPO2 of 99% on room air, and is in no apparent distress. Patient has no signs of sepsis or other serious or life-threatening condition. Patient had a clear chest x-ray. Patient's leukopenia is not to a dangerously low level. Patient will be advised to follow-up with his PCP on this matter, as well as his hypertension, to which he is asymptomatic. The patient was given instructions for home care as well as return precautions. Patient voices understanding of these instructions, accepts the plan, and is  comfortable with discharge.  Filed Vitals:   01/14/16 1558 01/14/16 1601  BP: 169/124 163/115  Pulse: 83   Temp: 99 F (37.2 C)   Resp: 16   Height: 5\' 10"  (1.778 m)   Weight: 88.48 kg   SpO2: 99%       Lorayne Bender, PA-C 01/14/16 1719  Davonna Belling, MD 01/14/16 2357

## 2016-01-14 NOTE — ED Notes (Signed)
The pt is c/o a cough cold and fever for 3 days.   Non-productive cough

## 2016-01-14 NOTE — Discharge Instructions (Signed)
You have been seen today for cough and body aches. Your imaging showed no abnormalities. Your symptoms are most likely caused by a viral illness. Viruses do not require antibiotics. Treatment is supportive care. Drink plenty of fluids and get plenty of rest. Tessalon for cough. Zofran for nausea. Ibuprofen or Tylenol for pain or fever. Return to ED should symptoms worsen. On your lab tests, a lower than normal white blood cell count was found. It is not a dangerously low level. This and your hypertension should be followed up on by your PCP.

## 2016-03-15 ENCOUNTER — Ambulatory Visit: Payer: Medicaid Other | Attending: Family Medicine | Admitting: Family Medicine

## 2016-03-15 ENCOUNTER — Encounter: Payer: Self-pay | Admitting: Family Medicine

## 2016-03-15 VITALS — BP 144/107 | HR 88 | Temp 98.8°F | Resp 16 | Ht 66.0 in | Wt 195.0 lb

## 2016-03-15 DIAGNOSIS — Z79899 Other long term (current) drug therapy: Secondary | ICD-10-CM | POA: Insufficient documentation

## 2016-03-15 DIAGNOSIS — I5022 Chronic systolic (congestive) heart failure: Secondary | ICD-10-CM | POA: Diagnosis not present

## 2016-03-15 DIAGNOSIS — I110 Hypertensive heart disease with heart failure: Secondary | ICD-10-CM | POA: Insufficient documentation

## 2016-03-15 DIAGNOSIS — Z7982 Long term (current) use of aspirin: Secondary | ICD-10-CM | POA: Diagnosis not present

## 2016-03-15 DIAGNOSIS — R0602 Shortness of breath: Secondary | ICD-10-CM | POA: Insufficient documentation

## 2016-03-15 DIAGNOSIS — I10 Essential (primary) hypertension: Secondary | ICD-10-CM

## 2016-03-15 DIAGNOSIS — Z87891 Personal history of nicotine dependence: Secondary | ICD-10-CM | POA: Insufficient documentation

## 2016-03-15 DIAGNOSIS — K219 Gastro-esophageal reflux disease without esophagitis: Secondary | ICD-10-CM | POA: Diagnosis not present

## 2016-03-15 LAB — BASIC METABOLIC PANEL
BUN: 17 mg/dL (ref 7–25)
CHLORIDE: 107 mmol/L (ref 98–110)
CO2: 26 mmol/L (ref 20–31)
Calcium: 9.3 mg/dL (ref 8.6–10.3)
Creat: 1.13 mg/dL (ref 0.70–1.33)
Glucose, Bld: 96 mg/dL (ref 65–99)
Potassium: 4.5 mmol/L (ref 3.5–5.3)
SODIUM: 140 mmol/L (ref 135–146)

## 2016-03-15 LAB — LIPID PANEL
Cholesterol: 183 mg/dL (ref 125–200)
HDL: 35 mg/dL — ABNORMAL LOW (ref >=40)
LDL CALC: 86 mg/dL (ref <130)
Total CHOL/HDL Ratio: 5.2 Ratio — ABNORMAL HIGH (ref <=5.0)
Triglycerides: 311 mg/dL — ABNORMAL HIGH (ref <150)
VLDL: 62 mg/dL — AB (ref <30)

## 2016-03-15 LAB — POCT GLYCOSYLATED HEMOGLOBIN (HGB A1C): Hemoglobin A1C: 5.2

## 2016-03-15 MED ORDER — FUROSEMIDE 40 MG PO TABS: 40.0000 mg | ORAL_TABLET | Freq: Every day | ORAL | Status: DC | PRN

## 2016-03-15 MED ORDER — CARVEDILOL 25 MG PO TABS: 25.0000 mg | ORAL_TABLET | Freq: Two times a day (BID) | ORAL | Status: DC

## 2016-03-15 MED ORDER — FAMOTIDINE 20 MG PO TABS: 20.0000 mg | ORAL_TABLET | Freq: Two times a day (BID) | ORAL | Status: DC

## 2016-03-15 MED ORDER — SPIRONOLACTONE 25 MG PO TABS: 25.0000 mg | ORAL_TABLET | Freq: Every day | ORAL | Status: DC

## 2016-03-15 MED ORDER — ASPIRIN EC 81 MG PO TBEC: 81.0000 mg | DELAYED_RELEASE_TABLET | Freq: Every day | ORAL | Status: AC

## 2016-03-15 MED ORDER — FUROSEMIDE 40 MG PO TABS: 40.0000 mg | ORAL_TABLET | Freq: Every day | ORAL | Status: DC

## 2016-03-15 MED ORDER — LOSARTAN POTASSIUM 100 MG PO TABS: 100.0000 mg | ORAL_TABLET | Freq: Every day | ORAL | Status: DC

## 2016-03-15 NOTE — Progress Notes (Signed)
Subjective:  Patient ID: Joel Beck, male    DOB: [birth year]  Age: 60 y.o. MRN: [id number]  CC: Hypertension   HPI Joel Beck presents has CHFrEF and HTN he presents for f/u:    1. CHRONIC HYPERTENSION  Disease Monitoring  Blood pressure range: not checking   Chest pain: no   Dyspnea: yes   Claudication: no   Medication compliance: yes, taking lasix prn SOB with exertion now  Medication Side Effects  Lightheadedness: no   Urinary frequency: no   Edema: yes, L ankle    Impotence:    Preventitive Healthcare:  Exercise: yes   Diet Pattern:   Salt Restriction: yes   2. CHFrEF: last ECHO in 2015 revealed EF of 25 %. He reports med compliance. He previously saw a cardiologist at this clinic but currently is not followed by cardiology. No CP or worsening swelling. Has SOB with exertion. Denies orthopnea or PND.   Social History  Substance Use Topics  . Smoking status: Former Smoker    Quit date: 12/03/1976  . Smokeless tobacco: Never Used  . Alcohol Use: 0.6 oz/week    1 Cans of beer per week     Comment: occ    Outpatient Prescriptions Prior to Visit  Medication Sig Dispense Refill  . aspirin EC 81 MG tablet Take 1 tablet (81 mg total) by mouth daily. 90 tablet 1  . carvedilol (COREG) 25 MG tablet Take 1 tablet (25 mg total) by mouth 2 (two) times daily with a meal. 60 tablet 11  . famotidine (PEPCID) 20 MG tablet Take 1 tablet (20 mg total) by mouth 2 (two) times daily. 180 tablet 1  . furosemide (LASIX) 40 MG tablet Take 1 tablet (40 mg total) by mouth as needed (with swelling). 30 tablet 11  . ibuprofen (ADVIL,MOTRIN) 800 MG tablet Take 1 tablet (800 mg total) by mouth 3 (three) times daily. 21 tablet 0  . losartan (COZAAR) 100 MG tablet Take 1 tablet (100 mg total) by mouth daily. 30 tablet 11  . benzonatate (TESSALON) 100 MG capsule Take 1 capsule (100 mg total) by mouth every 8 (eight) hours. 21 capsule 0  . ondansetron (ZOFRAN ODT) 4 MG disintegrating  tablet Take 1 tablet (4 mg total) by mouth every 8 (eight) hours as needed for nausea or vomiting. 20 tablet 0   No facility-administered medications prior to visit.    ROS Review of Systems  Constitutional: Negative for fever, chills, fatigue and unexpected weight change.  Eyes: Negative for visual disturbance.  Respiratory: Positive for shortness of breath. Negative for cough.   Cardiovascular: Negative for chest pain, palpitations and leg swelling.  Gastrointestinal: Negative for nausea, vomiting, abdominal pain, diarrhea, constipation and blood in stool.  Endocrine: Negative for polydipsia, polyphagia and polyuria.  Musculoskeletal: Negative for myalgias, back pain, arthralgias, gait problem and neck pain.  Skin: Negative for rash.  Allergic/Immunologic: Negative for immunocompromised state.  Hematological: Negative for adenopathy. Does not bruise/bleed easily.  Psychiatric/Behavioral: Negative for suicidal ideas, sleep disturbance and dysphoric mood. The patient is not nervous/anxious.     Objective:  BP 144/107 mmHg  Pulse 88  Temp(Src) 98.8 F (37.1 C) (Oral)  Resp 16  Ht 5\' 6"  (1.676 m)  Wt 195 lb (88.451 kg)  BMI 31.49 kg/m2  SpO2 98%  BP/Weight 03/15/2016 01/14/2016 99991111  Systolic BP 123456 [id number] 0000000  Diastolic BP XX123456 0000000 88  Wt. (Lbs) 195 195.06 192  BMI 31.49 27.99 27.55   Physical  Exam  Constitutional: He appears well-developed and well-nourished. No distress.  HENT:  Head: Normocephalic and atraumatic.  Neck: Normal range of motion. Neck supple.  Cardiovascular: Normal rate, regular rhythm, normal heart sounds and intact distal pulses.   Pulmonary/Chest: Effort normal and breath sounds normal.  Musculoskeletal: He exhibits no edema.  Neurological: He is alert.  Skin: Skin is warm and dry. No rash noted. No erythema.  Psychiatric: He has a normal mood and affect.    Assessment & Plan:   There are no diagnoses linked to this encounter. Kaimani was seen  today for hypertension.  Diagnoses and all orders for this visit:  Accelerated hypertension -     HgB A1c -     Basic Metabolic Panel -     aspirin EC 81 MG tablet; Take 1 tablet (81 mg total) by mouth daily. -     carvedilol (COREG) 25 MG tablet; Take 1 tablet (25 mg total) by mouth 2 (two) times daily with a meal. -     Discontinue: furosemide (LASIX) 40 MG tablet; Take 1 tablet (40 mg total) by mouth daily. -     losartan (COZAAR) 100 MG tablet; Take 1 tablet (100 mg total) by mouth daily. -     Lipid Panel -     furosemide (LASIX) 40 MG tablet; Take 1 tablet (40 mg total) by mouth daily as needed for edema.  Chronic systolic CHF (congestive heart failure) (HCC) -     Basic Metabolic Panel -     Ambulatory referral to Cardiology -     Lipid Panel -     spironolactone (ALDACTONE) 25 MG tablet; Take 1 tablet (25 mg total) by mouth daily. -     Echocardiogram; Future  Gastroesophageal reflux disease, esophagitis presence not specified -     famotidine (PEPCID) 20 MG tablet; Take 1 tablet (20 mg total) by mouth 2 (two) times daily.  Hypertensive heart disease with congestive heart failure ([place])   Meds ordered this encounter  Medications  . aspirin EC 81 MG tablet    Sig: Take 1 tablet (81 mg total) by mouth daily.    Dispense:  90 tablet    Refill:  3  . carvedilol (COREG) 25 MG tablet    Sig: Take 1 tablet (25 mg total) by mouth 2 (two) times daily with a meal.    Dispense:  60 tablet    Refill:  11  . DISCONTD: furosemide (LASIX) 40 MG tablet    Sig: Take 1 tablet (40 mg total) by mouth daily.    Dispense:  30 tablet    Refill:  11  . losartan (COZAAR) 100 MG tablet    Sig: Take 1 tablet (100 mg total) by mouth daily.    Dispense:  30 tablet    Refill:  11  . famotidine (PEPCID) 20 MG tablet    Sig: Take 1 tablet (20 mg total) by mouth 2 (two) times daily.    Dispense:  180 tablet    Refill:  3  . spironolactone (ALDACTONE) 25 MG tablet    Sig: Take 1 tablet (25 mg  total) by mouth daily.    Dispense:  90 tablet    Refill:  3  . furosemide (LASIX) 40 MG tablet    Sig: Take 1 tablet (40 mg total) by mouth daily as needed for edema.    Dispense:  30 tablet    Refill:  11    Follow-up: No Follow-up on file.  [name] MD

## 2016-03-15 NOTE — Patient Instructions (Addendum)
Hatcher was seen today for hypertension.  Diagnoses and all orders for this visit:  Accelerated hypertension -     HgB A1c -     Basic Metabolic Panel -     carvedilol (COREG) 25 MG tablet; Take 1 tablet (25 mg total) by mouth 2 (two) times daily with a meal. -     Discontinue: furosemide (LASIX) 40 MG tablet; Take 1 tablet (40 mg total) by mouth daily. -     losartan (COZAAR) 100 MG tablet; Take 1 tablet (100 mg total) by mouth daily. -     spironolactone (ALDACTONE) 25 MG tablet; Take 1 tablet (25 mg total) by mouth daily. -     furosemide (LASIX) 40 MG tablet; Take 1 tablet (40 mg total) by mouth daily as needed for edema.  Chronic systolic CHF (congestive heart failure) ([place]) -     Ambulatory referral to Cardiology -     Echocardiogram; Future  Gastroesophageal reflux disease, esophagitis presence not specified -     famotidine (PEPCID) 20 MG tablet; Take 1 tablet (20 mg total) by mouth 2 (two) times daily.  Hypertensive heart disease with congestive heart failure (HCC) -     aspirin EC 81 MG tablet; Take 1 tablet (81 mg total) by mouth daily. -     carvedilol (COREG) 25 MG tablet; Take 1 tablet (25 mg total) by mouth 2 (two) times daily with a meal. -     losartan (COZAAR) 100 MG tablet; Take 1 tablet (100 mg total) by mouth daily. -     Lipid Panel -     spironolactone (ALDACTONE) 25 MG tablet; Take 1 tablet (25 mg total) by mouth daily. -     Echocardiogram; Future -     furosemide (LASIX) 40 MG tablet; Take 1 tablet (40 mg total) by mouth daily as needed for edema.  ECHO to check on well your heart is pumping Cardiology referral.  Start aldactone 25 mg daily this will help lower BP to goal and pull extra fluid off. Continue lasix 40 mg daily as needed for swelling.   F/u BMP, lab visit in 2 weeks to check potassium   F/u with me in 4 weeks for HTN  Dr. Adrian Blackwater

## 2016-03-15 NOTE — Assessment & Plan Note (Addendum)
A; persistent HTN in patient with rEF, CHF without evidence of acute CHF exacerabation P: Continue lasix 40 mg daily prn swelling Add spironolactone 25 mg daily  Continue coreg 25 mg BID Continue losartan 100 mg daily Continue daily ASA BMP  Lipids

## 2016-03-20 MED ORDER — ATORVASTATIN CALCIUM 40 MG PO TABS
40.0000 mg | ORAL_TABLET | Freq: Every day | ORAL | Status: DC
Start: 2016-03-20 — End: 2016-04-13

## 2016-03-20 NOTE — Addendum Note (Signed)
Addended by: [name] on: 03/20/2016 04:12 PM   Modules accepted: Orders, SmartSet

## 2016-03-21 ENCOUNTER — Telehealth: Payer: Self-pay | Admitting: *Deleted

## 2016-03-21 NOTE — Telephone Encounter (Signed)
-----   Message from [name], MD sent at 03/20/2016  4:11 PM EDT ----- BMP normal Elevated triglycerides on non-fasting lipids Please start lipitor 40 mg daily for heart protection

## 2016-03-21 NOTE — Telephone Encounter (Signed)
Date of birth verified by pt  BMP results given  Elevated triglyceride on fasting labs Rx lipitor at [facility]  Pt verbalized understanding  Information given in Spanish

## 2016-03-22 ENCOUNTER — Ambulatory Visit (HOSPITAL_COMMUNITY): Admission: RE | Admit: 2016-03-22 | Payer: Self-pay | Source: Ambulatory Visit

## 2016-04-09 ENCOUNTER — Ambulatory Visit (HOSPITAL_COMMUNITY): Payer: Self-pay | Attending: Internal Medicine

## 2016-04-12 NOTE — Progress Notes (Signed)
Patient ID: Joel Beck, male   DOB: 04/19/1956, 60 y.o.   MRN: [id number]     Cardiology Office Note   Date:  04/12/2016   ID:  Joel Beck, DOB Apr 19, 1956, MRN [id number]  PCP:  Minerva Ends, MD  Cardiologist:   Jenkins Rouge, MD   No chief complaint on file.     History of Present Illness: Joel Beck is a 60 y.o. male who presents for evaluation of chronic systolic heart failure Previously seen by Dr Verl Blalock  Last echo 07/05/14  Reviewed  Cath by Dr Martinique 2015 with no CAD Long standing non ischemic cardiomyopathy.    Study Conclusions  - Left ventricle: High sphericity index. The cavity size was severely dilated. Wall thickness was normal. The estimated ejection fraction was 25%. Diffuse hypokinesis. Doppler parameters are consistent with both elevated ventricular end-diastolic filling pressure and elevated left atrial filling pressure. - Aortic valve: There was trivial regurgitation. - Mitral valve: There was mild regurgitation. - Left atrium: The atrium was moderately dilated. - Atrial septum: No defect or patent foramen ovale was identified.  More dyspnea. Especially having sex with wife. Compliant with meds  Has low sodium diet Currently not working  Some coughing and PND/orthopnea   Past Medical History  Diagnosis Date  . Hypertension Dx 2015  . CHF (congestive heart failure) ([place]) 07/05/2014    Past Surgical History  Procedure Laterality Date  . Left heart catheterization with coronary angiogram N/A 07/05/2014    Procedure: LEFT HEART CATHETERIZATION WITH CORONARY ANGIOGRAM;  Surgeon: Gabriell Casimir M Martinique, MD;  Location: [facility] CATH LAB;  Service: Cardiovascular;  Laterality: N/A;     Current Outpatient Prescriptions  Medication Sig Dispense Refill  . aspirin EC 81 MG tablet Take 1 tablet (81 mg total) by mouth daily. 90 tablet 3  . atorvastatin (LIPITOR) 40 MG tablet Take 1 tablet (40 mg total) by mouth daily. 90 tablet 3  . carvedilol (COREG) 25 MG  tablet Take 1 tablet (25 mg total) by mouth 2 (two) times daily with a meal. 60 tablet 11  . famotidine (PEPCID) 20 MG tablet Take 1 tablet (20 mg total) by mouth 2 (two) times daily. 180 tablet 3  . furosemide (LASIX) 40 MG tablet Take 1 tablet (40 mg total) by mouth daily as needed for edema. 30 tablet 11  . ibuprofen (ADVIL,MOTRIN) 800 MG tablet Take 1 tablet (800 mg total) by mouth 3 (three) times daily. 21 tablet 0  . losartan (COZAAR) 100 MG tablet Take 1 tablet (100 mg total) by mouth daily. 30 tablet 11  . spironolactone (ALDACTONE) 25 MG tablet Take 1 tablet (25 mg total) by mouth daily. 90 tablet 3   No current facility-administered medications for this visit.    Allergies:   Ace inhibitors    Social History:  The patient  reports that he quit smoking about 39 years ago. He has never used smokeless tobacco. He reports that he drinks about 0.6 oz of alcohol per week. He reports that he does not use illicit drugs.   Family History:  The patient's family history is negative for Cancer and Heart disease.    ROS:  Please see the history of present illness.   Otherwise, review of systems are positive for none.   All other systems are reviewed and negative.    PHYSICAL EXAM: VS:  There were no vitals taken for this visit. , BMI There is no weight on file to calculate BMI. Affect appropriate  Healthy:  appears stated age 51: normal Neck supple with no adenopathy JVP normal no bruits no thyromegaly Lungs clear with no wheezing and good diaphragmatic motion Heart:  S1/S2 no murmur, no rub, gallop or click PMI normal Abdomen: benighn, BS positve, no tenderness, no AAA no bruit.  No HSM or HJR Distal pulses intact with no bruits No edema Neuro non-focal Skin warm and dry No muscular weakness    EKG:  01/14/16  SR LVH with strain rate 84    Recent Labs: 01/14/2016: ALT 22; Hemoglobin 14.8; Platelets 152 03/15/2016: BUN 17; Creat 1.13; Potassium 4.5; Sodium 140    Lipid  Panel    Component Value Date/Time   CHOL 183 03/15/2016 1508   TRIG 311* 03/15/2016 1508   HDL 35* 03/15/2016 1508   CHOLHDL 5.2* 03/15/2016 1508   VLDL 62* 03/15/2016 1508   LDLCALC 86 03/15/2016 1508      Wt Readings from Last 3 Encounters:  03/15/16 88.451 kg (195 lb)  01/14/16 88.48 kg (195 lb 1 oz)  07/19/15 87.091 kg (192 lb)      Other studies Reviewed: Additional studies/ records that were reviewed today include: Cath films Dr Martinique and old office notes Dr wall .    ASSESSMENT AND PLAN:  1.  CHF:  Chronic non ischemic DCM BP up today. F/U echo to reassess EF refer to CHF clinic would benefit from [facility] if he can afford it 2. HTN continue current meds low sodium diet 3. Chol:  On statin 4. GERD: low carb diet continue H2 blocker    Current medicines are reviewed at length with the patient today.  The patient does not have concerns regarding medicines.  The following changes have been made:  no change  Labs/ tests ordered today include: Eco and referral to CHF clinic   No orders of the defined types were placed in this encounter.     Disposition:   FU with CHF clinic      Signed, Jenkins Rouge, MD  04/12/2016 6:19 PM    [facility] [address], [place], [place]  [address] Phone: [phone]; Fax: [phone]

## 2016-04-13 ENCOUNTER — Ambulatory Visit (INDEPENDENT_AMBULATORY_CARE_PROVIDER_SITE_OTHER): Payer: Self-pay | Admitting: Cardiovascular Disease

## 2016-04-13 ENCOUNTER — Encounter: Payer: Self-pay | Admitting: Cardiovascular Disease

## 2016-04-13 VITALS — BP 142/110 | HR 79 | Ht 70.0 in | Wt 191.4 lb

## 2016-04-13 DIAGNOSIS — I509 Heart failure, unspecified: Secondary | ICD-10-CM

## 2016-04-13 DIAGNOSIS — Z7189 Other specified counseling: Secondary | ICD-10-CM

## 2016-04-13 DIAGNOSIS — Z7689 Persons encountering health services in other specified circumstances: Secondary | ICD-10-CM

## 2016-04-13 MED FILL — CARVEDILOL 25 MG TABLET: 25 | 30 days supply | Qty: 60 | Fill #0

## 2016-04-13 MED FILL — FUROSEMIDE 40 MG TABLET: 40 | 30 days supply | Qty: 30 | Fill #0

## 2016-04-13 MED FILL — LOSARTAN POTASSIUM 100 MG T: 100 | 30 days supply | Qty: 30 | Fill #0

## 2016-04-13 MED FILL — ?ATORVASTATIN 40MG TABLET: 40 | 30 days supply | Qty: 30 | Fill #0

## 2016-04-13 MED FILL — SPIRONOLACTONE 25 MG TABLET: 25 | 30 days supply | Qty: 30 | Fill #0

## 2016-04-13 MED FILL — FAMOTIDINE 20 MG TABLET: 20 | 30 days supply | Qty: 60 | Fill #0

## 2016-04-13 NOTE — Patient Instructions (Signed)
Medication Instructions:  Same-no changes  Labwork: None  Testing/Procedures: Your physician has requested that you have an echocardiogram. Echocardiography is a painless test that uses sound waves to create images of your heart. It provides your doctor with information about the size and shape of your heart and how well your heart's chambers and valves are working. This procedure takes approximately one hour. There are no restrictions for this procedure.   Follow-Up: First available in the HF clinic.     If you need a refill on your cardiac medications before your next appointment, please call your pharmacy.

## 2016-05-01 ENCOUNTER — Ambulatory Visit (HOSPITAL_COMMUNITY): Payer: Medicaid Other | Attending: Cardiovascular Disease

## 2016-05-09 ENCOUNTER — Telehealth (HOSPITAL_COMMUNITY): Payer: Self-pay | Admitting: Vascular Surgery

## 2016-05-09 NOTE — Telephone Encounter (Signed)
Left pt message to make NP appt end of July
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
## 2016-05-16 ENCOUNTER — Telehealth (HOSPITAL_COMMUNITY): Payer: Self-pay | Admitting: Vascular Surgery

## 2016-05-16 NOTE — Telephone Encounter (Signed)
Left pt message to make NP appt  

## 2016-05-24 ENCOUNTER — Telehealth (HOSPITAL_COMMUNITY): Payer: Self-pay | Admitting: Vascular Surgery

## 2016-05-24 NOTE — Telephone Encounter (Signed)
LEFT 3 RD MESSAGE  TO MAKE PT NP APPT

## 2016-05-30 ENCOUNTER — Encounter (HOSPITAL_COMMUNITY): Payer: Self-pay | Admitting: Vascular Surgery

## 2016-05-30 ENCOUNTER — Telehealth (HOSPITAL_COMMUNITY): Payer: Self-pay | Admitting: Vascular Surgery

## 2016-05-30 NOTE — Telephone Encounter (Signed)
Will send pt letter to make appt

## 2016-06-06 ENCOUNTER — Other Ambulatory Visit: Payer: Self-pay

## 2016-06-06 ENCOUNTER — Ambulatory Visit (HOSPITAL_COMMUNITY): Payer: Medicaid Other | Attending: Cardiovascular Disease

## 2016-06-06 DIAGNOSIS — I110 Hypertensive heart disease with heart failure: Secondary | ICD-10-CM | POA: Insufficient documentation

## 2016-06-06 DIAGNOSIS — I351 Nonrheumatic aortic (valve) insufficiency: Secondary | ICD-10-CM | POA: Diagnosis not present

## 2016-06-06 DIAGNOSIS — I509 Heart failure, unspecified: Secondary | ICD-10-CM | POA: Insufficient documentation

## 2016-06-06 LAB — ECHOCARDIOGRAM COMPLETE
AVPHT: 384 ms
Ao-asc: 38 cm
CHL CUP DOP CALC LVOT VTI: 8.99 cm
E decel time: 201 msec
EERAT: 17.93
FS: 17 % — AB (ref 28–44)
IV/PV OW: 0.95
LA diam end sys: 48 mm
LADIAMINDEX: 2.3 cm/m2
LASIZE: 48 mm
LAVOL: 74 mL
LAVOLA4C: 65 mL
LAVOLIN: 35.5 mL/m2
LDCA: 4.91 cm2
LV TDI E'LATERAL: 3.29
LV e' LATERAL: 3.29 cm/s
LVEEAVG: 17.93
LVEEMED: 17.93
LVOT SV: 44 mL
LVOT diameter: 25 mm
LVOT peak grad rest: 1 mmHg
LVOT peak vel: 48.2 cm/s
MV Dec: 201
MV pk A vel: 53.5 m/s
MVPKEVEL: 59 m/s
PW: 14.6 mm — AB (ref 0.6–1.1)
TDI e' medial: 5.92

## 2016-07-09 MED FILL — ?LOSARTAN POTASSIUM 100 MG: 100 | 30 days supply | Qty: 30 | Fill #1

## 2016-07-09 MED FILL — ?CARVEDILOL 25 MG TABLET: 25 | 30 days supply | Qty: 60 | Fill #1

## 2016-07-09 MED FILL — ?FUROSEMIDE 40 MG TABLET: 40 | 30 days supply | Qty: 30 | Fill #1

## 2016-07-12 ENCOUNTER — Inpatient Hospital Stay (HOSPITAL_COMMUNITY)
Admission: RE | Admit: 2016-07-12 | Discharge: 2016-07-12 | Disposition: A | Discharge status: Home / Self Care | Payer: Medicaid Other | Source: Ambulatory Visit | Attending: Internal Medicine | Admitting: Internal Medicine

## 2016-10-12 MED FILL — CARVEDILOL 25 MG TABLET: 25 | 30 days supply | Qty: 60 | Fill #2

## 2016-10-12 MED FILL — LOSARTAN POTASSIUM 100 MG T: 100 | 30 days supply | Qty: 30 | Fill #2

## 2016-11-15 ENCOUNTER — Encounter (HOSPITAL_COMMUNITY): Payer: Self-pay | Admitting: Internal Medicine

## 2016-11-15 ENCOUNTER — Ambulatory Visit (HOSPITAL_COMMUNITY)
Admission: RE | Admit: 2016-11-15 | Discharge: 2016-11-15 | Disposition: A | Discharge status: Home / Self Care | Payer: Medicaid Other | Source: Ambulatory Visit | Attending: Internal Medicine | Admitting: Internal Medicine

## 2016-11-15 VITALS — BP 138/94 | HR 84 | Resp 18 | Wt 190.8 lb

## 2016-11-15 DIAGNOSIS — I110 Hypertensive heart disease with heart failure: Secondary | ICD-10-CM | POA: Diagnosis not present

## 2016-11-15 DIAGNOSIS — Z79899 Other long term (current) drug therapy: Secondary | ICD-10-CM | POA: Diagnosis not present

## 2016-11-15 DIAGNOSIS — Z7982 Long term (current) use of aspirin: Secondary | ICD-10-CM | POA: Diagnosis not present

## 2016-11-15 DIAGNOSIS — E785 Hyperlipidemia, unspecified: Secondary | ICD-10-CM | POA: Insufficient documentation

## 2016-11-15 DIAGNOSIS — Z87891 Personal history of nicotine dependence: Secondary | ICD-10-CM | POA: Insufficient documentation

## 2016-11-15 DIAGNOSIS — I5022 Chronic systolic (congestive) heart failure: Secondary | ICD-10-CM

## 2016-11-15 DIAGNOSIS — I428 Other cardiomyopathies: Secondary | ICD-10-CM | POA: Diagnosis not present

## 2016-11-15 LAB — COMPREHENSIVE METABOLIC PANEL
ALBUMIN: 3.8 g/dL (ref 3.5–5.0)
ALT: 25 U/L (ref 17–63)
AST: 20 U/L (ref 15–41)
Alkaline Phosphatase: 62 U/L (ref 38–126)
Anion gap: 7 (ref 5–15)
BUN: 25 mg/dL — AB (ref 6–20)
CHLORIDE: 109 mmol/L (ref 101–111)
CO2: 25 mmol/L (ref 22–32)
CREATININE: 1.29 mg/dL — AB (ref 0.61–1.24)
Calcium: 9.3 mg/dL (ref 8.9–10.3)
GFR calc Af Amer: >60 mL/min (ref >60)
GFR calc non Af Amer: 59 mL/min — ABNORMAL LOW (ref >60)
GLUCOSE: 86 mg/dL (ref 65–99)
Potassium: 4.3 mmol/L (ref 3.5–5.1)
SODIUM: 141 mmol/L (ref 135–145)
Total Bilirubin: 0.8 mg/dL (ref 0.3–1.2)
Total Protein: 6.5 g/dL (ref 6.5–8.1)

## 2016-11-15 LAB — BRAIN NATRIURETIC PEPTIDE: B Natriuretic Peptide: 388.7 pg/mL — ABNORMAL HIGH (ref 0.0–100.0)

## 2016-11-15 MED ORDER — SACUBITRIL-VALSARTAN 49-51 MG PO TABS: 1.0000 | ORAL_TABLET | Freq: Two times a day (BID) | ORAL | 3 refills | Status: DC

## 2016-11-15 MED FILL — ENTRESTO 49 MG-51 MG TABLET: 49-51 | 30 days supply | Qty: 60 | Fill #0

## 2016-11-15 NOTE — Progress Notes (Signed)
Patient ID: Joel Beck, male   DOB: 1956-03-14, 60 y.o.   MRN: [id number]     ADVANCED HF CLINIC NOTE   Date:  11/15/2016   ID:  Joel Beck, DOB [birth year], MRN [id number]  PCP:  Joel Ends, MD  Cardiologist:   Dr. Johnsie Cancel    History of Present Illness: Joel Beck is a 60 y.o. male who is referred by Dr. Johnsie Cancel for further evaluation of systolic HF. Previously saw Dr. Verl Blalock as well.  Apparently HF diagnosed in 2015 when he presented  with severe HTN.   Echo 07/05/14  EF 25%  Echo 7/17 EF 20-25%  Cath by Dr Martinique 2015 with no CAD Long standing non ischemic cardiomyopathy.    Here with his wife. Has been in [place] 42 years. Says he hasn't been feeling too well. Gets short of breath with daily activities. No real change. Taking all HF meds. No swelling, orthopnea or PND. Weight stable. Wife says he snores mildly. No syncope. Palpitations. Denies ETOH or drug use. No longer working due to symptoms. Asking many questions about having sex with his wife.      Past Medical History:  Diagnosis Date  . CHF (congestive heart failure) ([place]) 07/05/2014  . Hypertension Dx 2015    Past Surgical History:  Procedure Laterality Date  . LEFT HEART CATHETERIZATION WITH CORONARY ANGIOGRAM N/A 07/05/2014   Procedure: LEFT HEART CATHETERIZATION WITH CORONARY ANGIOGRAM;  Surgeon: Peter M Martinique, MD;  Location: [facility] CATH LAB;  Service: Cardiovascular;  Laterality: N/A;     Current Outpatient Prescriptions  Medication Sig Dispense Refill  . aspirin EC 81 MG tablet Take 1 tablet (81 mg total) by mouth daily. 90 tablet 3  . carvedilol (COREG) 25 MG tablet Take 1 tablet (25 mg total) by mouth 2 (two) times daily with a meal. 60 tablet 11  . furosemide (LASIX) 40 MG tablet Take 1 tablet (40 mg total) by mouth daily as needed for edema. 30 tablet 11  . losartan (COZAAR) 100 MG tablet Take 1 tablet (100 mg total) by mouth daily. 30 tablet 11  . spironolactone (ALDACTONE) 25 MG tablet Take 1 tablet  (25 mg total) by mouth daily. 90 tablet 3   No current facility-administered medications for this encounter.     Allergies:   Ace inhibitors    Social History:  The patient  reports that he quit smoking about 39 years ago. He has never used smokeless tobacco. He reports that he drinks about 0.6 oz of alcohol per week . He reports that he does not use drugs.   Family History:  The patient's family history is not on file.    ROS:  Please see the history of present illness.   Otherwise, review of systems are positive for none.   All other systems are reviewed and negative.    PHYSICAL EXAM: VS:  BP (!) 138/94 (BP Location: Left Arm, Patient Position: Sitting, Cuff Size: Normal)   Pulse 84   Resp 18   Wt 190 lb 12 oz (86.5 kg)   SpO2 100%   BMI 27.37 kg/m  , BMI Body mass index is 27.37 kg/m. Affect appropriate Healthy:  appears stated age 11: normal Neck supple with no adenopathy JVP normal no bruits no thyromegaly Lungs clear with no wheezing and good diaphragmatic motion Heart:  PMI laterally displaceddS1/S2 no murmur, no rub, gallop or click PMI normal Abdomen: benighn, BS positve, no tenderness, no AAA no bruit.  No HSM  or HJR Distal pulses intact with no bruits No edema Neuro non-focal Skin warm and dry No muscular weakness    EKG:  01/14/16  SR LVH with strain rate 84 QRS 106    Recent Labs: 01/14/2016: ALT 22; Hemoglobin 14.8; Platelets 152 03/15/2016: BUN 17; Creat 1.13; Potassium 4.5; Sodium 140    Lipid Panel    Component Value Date/Time   CHOL 183 03/15/2016 1508   TRIG 311 (H) 03/15/2016 1508   HDL 35 (L) 03/15/2016 1508   CHOLHDL 5.2 (H) 03/15/2016 1508   VLDL 62 (H) 03/15/2016 1508   LDLCALC 86 03/15/2016 1508      Wt Readings from Last 3 Encounters:  11/15/16 190 lb 12 oz (86.5 kg)  04/13/16 191 lb 6.4 oz (86.8 kg)  03/15/16 195 lb (88.5 kg)       ASSESSMENT AND PLAN:  1.  Chronic systolic HF: Due to NICM. Suspect HTNive in nature.  Cath without significant CAD 2015. Echo with persistent LV dysfunction EF 20-25% --NYHA III. Volume status mildly elevated (ReDS 39% today) --Will continue carvedilol 25 bid --Switch losartan to Entresto 49/51 bid. Will bring back to PharmD clinic in 3-4 weeks to further titrate --Continue spiro --Take lasix prn --Arrange for cMRI to look for infiltrative process and reassess EF --Long talk about ICD but wants to wait. Understands risk of SCD --Will need CPX in near future if sx not improving  2. HTN  -- Improved  3. Hyperlipidemia -- On statin. followed by Dr. Johnsie Cancel  Total time spent 50 minutes. Over half that time spent discussing above with use of interpreter to clearly discuss issues with hima nd his wife.   Aland Chestnutt,MD 1:40 PM

## 2016-11-15 NOTE — Patient Instructions (Signed)
Start Losartan  Start Entresto 49/51 mg Twice daily   Labs today  Your physician has requested that you have a cardiac MRI. Cardiac MRI uses a computer to create images of your heart as its beating, producing both still and moving pictures of your heart and major blood vessels. For further information please visit [email or web address] Please follow the instruction sheet given to you today for more information.  Your physician recommends that you schedule a follow-up appointment in: [place], Pharm D in 3 weeks  Your physician recommends that you schedule a follow-up appointment in: 6 weeks

## 2016-11-27 ENCOUNTER — Telehealth (HOSPITAL_COMMUNITY): Payer: Self-pay | Admitting: Pharmacist

## 2016-11-27 NOTE — Telephone Encounter (Signed)
Entresto 49-51 mg BID PA approved by  Medicaid through 11/11/17.   Ruta Hinds. Velva Harman, PharmD, BCPS, CPP Clinical Pharmacist Pager: [phone] Phone: [phone] 11/27/2016 3:22 PM

## 2016-12-17 ENCOUNTER — Ambulatory Visit (HOSPITAL_COMMUNITY): Payer: Medicaid Other

## 2016-12-18 ENCOUNTER — Telehealth (HOSPITAL_COMMUNITY): Payer: Self-pay | Admitting: Vascular Surgery

## 2016-12-18 NOTE — Telephone Encounter (Signed)
Left pt message to cancel pharmacy appt 01/02/17 , pt will be seeing DB 01/03/17

## 2017-01-02 ENCOUNTER — Ambulatory Visit (HOSPITAL_COMMUNITY): Payer: Medicaid Other

## 2017-01-03 ENCOUNTER — Encounter (HOSPITAL_COMMUNITY): Payer: Self-pay | Admitting: Internal Medicine

## 2017-01-03 ENCOUNTER — Ambulatory Visit (HOSPITAL_COMMUNITY)
Admission: RE | Admit: 2017-01-03 | Discharge: 2017-01-03 | Disposition: A | Discharge status: Home / Self Care | Payer: Medicaid Other | Source: Ambulatory Visit | Attending: Internal Medicine | Admitting: Internal Medicine

## 2017-01-03 VITALS — BP 138/78 | HR 72 | Resp 20 | Ht 66.0 in | Wt 190.8 lb

## 2017-01-03 DIAGNOSIS — I10 Essential (primary) hypertension: Secondary | ICD-10-CM

## 2017-01-03 DIAGNOSIS — Z87891 Personal history of nicotine dependence: Secondary | ICD-10-CM | POA: Insufficient documentation

## 2017-01-03 DIAGNOSIS — I110 Hypertensive heart disease with heart failure: Secondary | ICD-10-CM | POA: Insufficient documentation

## 2017-01-03 DIAGNOSIS — I429 Cardiomyopathy, unspecified: Secondary | ICD-10-CM | POA: Insufficient documentation

## 2017-01-03 DIAGNOSIS — Z7982 Long term (current) use of aspirin: Secondary | ICD-10-CM | POA: Insufficient documentation

## 2017-01-03 DIAGNOSIS — Z955 Presence of coronary angioplasty implant and graft: Secondary | ICD-10-CM | POA: Diagnosis not present

## 2017-01-03 DIAGNOSIS — I5022 Chronic systolic (congestive) heart failure: Secondary | ICD-10-CM | POA: Diagnosis present

## 2017-01-03 DIAGNOSIS — Z888 Allergy status to other drugs, medicaments and biological substances status: Secondary | ICD-10-CM | POA: Diagnosis not present

## 2017-01-03 DIAGNOSIS — E785 Hyperlipidemia, unspecified: Secondary | ICD-10-CM | POA: Diagnosis not present

## 2017-01-03 MED ORDER — SACUBITRIL-VALSARTAN 97-103 MG PO TABS: 1.0000 | ORAL_TABLET | Freq: Two times a day (BID) | ORAL | 3 refills | Status: DC

## 2017-01-03 NOTE — Addendum Note (Signed)
Encounter addended by: Kennieth Rad, RN on: 01/03/2017 12:06 PM<BR>    Actions taken: Visit diagnoses modified, Order list changed, Diagnosis association updated, Sign clinical note

## 2017-01-03 NOTE — Progress Notes (Signed)
Patient ID: Joel Beck, male   DOB: 05/31/1956, 61 y.o.   MRN: [id number]     ADVANCED HF CLINIC NOTE   Date:  01/03/2017   ID:  Joel Beck, DOB 02-13-1956, MRN [id number]  PCP:  Minerva Ends, MD  Cardiologist:   Dr. Johnsie Cancel    History of Present Illness: Joel Beck is a 61 y.o. male who is referred by Dr. Johnsie Cancel for further evaluation of systolic HF. Previously saw Dr. Verl Blalock as well.  Apparently HF diagnosed in 2015 when he presented  with severe HTN.   Echo 07/05/14  EF 25%  Echo 7/17 EF 20-25%  Cath by Dr Martinique 2015 with no CAD  I saw him for the first time last month. Was volume overloaded (REDs 39%) and hypertensive. Losartan switched to [facility] 49/51. Scheduled cMRI but he changed the appointment. He is feeling better. Breathing better but still with cough. Can do all ADLs without problem but when he tries to hurry he gets SOB. No orthopnea or PND.  Weight stable. Wife says he snores mildly.    Past Medical History:  Diagnosis Date  . CHF (congestive heart failure) ([place]) 07/05/2014  . Hypertension Dx 2015    Past Surgical History:  Procedure Laterality Date  . LEFT HEART CATHETERIZATION WITH CORONARY ANGIOGRAM N/A 07/05/2014   Procedure: LEFT HEART CATHETERIZATION WITH CORONARY ANGIOGRAM;  Surgeon: Peter M Martinique, MD;  Location: [facility] CATH LAB;  Service: Cardiovascular;  Laterality: N/A;     Current Outpatient Prescriptions  Medication Sig Dispense Refill  . aspirin EC 81 MG tablet Take 1 tablet (81 mg total) by mouth daily. 90 tablet 3  . carvedilol (COREG) 25 MG tablet Take 1 tablet (25 mg total) by mouth 2 (two) times daily with a meal. 60 tablet 11  . furosemide (LASIX) 40 MG tablet Take 1 tablet (40 mg total) by mouth daily as needed for edema. 30 tablet 11  . sacubitril-valsartan (ENTRESTO) 49-51 MG Take 1 tablet by mouth 2 (two) times daily. 60 tablet 3  . spironolactone (ALDACTONE) 25 MG tablet Take 1 tablet (25 mg total) by mouth daily. 90 tablet 3   No  current facility-administered medications for this encounter.     Allergies:   Ace inhibitors    Social History:  The patient  reports that he quit smoking about 40 years ago. He has never used smokeless tobacco. He reports that he drinks about 0.6 oz of alcohol per week . He reports that he does not use drugs.   Family History:  The patient's family history is not on file.    ROS:  Please see the history of present illness.   Otherwise, review of systems are positive for none.   All other systems are reviewed and negative.   Filed Weights   01/03/17 1121  Weight: 190 lb 12.8 oz (86.5 kg)    PHYSICAL EXAM: VS:  BP 138/78   Pulse 72   Resp 20   Ht 5\' 6"  (1.676 m)   Wt 190 lb 12.8 oz (86.5 kg)   SpO2 99% Comment: RA  BMI 30.80 kg/m  , BMI Body mass index is 30.8 kg/m. Affect appropriate Healthy:  appears stated age 57: normal Neck supple with no adenopathy JVP normal no bruits no thyromegaly Lungs clear with no wheezing and good diaphragmatic motion Heart:  PMI laterally displaceddS1/S2 no murmur, no rub, gallop or click PMI normal Abdomen: benighn, BS positve, no tenderness, no AAA no bruit.  No HSM or HJR Distal pulses intact with no bruits No edema Neuro non-focal Skin warm and dry No muscular weakness    EKG:  01/14/16  SR LVH with strain rate 84 QRS 106    Recent Labs: 01/14/2016: Hemoglobin 14.8; Platelets 152 11/15/2016: ALT 25; B Natriuretic Peptide 388.7; BUN 25; Creatinine, Ser 1.29; Potassium 4.3; Sodium 141    Lipid Panel    Component Value Date/Time   CHOL 183 03/15/2016 1508   TRIG 311 (H) 03/15/2016 1508   HDL 35 (L) 03/15/2016 1508   CHOLHDL 5.2 (H) 03/15/2016 1508   VLDL 62 (H) 03/15/2016 1508   LDLCALC 86 03/15/2016 1508      Wt Readings from Last 3 Encounters:  01/03/17 190 lb 12.8 oz (86.5 kg)  11/15/16 190 lb 12 oz (86.5 kg)  04/13/16 191 lb 6.4 oz (86.8 kg)       ASSESSMENT AND PLAN:  1.  Chronic systolic HF: Due to  NICM. Suspect HTNive in nature. Cath without significant CAD 2015. Echo with persistent LV dysfunction EF 20-25% --Improving with switch to Entresto. Now NYHA II. Volume status better.  --Increase Entresto to 97/103 bid --Will continue carvedilol 25 bid --Continue spiro --Take lasix prn --Reschedule cMRI to look for infiltrative process and reassess EF in 1 month --Long talk about ICD but wants to wait. Understands risk of SCD --Will need CPX in near future if sx not improving  2. HTN  -- Improved  3. Hyperlipidemia -- On statin. followed by Dr. Eber Jones, Kue Fox,MD 11:53 AM

## 2017-01-03 NOTE — Patient Instructions (Signed)
Increase entresto to 97/103 mg (1 Tablet) Two times daily  Cardiac MRI has been ordered for you.  Follow up in 2 months

## 2017-01-04 ENCOUNTER — Telehealth (HOSPITAL_COMMUNITY): Payer: Self-pay | Admitting: *Deleted

## 2017-01-04 ENCOUNTER — Encounter (HOSPITAL_COMMUNITY): Payer: Self-pay | Admitting: *Deleted

## 2017-01-04 NOTE — Telephone Encounter (Signed)
Message sent to [facility] to schedule CMRI

## 2017-01-16 ENCOUNTER — Encounter: Payer: Self-pay | Admitting: Internal Medicine

## 2017-01-17 MED FILL — CARVEDILOL 25 MG TABLET: 25 | 30 days supply | Qty: 60 | Fill #3

## 2017-01-17 MED FILL — ENTRESTO 97 MG-103 MG TAB: 97-103 | 30 days supply | Qty: 60 | Fill #0

## 2017-01-22 ENCOUNTER — Telehealth (HOSPITAL_COMMUNITY): Payer: Self-pay | Admitting: Pharmacist

## 2017-01-22 NOTE — Telephone Encounter (Signed)
Entresto PA approved by [place] [employer] through 01/11/18.  Ruta Hinds. Velva Harman, PharmD, BCPS, CPP Clinical Pharmacist Pager: [phone] Phone: [phone] 01/22/2017 10:58 AM

## 2017-01-29 ENCOUNTER — Ambulatory Visit (HOSPITAL_COMMUNITY)
Admission: RE | Admit: 2017-01-29 | Discharge: 2017-01-29 | Disposition: A | Discharge status: Home / Self Care | Payer: Medicare Other | Source: Ambulatory Visit | Attending: Internal Medicine | Admitting: Internal Medicine

## 2017-01-29 ENCOUNTER — Encounter (HOSPITAL_COMMUNITY): Payer: Self-pay

## 2017-01-29 DIAGNOSIS — I110 Hypertensive heart disease with heart failure: Secondary | ICD-10-CM | POA: Diagnosis not present

## 2017-01-29 DIAGNOSIS — I5022 Chronic systolic (congestive) heart failure: Secondary | ICD-10-CM | POA: Insufficient documentation

## 2017-01-29 LAB — CREATININE, SERUM: CREATININE: 1.14 mg/dL (ref 0.61–1.24)

## 2017-01-29 NOTE — Progress Notes (Signed)
Pt came to MRI for cardiac exam.  As soon as he was put into the scanner he went into a panic, trying to scoot himself out of the scanner.  Pt is severely claustrophobic and refused the exam.

## 2017-01-30 NOTE — Telephone Encounter (Signed)
Pt went for cMRI yesterday and was to claustrophobic for the machine so it could not be done.  Message sent to Dr [name]

## 2017-02-14 ENCOUNTER — Emergency Department (HOSPITAL_COMMUNITY)
Admission: EM | Admit: 2017-02-14 | Discharge: 2017-02-14 | Disposition: A | Discharge status: Home / Self Care | Payer: Medicare Other | Attending: Emergency Medicine | Admitting: Emergency Medicine

## 2017-02-14 ENCOUNTER — Encounter (HOSPITAL_COMMUNITY): Payer: Self-pay

## 2017-02-14 DIAGNOSIS — I110 Hypertensive heart disease with heart failure: Secondary | ICD-10-CM | POA: Insufficient documentation

## 2017-02-14 DIAGNOSIS — Z7982 Long term (current) use of aspirin: Secondary | ICD-10-CM | POA: Insufficient documentation

## 2017-02-14 DIAGNOSIS — Z87891 Personal history of nicotine dependence: Secondary | ICD-10-CM | POA: Diagnosis not present

## 2017-02-14 DIAGNOSIS — Z79899 Other long term (current) drug therapy: Secondary | ICD-10-CM | POA: Insufficient documentation

## 2017-02-14 DIAGNOSIS — H5712 Ocular pain, left eye: Secondary | ICD-10-CM | POA: Diagnosis present

## 2017-02-14 DIAGNOSIS — I10 Essential (primary) hypertension: Secondary | ICD-10-CM

## 2017-02-14 DIAGNOSIS — I5022 Chronic systolic (congestive) heart failure: Secondary | ICD-10-CM | POA: Insufficient documentation

## 2017-02-14 DIAGNOSIS — H00014 Hordeolum externum left upper eyelid: Secondary | ICD-10-CM | POA: Insufficient documentation

## 2017-02-14 MED ORDER — CLINDAMYCIN HCL 150 MG PO CAPS: 300.0000 mg | ORAL_CAPSULE | Freq: Three times a day (TID) | ORAL | 0 refills | Status: DC

## 2017-02-14 NOTE — ED Triage Notes (Signed)
Pt presents with left eye pain and has ecchymosis to left eye lid. He can not remember if he injured it.

## 2017-02-14 NOTE — ED Provider Notes (Signed)
[place] DEPT Provider Note   CSN: [id number] Arrival date & time: 02/14/17  1159  By signing my name below, I, Higinio Plan, attest that this documentation has been prepared under the direction and in the presence of [facility], PA-C . Electronically Signed: Higinio Plan, Scribe. 02/14/2017. 12:54 PM.  History   Chief Complaint Chief Complaint  Patient presents with  . Eye Pain   The history is provided by the patient. No language interpreter was used.   HPI Comments: Joel Beck is a 61 y.o. male with PMHx of CHF and HTN who presents to the Emergency Department for an evaluation of gradually worsening, bruising surrounding his left eye that began yesterday. Pt reports he awoke from sleep yesterday morning and noticed a small bump to his left upper eyelid and diffuse bruising surrounding his left eye. He states he has applied antibiotic ointment to his left eyelid with no relief. Pt denies any recent injury or trauma to his left eye. No lleft eye pain, sensation of a foreign object in his left eye, headache, visual changes, ear pain, nose pain, and sore throat. He notes he takes Aspirin every day but denies any other anticoagulant use.   Past Medical History:  Diagnosis Date  . CHF (congestive heart failure) ([place]) 07/05/2014  . Hypertension Dx 2015    Patient Active Problem List   Diagnosis Date Noted  . Elevated serum creatinine 07/20/2015  . Hypertensive heart disease with congestive heart failure ([place]) 06/29/2015  . Onychomycosis 09/02/2014  . Chronic systolic CHF (congestive heart failure) ([place]) 07/05/2014  . Accelerated hypertension 07/04/2014  . GERD (gastroesophageal reflux disease) 07/04/2014    Past Surgical History:  Procedure Laterality Date  . LEFT HEART CATHETERIZATION WITH CORONARY ANGIOGRAM N/A 07/05/2014   Procedure: LEFT HEART CATHETERIZATION WITH CORONARY ANGIOGRAM;  Surgeon: Peter M Martinique, MD;  Location: [facility] CATH LAB;  Service: Cardiovascular;  Laterality:  N/A;    Home Medications    Prior to Admission medications   Medication Sig Start Date End Date Taking? Authorizing Provider  aspirin EC 81 MG tablet Take 1 tablet (81 mg total) by mouth daily. 03/15/16   Josalyn Funches, MD  carvedilol (COREG) 25 MG tablet Take 1 tablet (25 mg total) by mouth 2 (two) times daily with a meal. 03/15/16   Josalyn Funches, MD  clindamycin (CLEOCIN) 150 MG capsule Take 2 capsules (300 mg total) by mouth every 8 (eight) hours. 02/14/17   Ozella Almond Cornel Werber, PA-C  furosemide (LASIX) 40 MG tablet Take 1 tablet (40 mg total) by mouth daily as needed for edema. 03/15/16   Josalyn Funches, MD  sacubitril-valsartan (ENTRESTO) 97-103 MG Take 1 tablet by mouth 2 (two) times daily. 01/03/17   Jolaine Artist, MD  spironolactone (ALDACTONE) 25 MG tablet Take 1 tablet (25 mg total) by mouth daily. 03/15/16   [name], MD    Family History Family History  Problem Relation Age of Onset  . Cancer Neg Hx   . Heart disease Neg Hx     Social History Social History  Substance Use Topics  . Smoking status: Former Smoker    Quit date: 12/03/1976  . Smokeless tobacco: Never Used  . Alcohol use 0.6 oz/week    1 Cans of beer per week     Comment: occ   Allergies   Ace inhibitors  Review of Systems Review of Systems  HENT: Negative for ear pain and sore throat.   Eyes: Negative for photophobia, pain, discharge, redness and  visual disturbance.       +bruising surrounding left eye  Neurological: Negative for headaches.   Physical Exam Updated Vital Signs BP (!) 149/101 (BP Location: Left Arm)   Pulse 70   Temp 98.3 F (36.8 C) (Oral)   Resp 18   SpO2 100%   Physical Exam  Constitutional: He is oriented to person, place, and time. He appears well-developed and well-nourished.  HENT:  Head: Normocephalic and atraumatic.  Eyes: EOM are normal. Pupils are equal, round, and reactive to light.  0.5 cm firm non-tender nodule with no overlying erythema. No  drainage. Overlying ecchymosis. No preseptal tenderness.  Neck: Normal range of motion.  Cardiovascular: Normal rate, regular rhythm, normal heart sounds and intact distal pulses.   Pulmonary/Chest: Effort normal and breath sounds normal. No respiratory distress.  Abdominal: Soft. He exhibits no distension. There is no tenderness.  Musculoskeletal: Normal range of motion.  Neurological: He is alert and oriented to person, place, and time.  Skin: Skin is warm and dry.  Psychiatric: He has a normal mood and affect. Judgment normal.  Nursing note and vitals reviewed.  ED Treatments / Results  DIAGNOSTIC STUDIES:  Oxygen Saturation is 100% on RA, normal by my interpretation.    COORDINATION OF CARE:  12:30 PM Discussed treatment plan with pt at bedside and pt agreed to plan.  Labs (all labs ordered are listed, but only abnormal results are displayed) Labs Reviewed - No data to display  EKG  EKG Interpretation None       Radiology No results found.  Procedures Procedures (including critical care time)  Medications Ordered in ED Medications - No data to display  Initial Impression / Assessment and Plan / ED Course  I have reviewed the triage vital signs and the nursing notes.  Pertinent labs & imaging results that were available during my care of the patient were reviewed by me and considered in my medical decision making (see chart for details).    Joel Beck is a 61 y.o. male who presents to ED for a firm mobile nontender nodule to the eyelid c/w hordeolum. No eye pain, swelling, visual changes, redness. No tenderness on exam. Patient does have ecchymosis around the nodule. Tthere is no redness, warmth or tenderness - just bruising overlying the nodule. While considered, exam today is not c/w preseptal cellulitis. Discussed home care instructions including warm compresses with patient. Given location and color change, ABX TO HOLD was given. Patient was instructed to begin  taking antibiotic and seek medical care if any redness, swelling or pain developed. We also discussed returning for visual changes or any new concerning symptoms. Patient wwith elevated blood pressure in the ED today. He is on medication but did not take any today. He has been instructed to take BP meds regularly as directed and follow-up with his PCP for this.   I personally performed the services described in this documentation, which was scribed in my presence. The recorded information has been reviewed and is accurate.   Final Clinical Impressions(s) / ED Diagnoses   Final diagnoses:  Hordeolum externum of left upper eyelid  Essential hypertension    New Prescriptions Discharge Medication List as of 02/14/2017 12:45 PM    START taking these medications   Details  clindamycin (CLEOCIN) 150 MG capsule Take 2 capsules (300 mg total) by mouth every 8 (eight) hours., Starting Thu 02/14/2017, Denning, PA-C 02/14/17 1454  Gwenyth Allegra Tegeler, MD 02/14/17 2100

## 2017-02-14 NOTE — Discharge Instructions (Signed)
Warm compresses to the eye four times daily.  If your eye becomes red, more swollen, or painful, please start antibiotic and seek care. You may return to the ER, see your regular physician or see your eye doctor.  Return to ER for any visual changes, new or worsening symptoms, any additional concerns.

## 2017-03-04 ENCOUNTER — Ambulatory Visit (HOSPITAL_COMMUNITY)
Admission: RE | Admit: 2017-03-04 | Discharge: 2017-03-04 | Disposition: A | Discharge status: Home / Self Care | Payer: Medicare Other | Source: Ambulatory Visit | Attending: Internal Medicine | Admitting: Internal Medicine

## 2017-03-04 ENCOUNTER — Encounter (HOSPITAL_COMMUNITY): Payer: Self-pay | Admitting: Internal Medicine

## 2017-03-04 VITALS — BP 142/92 | HR 76 | Wt 186.8 lb

## 2017-03-04 DIAGNOSIS — Z79899 Other long term (current) drug therapy: Secondary | ICD-10-CM | POA: Insufficient documentation

## 2017-03-04 DIAGNOSIS — Z7982 Long term (current) use of aspirin: Secondary | ICD-10-CM | POA: Diagnosis not present

## 2017-03-04 DIAGNOSIS — I5022 Chronic systolic (congestive) heart failure: Secondary | ICD-10-CM

## 2017-03-04 DIAGNOSIS — I10 Essential (primary) hypertension: Secondary | ICD-10-CM

## 2017-03-04 DIAGNOSIS — I110 Hypertensive heart disease with heart failure: Secondary | ICD-10-CM | POA: Diagnosis not present

## 2017-03-04 DIAGNOSIS — I428 Other cardiomyopathies: Secondary | ICD-10-CM | POA: Insufficient documentation

## 2017-03-04 DIAGNOSIS — Z87891 Personal history of nicotine dependence: Secondary | ICD-10-CM | POA: Insufficient documentation

## 2017-03-04 LAB — BASIC METABOLIC PANEL
Anion gap: 7 (ref 5–15)
BUN: 21 mg/dL — ABNORMAL HIGH (ref 6–20)
CO2: 24 mmol/L (ref 22–32)
CREATININE: 1.32 mg/dL — AB (ref 0.61–1.24)
Calcium: 9.2 mg/dL (ref 8.9–10.3)
Chloride: 109 mmol/L (ref 101–111)
GFR calc Af Amer: >60 mL/min (ref >60)
GFR calc non Af Amer: 57 mL/min — ABNORMAL LOW (ref >60)
GLUCOSE: 82 mg/dL (ref 65–99)
Potassium: 4.5 mmol/L (ref 3.5–5.1)
Sodium: 140 mmol/L (ref 135–145)

## 2017-03-04 MED ORDER — AMLODIPINE BESYLATE 5 MG PO TABS: 5.0000 mg | ORAL_TABLET | Freq: Every day | ORAL | 6 refills | Status: DC

## 2017-03-04 MED FILL — AMLODIPINE BESYLATE 5 MG TA: 5 | 30 days supply | Qty: 30 | Fill #0

## 2017-03-04 NOTE — Patient Instructions (Signed)
Start Amlodipine 5 mg daily  Labs today  Your physician has requested that you have an echocardiogram. Echocardiography is a painless test that uses sound waves to create images of your heart. It provides your doctor with information about the size and shape of your heart and how well your heart's chambers and valves are working. This procedure takes approximately one hour. There are no restrictions for this procedure.  Your physician recommends that you schedule a follow-up appointment in: 3 months

## 2017-03-04 NOTE — Addendum Note (Signed)
Encounter addended by: Scarlette Calico, RN on: 03/04/2017 12:14 PM<BR>    Actions taken: Order list changed, Diagnosis association updated, Sign clinical note

## 2017-03-04 NOTE — Progress Notes (Signed)
Patient ID: Joel Beck, male   DOB: May 10, 1956, 61 y.o.   MRN: [id number]   Advanced Heart Failure Clinic Note    Date:  03/04/2017   ID:  Joel Beck, DOB 01-22-1956, MRN [id number]  PCP:  Minerva Ends, MD  Cardiologist:   Dr. Johnsie Cancel   History of Present Illness: Joel Beck is a 61 y.o. male who is referred by Dr. Johnsie Cancel for further evaluation of systolic HF. Previously saw Dr. Verl Blalock as well.  Apparently HF diagnosed in 2015 when he presented  with severe HTN.   Echo 07/05/14  EF 25%  Echo 7/17 EF 20-25%  Cath by Dr Martinique 2015 with no CAD  He presents today for regular follow up. At last visit Entresto increased to 97/103 mg BID. Has not gotten cMRI, he showed up but was too claustrophobic.  Denies DOE, can walk on flat ground. Mild SOB going up steps or getting in a hurry. No CP or edema. Denies lightheadedness or dizziness.  States has occasional burning in chest that is relieved with water.  Weight at home stable. Down 4 lbs from last clinic visit. Taking all medications as directed. Walking regularly.    Past Medical History:  Diagnosis Date  . CHF (congestive heart failure) ([place]) 07/05/2014  . Hypertension Dx 2015    Past Surgical History:  Procedure Laterality Date  . LEFT HEART CATHETERIZATION WITH CORONARY ANGIOGRAM N/A 07/05/2014   Procedure: LEFT HEART CATHETERIZATION WITH CORONARY ANGIOGRAM;  Surgeon: Peter M Martinique, MD;  Location: [facility] CATH LAB;  Service: Cardiovascular;  Laterality: N/A;     Current Outpatient Prescriptions  Medication Sig Dispense Refill  . aspirin EC 81 MG tablet Take 1 tablet (81 mg total) by mouth daily. 90 tablet 3  . carvedilol (COREG) 25 MG tablet Take 1 tablet (25 mg total) by mouth 2 (two) times daily with a meal. 60 tablet 11  . clindamycin (CLEOCIN) 150 MG capsule Take 2 capsules (300 mg total) by mouth every 8 (eight) hours. 42 capsule 0  . furosemide (LASIX) 40 MG tablet Take 1 tablet (40 mg total) by mouth daily as needed for  edema. 30 tablet 11  . sacubitril-valsartan (ENTRESTO) 97-103 MG Take 1 tablet by mouth 2 (two) times daily. 60 tablet 3  . spironolactone (ALDACTONE) 25 MG tablet Take 1 tablet (25 mg total) by mouth daily. 90 tablet 3   No current facility-administered medications for this encounter.     Allergies:   Ace inhibitors    Social History:  The patient  reports that he quit smoking about 40 years ago. He has never used smokeless tobacco. He reports that he drinks about 0.6 oz of alcohol per week . He reports that he does not use drugs.   Family History:  Father had high blood pressure and diabetes. Mother had no cardiac problems. No other pertinent family history.   Review of systems complete and found to be negative unless listed in HPI.    PHYSICAL EXAM: VS:  BP (!) 142/92   Pulse 76   Wt 186 lb 12.8 oz (84.7 kg)   SpO2 99%   BMI 30.15 kg/m  , BMI Body mass index is 30.15 kg/m. General: Pleasant, NAD. No resp difficulty Psych: Normal affect. HEENT: Normal, without mass or lesion.Anicteric  Neck: Supple, no bruits or JVD. Carotids 2+. No lymphadenopathy/thyromegaly appreciated. Heart: PMI nondisplaced. RRR no s3, s4, or murmurs. Lungs: Resp regular and unlabored, CTA. No wheeze  Abdomen: Soft, non-tender,  non-distended, No HSM, BS + x 4.  Extremities: Warm. No clubbing, cyanosis or edema. DP/PT/Radials 2+ and equal bilaterally. Neuro: Alert and oriented X 3. Moves all extremities spontaneously.   Wt Readings from Last 3 Encounters:  03/04/17 186 lb 12.8 oz (84.7 kg)  01/03/17 190 lb 12.8 oz (86.5 kg)  11/15/16 190 lb 12 oz (86.5 kg)     EKG:  01/14/16  SR LVH with strain rate 84 QRS 106    Recent Labs: 11/15/2016: ALT 25; B Natriuretic Peptide 388.7; BUN 25; Potassium 4.3; Sodium 141 01/29/2017: Creatinine, Ser 1.14    Lipid Panel    Component Value Date/Time   CHOL 183 03/15/2016 1508   TRIG 311 (H) 03/15/2016 1508   HDL 35 (L) 03/15/2016 1508   CHOLHDL 5.2  (H) 03/15/2016 1508   VLDL 62 (H) 03/15/2016 1508   LDLCALC 86 03/15/2016 1508    ASSESSMENT AND PLAN:  1.  Chronic systolic HF: Due to NICM. Suspect HTNive in nature. Cath without significant CAD 2015. Echo 12/2015 with persistent LV dysfunction EF 20-25% - NYHA II. Volume status looks great on exam.   - Continue Entresto 97/103 mg BID.  - Continue coreg 25 mg BID.  - Continue spiro 25 mg daily.  --Take lasix prn - Pt intolerant to cMRI with claustrophobia.  - Will need Echo  - If EF remains depressed, will again need to address ICD. Pt so far has refused. Understands risk of SCD. - Sx stable. Can consider CPX eventually.  - Reinforced fluid restriction to < 2 L daily, sodium restriction to less than 2000 mg daily, and the importance of daily weights.   2. HTN  - Remains mildly elevated.  - Will add 5 mg of amlodipine 3. Hyperlipidemia - Continue statin. Per Dr. Johnsie Cancel.    Joel Friar, PA-C  11:23 AM   Patient seen and examined with the above-signed Advanced Practice Provider and/or Housestaff. I personally reviewed laboratory data, imaging studies and relevant notes. I independently examined the patient and formulated the important aspects of the plan. I have edited the note to reflect any of my changes or salient points. I have personally discussed the plan with the patient and/or family.  Looks very good today. NYHA II. Volume status looks good. BP better controlled but still not at goal. Add amlodipine. Repeat echo. See back in 3 months. If EF still <= 35% will need to rediscuss ICD.   Joel Bickers, MD  12:10 PM

## 2017-03-18 ENCOUNTER — Other Ambulatory Visit (HOSPITAL_COMMUNITY): Payer: Medicaid Other

## 2017-04-17 ENCOUNTER — Encounter: Payer: Self-pay | Admitting: Family Medicine

## 2017-05-07 MED FILL — ?AMLODIPINE BESYLATE 5 MG T: 5 | 30 days supply | Qty: 30 | Fill #1

## 2017-05-29 ENCOUNTER — Ambulatory Visit (HOSPITAL_BASED_OUTPATIENT_CLINIC_OR_DEPARTMENT_OTHER)
Admission: RE | Admit: 2017-05-29 | Discharge: 2017-05-29 | Disposition: A | Discharge status: Home / Self Care | Payer: Medicare Other | Source: Ambulatory Visit

## 2017-05-29 ENCOUNTER — Encounter (HOSPITAL_COMMUNITY): Payer: Self-pay | Admitting: Internal Medicine

## 2017-05-29 ENCOUNTER — Ambulatory Visit (HOSPITAL_COMMUNITY)
Admission: RE | Admit: 2017-05-29 | Discharge: 2017-05-29 | Disposition: A | Discharge status: Home / Self Care | Payer: Medicare Other | Source: Ambulatory Visit | Attending: Internal Medicine | Admitting: Internal Medicine

## 2017-05-29 VITALS — BP 162/98 | HR 76 | Wt 191.8 lb

## 2017-05-29 DIAGNOSIS — Z7982 Long term (current) use of aspirin: Secondary | ICD-10-CM | POA: Diagnosis not present

## 2017-05-29 DIAGNOSIS — I110 Hypertensive heart disease with heart failure: Secondary | ICD-10-CM | POA: Insufficient documentation

## 2017-05-29 DIAGNOSIS — E785 Hyperlipidemia, unspecified: Secondary | ICD-10-CM | POA: Insufficient documentation

## 2017-05-29 DIAGNOSIS — I5022 Chronic systolic (congestive) heart failure: Secondary | ICD-10-CM | POA: Diagnosis not present

## 2017-05-29 DIAGNOSIS — Z87891 Personal history of nicotine dependence: Secondary | ICD-10-CM | POA: Insufficient documentation

## 2017-05-29 DIAGNOSIS — I083 Combined rheumatic disorders of mitral, aortic and tricuspid valves: Secondary | ICD-10-CM | POA: Diagnosis not present

## 2017-05-29 DIAGNOSIS — I10 Essential (primary) hypertension: Secondary | ICD-10-CM

## 2017-05-29 DIAGNOSIS — Z833 Family history of diabetes mellitus: Secondary | ICD-10-CM | POA: Insufficient documentation

## 2017-05-29 DIAGNOSIS — I371 Nonrheumatic pulmonary valve insufficiency: Secondary | ICD-10-CM | POA: Insufficient documentation

## 2017-05-29 MED ORDER — SACUBITRIL-VALSARTAN 97-103 MG PO TABS: 1.0000 | ORAL_TABLET | Freq: Two times a day (BID) | ORAL | 3 refills | Status: DC

## 2017-05-29 MED ORDER — AMLODIPINE BESYLATE 10 MG PO TABS: 10.0000 mg | ORAL_TABLET | Freq: Every day | ORAL | 6 refills | Status: DC

## 2017-05-29 MED FILL — ?AMLODIPINE BESYLATE 10 MG: 10 | 30 days supply | Qty: 30 | Fill #0

## 2017-05-29 MED FILL — ENTRESTO 97 MG-103 MG TAB: 97-103 | 30 days supply | Qty: 60 | Fill #0

## 2017-05-29 NOTE — Progress Notes (Signed)
Patient ID: Joel Beck, male   DOB: 09/18/1956, 61 y.o.   MRN: [id number]   Advanced Heart Failure Clinic Note    Date:  05/29/2017   ID:  Joel Beck, DOB 04-12-1956, MRN [id number]  PCP:  [name], MD  Cardiologist:   Dr. Johnsie Cancel   History of Present Illness: Joel Beck is a 61 y.o. male who is referred by Dr. Johnsie Cancel for further evaluation of systolic HF. Previously saw Dr. Verl Blalock as well.  Apparently HF diagnosed in 2015 when he presented  with severe HTN.   Echo 07/05/14  EF 25%  Echo 7/17 EF 20-25%  Cath by Dr Martinique 2015 with no CAD  Unable to tolerate cMRI due to claustrophobia.   He presents today for regular follow up. Doing well. Denies SOB with walking on flat ground. But does get dyspneic on steps. Weight stable around 190. No CP or edema. BP still high. No orthopnea or PND. Compliant with meds.   Echo today reviewed personally EF 15% RV ok.   Past Medical History:  Diagnosis Date  . CHF (congestive heart failure) ([place]) 07/05/2014  . Hypertension Dx 2015    Past Surgical History:  Procedure Laterality Date  . LEFT HEART CATHETERIZATION WITH CORONARY ANGIOGRAM N/A 07/05/2014   Procedure: LEFT HEART CATHETERIZATION WITH CORONARY ANGIOGRAM;  Surgeon: Peter M Martinique, MD;  Location: [facility] CATH LAB;  Service: Cardiovascular;  Laterality: N/A;     Current Outpatient Prescriptions  Medication Sig Dispense Refill  . amLODipine (NORVASC) 5 MG tablet Take 1 tablet (5 mg total) by mouth daily. 30 tablet 6  . aspirin EC 81 MG tablet Take 1 tablet (81 mg total) by mouth daily. 90 tablet 3  . carvedilol (COREG) 25 MG tablet Take 1 tablet (25 mg total) by mouth 2 (two) times daily with a meal. 60 tablet 11  . sacubitril-valsartan (ENTRESTO) 97-103 MG Take 1 tablet by mouth 2 (two) times daily. 60 tablet 3  . spironolactone (ALDACTONE) 25 MG tablet Take 1 tablet (25 mg total) by mouth daily. 90 tablet 3  . furosemide (LASIX) 40 MG tablet Take 1 tablet (40 mg total) by mouth  daily as needed for edema. (Patient not taking: Reported on 05/29/2017) 30 tablet 11   No current facility-administered medications for this encounter.     Allergies:   Ace inhibitors    Social History:  The patient  reports that he quit smoking about 40 years ago. He has never used smokeless tobacco. He reports that he drinks about 0.6 oz of alcohol per week . He reports that he does not use drugs.   Family History:  Father had high blood pressure and diabetes. Mother had no cardiac problems. No other pertinent family history.   Review of systems complete and found to be negative unless listed in HPI.    PHYSICAL EXAM: VS:  BP (!) 162/98   Pulse 76   Wt 191 lb 12 oz (87 kg)   SpO2 100%   BMI 30.95 kg/m  , BMI Body mass index is 30.95 kg/m. General:  Well appearing. No resp difficulty HEENT: normal Neck: supple. no JVD. Carotids 2+ bilat; no bruits. No lymphadenopathy or thryomegaly appreciated. Cor: PMI laterally displaced. Regular rate & rhythm. No rubs, gallops or murmurs. Lungs: clear Abdomen: soft, nontender, nondistended. No hepatosplenomegaly. No bruits or masses. Good bowel sounds. Extremities: no cyanosis, clubbing, rash, edema Neuro: alert & orientedx3, cranial nerves grossly intact. moves all 4 extremities w/o difficulty. Affect  pleasant   Wt Readings from Last 3 Encounters:  05/29/17 191 lb 12 oz (87 kg)  03/04/17 186 lb 12.8 oz (84.7 kg)  01/03/17 190 lb 12.8 oz (86.5 kg)     EKG:  01/14/16  SR LVH with strain rate 84 QRS 106    Recent Labs: 11/15/2016: ALT 25; B Natriuretic Peptide 388.7 03/04/2017: BUN 21; Creatinine, Ser 1.32; Potassium 4.5; Sodium 140    Lipid Panel    Component Value Date/Time   CHOL 183 03/15/2016 1508   TRIG 311 (H) 03/15/2016 1508   HDL 35 (L) 03/15/2016 1508   CHOLHDL 5.2 (H) 03/15/2016 1508   VLDL 62 (H) 03/15/2016 1508   LDLCALC 86 03/15/2016 1508    ASSESSMENT AND PLAN:  1.  Chronic systolic HF: Due to NICM. Suspect  HTNive in nature. Cath without significant CAD 2015. Echo 12/2015 with persistent LV dysfunction EF 20-25% - Echo reviewed today personally and EF still 15-20%. RV ok - NYHA II. Volume status looks good  - Only taking Entresto 49/51 will increase to 97/103 mg BID.  - Continue coreg 25 mg BID.  - Continue spiro 25 mg daily.  --Take lasix prn - Pt intolerant to cMRI with claustrophobia.  - Long talk about possibility that LV will not recover and may need advanced therapies. Will proceed with CPX.  - We discussed ICD but he refuses. Wants to see if EF will improve with meds first.  - Reinforced fluid restriction to < 2 L daily, sodium restriction to less than 2000 mg daily, and the importance of daily weights.   2. HTN  - Remains elevated.  - Increase amlodipine to 10. - Increase Entresto to 97/103 bid 3. Hyperlipidemia - Continue statin. Per Dr. Johnsie Cancel.   F/u in several weeks with PharmD to adjust BP meds. May need hydralazine - would start with bid dosing due to compliance issues.   Glori Bickers, MD  3:26 PM

## 2017-05-29 NOTE — Progress Notes (Signed)
  Echocardiogram 2D Echocardiogram has been performed.  Joel Beck M 05/29/2017, 2:48 PM

## 2017-05-29 NOTE — Patient Instructions (Signed)
Increase Amlodipine 10 mg daily  Increase Entresto to 97/103 mg Twice daily (can take 2 of the 49/51 mg tabs to equal 97/103 mg)  Your physician has recommended that you have a cardiopulmonary stress test (CPX). CPX testing is a non-invasive measurement of heart and lung function. It replaces a traditional treadmill stress test. This type of test provides a tremendous amount of information that relates not only to your present condition but also for future outcomes. This test combines measurements of you ventilation, respiratory gas exchange in the lungs, electrocardiogram (EKG), blood pressure and physical response before, during, and following an exercise protocol.  Your physician recommends that you schedule a follow-up appointment in: 3-4 weeks with Joel Beck, Pharm D  We will contact you in 3 months to schedule your next appointment.

## 2017-06-19 ENCOUNTER — Ambulatory Visit (HOSPITAL_COMMUNITY): Payer: Medicare Other

## 2017-06-20 ENCOUNTER — Ambulatory Visit (HOSPITAL_COMMUNITY): Payer: Medicare Other | Attending: Cardiology

## 2017-06-20 DIAGNOSIS — I5022 Chronic systolic (congestive) heart failure: Secondary | ICD-10-CM | POA: Diagnosis not present

## 2017-06-21 DIAGNOSIS — I5022 Chronic systolic (congestive) heart failure: Secondary | ICD-10-CM | POA: Diagnosis not present

## 2017-07-10 ENCOUNTER — Ambulatory Visit (HOSPITAL_COMMUNITY): Payer: Medicare Other

## 2017-08-12 MED FILL — AMLODIPINE BESYLATE 10 MG T: 10 | 30 days supply | Qty: 30 | Fill #1

## 2017-08-29 ENCOUNTER — Ambulatory Visit (HOSPITAL_COMMUNITY)
Admission: RE | Admit: 2017-08-29 | Discharge: 2017-08-29 | Disposition: A | Discharge status: Home / Self Care | Payer: Medicare Other | Source: Ambulatory Visit | Attending: Internal Medicine | Admitting: Internal Medicine

## 2017-08-29 ENCOUNTER — Encounter (HOSPITAL_COMMUNITY): Payer: Self-pay

## 2017-08-29 VITALS — BP 112/72 | HR 83 | Wt 187.6 lb

## 2017-08-29 DIAGNOSIS — Z7982 Long term (current) use of aspirin: Secondary | ICD-10-CM | POA: Diagnosis not present

## 2017-08-29 DIAGNOSIS — Z87891 Personal history of nicotine dependence: Secondary | ICD-10-CM | POA: Insufficient documentation

## 2017-08-29 DIAGNOSIS — I5022 Chronic systolic (congestive) heart failure: Secondary | ICD-10-CM | POA: Insufficient documentation

## 2017-08-29 DIAGNOSIS — I110 Hypertensive heart disease with heart failure: Secondary | ICD-10-CM | POA: Insufficient documentation

## 2017-08-29 DIAGNOSIS — I428 Other cardiomyopathies: Secondary | ICD-10-CM

## 2017-08-29 DIAGNOSIS — I10 Essential (primary) hypertension: Secondary | ICD-10-CM | POA: Diagnosis not present

## 2017-08-29 DIAGNOSIS — I429 Cardiomyopathy, unspecified: Secondary | ICD-10-CM | POA: Diagnosis not present

## 2017-08-29 DIAGNOSIS — Z833 Family history of diabetes mellitus: Secondary | ICD-10-CM | POA: Insufficient documentation

## 2017-08-29 LAB — BASIC METABOLIC PANEL
ANION GAP: 9 (ref 5–15)
BUN: 20 mg/dL (ref 6–20)
CO2: 24 mmol/L (ref 22–32)
CREATININE: 1.12 mg/dL (ref 0.61–1.24)
Calcium: 9.3 mg/dL (ref 8.9–10.3)
Chloride: 103 mmol/L (ref 101–111)
Glucose, Bld: 90 mg/dL (ref 65–99)
Potassium: 4.1 mmol/L (ref 3.5–5.1)
Sodium: 136 mmol/L (ref 135–145)

## 2017-08-29 LAB — BRAIN NATRIURETIC PEPTIDE: B Natriuretic Peptide: 90.3 pg/mL (ref 0.0–100.0)

## 2017-08-29 NOTE — Addendum Note (Signed)
Encounter addended by: Louann Liv, LCSW on: 08/29/2017  3:58 PM<BR>    Actions taken: Sign clinical note

## 2017-08-29 NOTE — Progress Notes (Signed)
Patient ID: Joel Beck, male   DOB: Sep 21, 1956, 61 y.o.   MRN: [id number]   Advanced Heart Failure Clinic Note    Date:  08/29/2017   ID:  Joel Beck, DOB 12/18/1955, MRN [id number]  PCP:  [name], MD  Cardiologist:   Dr. Johnsie Cancel   History of Present Illness: Joel Beck is a 61 y.o. male with NICM, HTN,  and chronic systolic heart failure diagnosed in 2015.    Today he returns for HF follow up. Overall feeling fine. Denies PND/Orthopnea. Does admit to mild dyspnea with steps. Appetite ok. No fever or chills. Weight at home 170 pounds. Taking all medications  Echo 07/05/14  EF 25%  Echo 7/17 EF 20-25%  ECHO 05/2017: EF 20-25% Grade II DD Cath by Dr Martinique 2015 with no CAD  Unable to tolerate cMRI due to claustrophobia.   CPX Test 06/2017 Peak VO2: 20.6 (74% predicted peak VO2) VE/VCO2 slope: 28 OUES: 2.55 Peak RER: 0.96 Submax test. Mildly reduced functional capacity. Mild chrontropic incompetence in setting of submax effort. No obvious HF or pulmonary limitation noted.    Past Medical History:  Diagnosis Date  . CHF (congestive heart failure) ([place]) 07/05/2014  . Hypertension Dx 2015    Past Surgical History:  Procedure Laterality Date  . LEFT HEART CATHETERIZATION WITH CORONARY ANGIOGRAM N/A 07/05/2014   Procedure: LEFT HEART CATHETERIZATION WITH CORONARY ANGIOGRAM;  Surgeon: Peter M Martinique, MD;  Location: [facility] CATH LAB;  Service: Cardiovascular;  Laterality: N/A;     Current Outpatient Prescriptions  Medication Sig Dispense Refill  . amLODipine (NORVASC) 10 MG tablet Take 1 tablet (10 mg total) by mouth daily. 30 tablet 6  . sacubitril-valsartan (ENTRESTO) 97-103 MG Take 1 tablet by mouth 2 (two) times daily. 60 tablet 3  . aspirin EC 81 MG tablet Take 1 tablet (81 mg total) by mouth daily. (Patient not taking: Reported on 08/29/2017) 90 tablet 3  . carvedilol (COREG) 25 MG tablet Take 1 tablet (25 mg total) by mouth 2 (two) times daily with a meal. (Patient not  taking: Reported on 08/29/2017) 60 tablet 11  . furosemide (LASIX) 40 MG tablet Take 1 tablet (40 mg total) by mouth daily as needed for edema. (Patient not taking: Reported on 05/29/2017) 30 tablet 11  . spironolactone (ALDACTONE) 25 MG tablet Take 1 tablet (25 mg total) by mouth daily. (Patient not taking: Reported on 08/29/2017) 90 tablet 3   No current facility-administered medications for this encounter.     Allergies:   Ace inhibitors    Social History:  The patient  reports that he quit smoking about 40 years ago. He has never used smokeless tobacco. He reports that he drinks about 0.6 oz of alcohol per week . He reports that he does not use drugs.   Family History:  Father had high blood pressure and diabetes. Mother had no cardiac problems. No other pertinent family history.   Review of systems complete and found to be negative unless listed in HPI.    PHYSICAL EXAM: VS:  BP 112/72   Pulse 83   Wt 187 lb 9.6 oz (85.1 kg)   SpO2 98%   BMI 30.28 kg/m  , BMI Body mass index is 30.28 kg/m.  Filed Weights   08/29/17 1417  Weight: 187 lb 9.6 oz (85.1 kg)   General:  Well appearing. No resp difficulty HEENT: normal Neck: supple. no JVD. Carotids 2+ bilat; no bruits. No lymphadenopathy or thryomegaly appreciated. Cor: PMI  nondisplaced. Regular rate & rhythm. No rubs, gallops or murmurs. Lungs: clear Abdomen: soft, nontender, nondistended. No hepatosplenomegaly. No bruits or masses. Good bowel sounds. Extremities: no cyanosis, clubbing, rash, edema Neuro: alert & orientedx3, cranial nerves grossly intact. moves all 4 extremities w/o difficulty. Affect pleasant    Wt Readings from Last 3 Encounters:  08/29/17 187 lb 9.6 oz (85.1 kg)  05/29/17 191 lb 12 oz (87 kg)  03/04/17 186 lb 12.8 oz (84.7 kg)     EKG:  01/14/16  SR LVH with strain rate 84 QRS 106    Recent Labs: 11/15/2016: ALT 25; B Natriuretic Peptide 388.7 03/04/2017: BUN 21; Creatinine, Ser 1.32; Potassium 4.5;  Sodium 140    Lipid Panel    Component Value Date/Time   CHOL 183 03/15/2016 1508   TRIG 311 (H) 03/15/2016 1508   HDL 35 (L) 03/15/2016 1508   CHOLHDL 5.2 (H) 03/15/2016 1508   VLDL 62 (H) 03/15/2016 1508   LDLCALC 86 03/15/2016 1508    ASSESSMENT AND PLAN:  1.  Chronic systolic HF: Due to NICM. Suspect HTNin nature. Cath without significant CAD 2015. EF has been down since 2015. Now on guide line directed therapy.  NYHA II. Volume status stable. Does not need lasix.  -On goal dose coreg and entresto.  Continue spiro 25 mg daily.  Check BMET today.   - Pt intolerant to cMRI with claustrophobia.  CPX test suboptimal effort.  Discussed ICD with our Spanish Speaking pharmacist interpreting. Marland Kitchen He is agreeable. Refer to EP today.  2. HTN  Improved. Continue current regimen.   Follow up in 3 months. BMET today.   Greater than 50% of the (total minutes 25 ) visit spent in counseling/coordination of care regarding CPX testing,  EP referral,  and low EF despite optimal medical management.   Darrick Grinder, NP  2:22 PM

## 2017-08-29 NOTE — Progress Notes (Signed)
CSW referred to assist with dental resources. Patient thinks he has medicaid although does not have a crad. CSW looked up medicaid in his medical record and provided number although unclear if still active. CSW provided patient with list of options and suggested returning to his PCP [facility] for additional referrals. Patient verbalizes understanding and will follow up with resources provided. CSW available as needed. Raquel Sarna, [place], [place]

## 2017-08-29 NOTE — Patient Instructions (Signed)
Routine lab work today. Will notify you of abnormal results, otherwise no news is good news!  Will refer you to electrophysiology at [facility]. Address: [address] ([address]), [place], [place] [address]  Phone: [phone] Office will call you to schedule over the phone.  Follow up 3 months.  _________________________________________________________________ Joel Beck Code: 8002  Take all medication as prescribed the day of your appointment. Bring all medications with you to your appointment.  Do the following things EVERYDAY: 1) Weigh yourself in the morning before breakfast. Write it down and keep it in a log. 2) Take your medicines as prescribed 3) Eat low salt foods-Limit salt (sodium) to 2000 mg per day.  4) Stay as active as you can everyday 5) Limit all fluids for the day to less than 2 liters

## 2017-09-13 ENCOUNTER — Ambulatory Visit (INDEPENDENT_AMBULATORY_CARE_PROVIDER_SITE_OTHER): Payer: Medicare Other | Admitting: Internal Medicine

## 2017-09-13 DIAGNOSIS — I5022 Chronic systolic (congestive) heart failure: Secondary | ICD-10-CM

## 2017-09-13 NOTE — Progress Notes (Signed)
No show

## 2017-09-16 ENCOUNTER — Encounter: Payer: Self-pay | Admitting: Internal Medicine

## 2017-09-16 NOTE — Addendum Note (Signed)
Addended by: Jordan Likes on: 09/16/2017 12:54 PM   Modules accepted: Orders

## 2017-10-07 MED FILL — AMLODIPINE BESYLATE 10 MG T: 10 | 30 days supply | Qty: 30 | Fill #2

## 2017-10-07 MED FILL — ENTRESTO 97 MG-103 MG TAB: 97-103 | 30 days supply | Qty: 60 | Fill #1

## 2017-11-14 ENCOUNTER — Ambulatory Visit (HOSPITAL_COMMUNITY)
Admission: RE | Admit: 2017-11-14 | Discharge: 2017-11-14 | Disposition: A | Discharge status: Home / Self Care | Payer: Medicare Other | Source: Ambulatory Visit | Attending: Cardiology | Admitting: Cardiology

## 2017-11-14 VITALS — BP 156/108 | HR 80 | Wt 194.0 lb

## 2017-11-14 DIAGNOSIS — Z8249 Family history of ischemic heart disease and other diseases of the circulatory system: Secondary | ICD-10-CM | POA: Insufficient documentation

## 2017-11-14 DIAGNOSIS — Z955 Presence of coronary angioplasty implant and graft: Secondary | ICD-10-CM | POA: Diagnosis not present

## 2017-11-14 DIAGNOSIS — I110 Hypertensive heart disease with heart failure: Secondary | ICD-10-CM

## 2017-11-14 DIAGNOSIS — I5022 Chronic systolic (congestive) heart failure: Secondary | ICD-10-CM

## 2017-11-14 DIAGNOSIS — I429 Cardiomyopathy, unspecified: Secondary | ICD-10-CM | POA: Diagnosis not present

## 2017-11-14 DIAGNOSIS — Z833 Family history of diabetes mellitus: Secondary | ICD-10-CM | POA: Diagnosis not present

## 2017-11-14 DIAGNOSIS — Z79899 Other long term (current) drug therapy: Secondary | ICD-10-CM | POA: Insufficient documentation

## 2017-11-14 DIAGNOSIS — I10 Essential (primary) hypertension: Secondary | ICD-10-CM

## 2017-11-14 DIAGNOSIS — Z7982 Long term (current) use of aspirin: Secondary | ICD-10-CM | POA: Insufficient documentation

## 2017-11-14 DIAGNOSIS — Z87891 Personal history of nicotine dependence: Secondary | ICD-10-CM | POA: Insufficient documentation

## 2017-11-14 LAB — BASIC METABOLIC PANEL
Anion gap: 7 (ref 5–15)
BUN: 21 mg/dL — AB (ref 6–20)
CO2: 27 mmol/L (ref 22–32)
CREATININE: 1.17 mg/dL (ref 0.61–1.24)
Calcium: 9.2 mg/dL (ref 8.9–10.3)
Chloride: 105 mmol/L (ref 101–111)
Glucose, Bld: 87 mg/dL (ref 65–99)
Potassium: 4.3 mmol/L (ref 3.5–5.1)
SODIUM: 139 mmol/L (ref 135–145)

## 2017-11-14 MED ORDER — CARVEDILOL 3.125 MG PO TABS: 3.1250 mg | ORAL_TABLET | Freq: Two times a day (BID) | ORAL | 6 refills | Status: DC

## 2017-11-14 NOTE — Progress Notes (Signed)
Patient ID: Joel Beck, male   DOB: 06/16/1956, 61 y.o.   MRN: [id number]   Advanced Heart Failure Clinic Note    Date:  11/14/2017   ID:  AJA BOLANDER, DOB 1956/05/23, MRN [id number]  PCP:  [name], MD  Cardiologist:   Dr. Johnsie Cancel   History of Present Illness: Joel Beck is a 61 y.o. male with NICM, HTN,  and chronic systolic heart failure diagnosed in 2015.    Interpreter present.  He presents today for regular follow up. At last visit referred for ICD but did not show up. Has no reason why. He states he ran out of coreg and spironolactone, and assumed he was finished with them so did not resume/refill. He denies DOE, orthopnea, or PND. Appetite has been stable. Denies fevers or chills. Does not weight at home. Taking Entresto and amlodipine as prescribed, though has not taken today.   Echo 07/05/14  EF 25%  Echo 7/17 EF 20-25%  ECHO 05/2017: EF 20-25% Grade II DD Cath by Dr Martinique 2015 with no CAD  Unable to tolerate cMRI due to claustrophobia.   CPX Test 06/2017 Peak VO2: 20.6 (74% predicted peak VO2) VE/VCO2 slope: 28 OUES: 2.55 Peak RER: 0.96 Submax test. Mildly reduced functional capacity. Mild chrontropic incompetence in setting of submax effort. No obvious HF or pulmonary limitation noted.   Past Medical History:  Diagnosis Date  . CHF (congestive heart failure) ([place]) 07/05/2014  . Hypertension Dx 2015    Past Surgical History:  Procedure Laterality Date  . LEFT HEART CATHETERIZATION WITH CORONARY ANGIOGRAM N/A 07/05/2014   Procedure: LEFT HEART CATHETERIZATION WITH CORONARY ANGIOGRAM;  Surgeon: Peter M Martinique, MD;  Location: [facility] CATH LAB;  Service: Cardiovascular;  Laterality: N/A;   Current Outpatient Medications  Medication Sig Dispense Refill  . amLODipine (NORVASC) 10 MG tablet Take 1 tablet (10 mg total) by mouth daily. 30 tablet 6  . aspirin EC 81 MG tablet Take 1 tablet (81 mg total) by mouth daily. (Patient not taking: Reported on 08/29/2017) 90  tablet 3  . carvedilol (COREG) 25 MG tablet Take 1 tablet (25 mg total) by mouth 2 (two) times daily with a meal. (Patient not taking: Reported on 08/29/2017) 60 tablet 11  . furosemide (LASIX) 40 MG tablet Take 1 tablet (40 mg total) by mouth daily as needed for edema. (Patient not taking: Reported on 05/29/2017) 30 tablet 11  . sacubitril-valsartan (ENTRESTO) 97-103 MG Take 1 tablet by mouth 2 (two) times daily. 60 tablet 3  . spironolactone (ALDACTONE) 25 MG tablet Take 1 tablet (25 mg total) by mouth daily. (Patient not taking: Reported on 08/29/2017) 90 tablet 3   No current facility-administered medications for this encounter.    Allergies:   Ace inhibitors   Social History:  The patient  reports that he quit smoking about 40 years ago. he has never used smokeless tobacco. He reports that he drinks about 0.6 oz of alcohol per week. He reports that he does not use drugs.   Family History:  Father had high blood pressure and diabetes. Mother had no cardiac problems. No other pertinent family history.   Review of systems complete and found to be negative unless listed in HPI.    PHYSICAL EXAM: Vitals:   11/14/17 1435  BP: (!) 156/108  Pulse: 80  SpO2: 98%  Weight: 194 lb (88 kg)    Wt Readings from Last 3 Encounters:  11/14/17 194 lb (88 kg)  08/29/17 187 lb  9.6 oz (85.1 kg)  05/29/17 191 lb 12 oz (87 kg)    Physical Exam  General: Well appearing. No resp difficulty. HEENT: Normal Neck: Supple. JVP 5-6. Carotids 2+ bilat; no bruits. No thyromegaly or nodule noted. Cor: PMI nondisplaced. RRR, No M/G/R noted Lungs: CTAB, normal effort. Abdomen: Soft, non-tender, non-distended, no HSM. No bruits or masses. +BS  Extremities: No cyanosis, clubbing, or rash. R and LLE no edema.  Neuro: Alert & orientedx3, cranial nerves grossly intact. moves all 4 extremities w/o difficulty. Affect pleasant   Recent Labs: 11/15/2016: ALT 25 08/29/2017: B Natriuretic Peptide 90.3; BUN 20;  Creatinine, Ser 1.12; Potassium 4.1; Sodium 136   Lipid Panel    Component Value Date/Time   CHOL 183 03/15/2016 1508   TRIG 311 (H) 03/15/2016 1508   HDL 35 (L) 03/15/2016 1508   CHOLHDL 5.2 (H) 03/15/2016 1508   VLDL 62 (H) 03/15/2016 1508   LDLCALC 86 03/15/2016 1508    ASSESSMENT AND PLAN:  1.  Chronic systolic HF: Due to NICM. Suspect HTNin nature. Cath without significant CAD 2015. EF has been down since 2015.  - Echo 05/29/2017 LVEF 20-25%, Grade 2 DD - Volume status stable on exam. Has not needed lasix.  - Continue Entresto 97/103 mg BID. BMET today.  - Continue amlodipine 10 mg daily.  - Resume coreg 6.25 mg BID.  - Consider spiro at next visit if labs stable.  - Pt intolerant to cMRI with claustrophobia.  - CPX 06/2017 with suboptimal effort.  - We again discussed ICD. Discussed ICD with our Spanish Speaking pharmacist interpreting. Marland Kitchen He is agreeable. Refer to EP today.  2. HTN  - Elevated today. Pt had a chocolate bar prior to arrival and did NOT take his medications this morning.  - Meds as above.   Meds and labs as above. EP referral.  4 week follow up with Pharm-D for med-titration. 3 month RTC for MD visit.   Shirley Friar, PA-C  2:36 PM   Greater than 50% of the 30 minute visit was spent in counseling/coordination of care regarding disease state education, salt/fluid restriction, sliding scale diuretics, and medication compliance.

## 2017-11-14 NOTE — Patient Instructions (Addendum)
Routine lab work today. Will notify you of abnormal results, otherwise no news is good news!  RESTART Carvedilol (Coreg) 3.125 mg tablet twice daily.  Will refer you to electrophysiology at [facility]. Address: [address] ([place]), [place], [place] [address]  Phone: [phone] Their office will call you to schedule.  Follow up 4 weeks with Joel Beck CHF Pharmacist.  _________________________________________________________ Joel Beck Code: 9000  Follow up 3 months with Dr. [name].  __________________________________________________________ Joel Beck Code: 9002  Take all medication as prescribed the day of your appointment. Bring all medications with you to your appointment.  Do the following things EVERYDAY: 1) Weigh yourself in the morning before breakfast. Write it down and keep it in a log. 2) Take your medicines as prescribed 3) Eat low salt foods-Limit salt (sodium) to 2000 mg per day.  4) Stay as active as you can everyday 5) Limit all fluids for the day to less than 2 liters

## 2017-11-22 MED FILL — CARVEDILOL 3.125 MG TABLET: 3.125 | 30 days supply | Qty: 60 | Fill #0

## 2017-11-27 DIAGNOSIS — I10 Essential (primary) hypertension: Secondary | ICD-10-CM | POA: Insufficient documentation

## 2017-12-11 ENCOUNTER — Other Ambulatory Visit (HOSPITAL_COMMUNITY): Payer: Medicare Other

## 2017-12-12 ENCOUNTER — Other Ambulatory Visit (HOSPITAL_COMMUNITY): Payer: Medicare Other

## 2017-12-13 ENCOUNTER — Ambulatory Visit: Payer: Medicare Other | Admitting: Internal Medicine

## 2017-12-25 ENCOUNTER — Ambulatory Visit: Payer: Medicare Other | Admitting: Internal Medicine

## 2018-01-01 ENCOUNTER — Ambulatory Visit (HOSPITAL_COMMUNITY)
Admission: RE | Admit: 2018-01-01 | Discharge: 2018-01-01 | Disposition: A | Discharge status: Home / Self Care | Payer: Medicare Other | Source: Ambulatory Visit | Attending: Cardiology | Admitting: Cardiology

## 2018-01-01 VITALS — BP 134/74 | HR 96 | Wt 188.0 lb

## 2018-01-01 DIAGNOSIS — I110 Hypertensive heart disease with heart failure: Secondary | ICD-10-CM | POA: Insufficient documentation

## 2018-01-01 DIAGNOSIS — I428 Other cardiomyopathies: Secondary | ICD-10-CM | POA: Diagnosis not present

## 2018-01-01 DIAGNOSIS — I10 Essential (primary) hypertension: Secondary | ICD-10-CM

## 2018-01-01 DIAGNOSIS — I5022 Chronic systolic (congestive) heart failure: Secondary | ICD-10-CM | POA: Diagnosis not present

## 2018-01-01 MED ORDER — SPIRONOLACTONE 25 MG PO TABS: 25.0000 mg | ORAL_TABLET | Freq: Every day | ORAL | 3 refills | Status: DC

## 2018-01-01 MED FILL — SPIRONOLACTONE 25 MG TABS: 25 | 30 days supply | Qty: 30 | Fill #0

## 2018-01-01 NOTE — Progress Notes (Signed)
HF MD: [name]  HPI:  Joel Beck is a 62 y.o. male with NICM, HTN,  and chronic systolic heart failure diagnosed in 2015.    He presents today for pharmacist-led HF medication titration. At last HF clinic visit on 11/14/17, carvedilol was started at 3.125 mg BID. He denies DOE, orthopnea, or PND. Appetite has been stable. Denies fevers or chills. Does not weight at home. Reports better medication compliance and did take his AM medications today.     . Shortness of breath/dyspnea on exertion? no  . Orthopnea/PND? no . Edema? no . Lightheadedness/dizziness? no . Daily weights at home? no . Blood pressure/heart rate monitoring at home? no . Following low-sodium/fluid-restricted diet? yes  HF Medications: Carvedilol 3.125 mg PO BID Furosemide 40 mg PO PRN edema Entresto 97-103 mg PO BID  Has the patient been experiencing any side effects to the medications prescribed?  no  Does the patient have any problems obtaining medications due to transportation or finances?   No - Medicare Part D  Understanding of regimen: fair Understanding of indications: fair Potential of compliance: good Patient understands to avoid NSAIDs. Patient understands to avoid decongestants.    Pertinent Lab Values: . 11/14/17: Serum creatinine 1.17, BUN 21, Potassium 4.3, Sodium 139  Vital Signs: . Weight: 188 lb (dry weight: 190 lb) . Blood pressure: 134/74 mmHg  . Heart rate: 96 bpm   Assessment: 1. Chronicsystolic CHF (EF 42-59%), due to NICM. NYHA class IIsymptoms. - Volume status stable on exam. Has not needed lasix.  - Restart spironolactone 25 mg daily - Continue Entresto 97/103 mg BID, carvedilol 3.125 mg BID, furosemide 40 mg PRN edema - Basic disease state pathophysiology, medication indication, mechanism and side effects reviewed at length with patient and he verbalized understanding 2. HTN  - Slightly above goal <130/80 mmHg today - Restarting spironolactone as above   Plan: 1)  Medication changes: Based on clinical presentation, vital signs and recent labs will start spironolactone 25 mg daily  2) Labs: BMET in 2 weeks  3) Follow-up: Dr. [name] on 02/13/18    Ruta Hinds. Velva Harman, PharmD, BCPS, CPP Clinical Pharmacist Pager: [phone] Phone: [phone] 01/01/2018 2:28 PM

## 2018-01-01 NOTE — Patient Instructions (Addendum)
Por favor TOME spironolactone una tableta (25 mg) cada dia.   Tiene una cita por un analisis de Apolinar Junes 01/16/18 a las 2:30 PM.   Wilma Flavin cita con Dr. [name] 02/13/18 a las 2:40 PM.

## 2018-01-06 ENCOUNTER — Ambulatory Visit (INDEPENDENT_AMBULATORY_CARE_PROVIDER_SITE_OTHER): Payer: Medicare Other | Admitting: Internal Medicine

## 2018-01-06 ENCOUNTER — Encounter: Payer: Self-pay | Admitting: Internal Medicine

## 2018-01-06 VITALS — BP 124/84 | HR 85 | Ht 66.0 in | Wt 188.0 lb

## 2018-01-06 DIAGNOSIS — I10 Essential (primary) hypertension: Secondary | ICD-10-CM

## 2018-01-06 DIAGNOSIS — I5022 Chronic systolic (congestive) heart failure: Secondary | ICD-10-CM | POA: Diagnosis not present

## 2018-01-06 DIAGNOSIS — I428 Other cardiomyopathies: Secondary | ICD-10-CM

## 2018-01-06 NOTE — Progress Notes (Signed)
Electrophysiology Office Note   Date:  01/06/2018   ID:  Joel Beck, DOB November 12, 1956, MRN [id number]  PCP:  [name], MD  Cardiologist:  Dr Johnsie Cancel Also followed in Advanced heart failure clinic    CC: CHF   History of Present Illness: Joel Beck is a 62 y.o. male who presents today for electrophysiology evaluation.   The patient is referred by Dr Joel Beck and Joel Beck for EP consultation regarding risk stratification of sudden death.  The patient has a chronically depressed EF (by echo has suppressed since at least 2015).   He has been followed in the advanced heart failure clinic.  He has been treated with medicine optimization but continues to have reduced EF (20%) by echo 05/29/17.  History is per translator today. Pt reports that he can walk about 4 blocks without difficulty.  He feels that his CHF is improving when he takes his medicines (compliance has been an issue).  Today, he denies symptoms of palpitations, chest pain, orthopnea, PND, lower extremity edema, claudication, dizziness, presyncope, syncope, bleeding, or neurologic sequela. The patient is tolerating medications without difficulties and is otherwise without complaint today.    Past Medical History:  Diagnosis Date  . CHF (congestive heart failure) ([place]) 07/05/2014  . Hypertension Dx 2015   Past Surgical History:  Procedure Laterality Date  . LEFT HEART CATHETERIZATION WITH CORONARY ANGIOGRAM N/A 07/05/2014   Procedure: LEFT HEART CATHETERIZATION WITH CORONARY ANGIOGRAM;  Surgeon: Peter M Martinique, MD;  Location[facility] CATH LAB;  Service: Cardiovascular;  Laterality: N/A;     Current Outpatient Medications  Medication Sig Dispense Refill  . aspirin EC 81 MG tablet Take 1 tablet (81 mg total) by mouth daily. 90 tablet 3  . carvedilol (COREG) 3.125 MG tablet Take 1 tablet (3.125 mg total) by mouth 2 (two) times daily with a meal. 60 tablet 6  . sacubitril-valsartan (ENTRESTO) 97-103 MG Take 1 tablet by  mouth 2 (two) times daily. 60 tablet 3  . amLODipine (NORVASC) 10 MG tablet Take 1 tablet (10 mg total) by mouth daily. (Patient not taking: Reported on 01/06/2018) 30 tablet 6  . furosemide (LASIX) 40 MG tablet Take 1 tablet (40 mg total) by mouth daily as needed for edema. (Patient not taking: Reported on 01/06/2018) 30 tablet 11  . naproxen sodium (ALEVE) 220 MG tablet Take 220 mg by mouth daily as needed.    Marland Kitchen spironolactone (ALDACTONE) 25 MG tablet Take 1 tablet (25 mg total) by mouth daily. (Patient not taking: Reported on 01/06/2018) 90 tablet 3   No current facility-administered medications for this visit.     Allergies:   Ace inhibitors   Social History:  The patient  reports that he quit smoking about 41 years ago. he has never used smokeless tobacco. He reports that he drinks about 0.6 oz of alcohol per week. He reports that he does not use drugs.   Family History:  The patient's father had HTN and DM.  Mother no CV issues   ROS:  Please see the history of present illness.   All other systems are personally reviewed and negative.    PHYSICAL EXAM: VS:  BP 124/84   Pulse 85   Ht 5\' 6"  (1.676 m)   Wt 188 lb (85.3 kg)   SpO2 99%   BMI 30.34 kg/m  , BMI Body mass index is 30.34 kg/m. GEN: Well nourished, well developed, in no acute distress  HEENT: normal  Neck: no JVD, carotid  bruits, or masses Cardiac: RRR; no murmurs, rubs, or gallops,no edema  Respiratory:  clear to auscultation bilaterally, normal work of breathing GI: soft, nontender, nondistended, + BS MS: no deformity or atrophy  Skin: warm and dry  Neuro:  Strength and sensation are intact Psych: euthymic mood, full affect  EKG:  EKG is ordered today. The ekg ordered today is personally reviewed and shows sinus rhythm 94 bpm, PR interval 152 msec, QRS 100 msec, LVH w repolarization abnormality   Recent Labs: 08/29/2017: B Natriuretic Peptide 90.3 11/14/2017: BUN 21; Creatinine, Ser 1.17; Potassium 4.3; Sodium 139   personally reviewed   Lipid Panel     Component Value Date/Time   CHOL 183 03/15/2016 1508   TRIG 311 (H) 03/15/2016 1508   HDL 35 (L) 03/15/2016 1508   CHOLHDL 5.2 (H) 03/15/2016 1508   VLDL 62 (H) 03/15/2016 1508   LDLCALC 86 03/15/2016 1508   personally reviewed   Wt Readings from Last 3 Encounters:  01/06/18 188 lb (85.3 kg)  01/01/18 188 lb (85.3 kg)  11/14/17 194 lb (88 kg)      Other studies personally reviewed: Additional studies/ records that were reviewed today include: CHF clinic notes, prior echos, ekgs  Review of the above records today demonstrates: as above   ASSESSMENT AND PLAN:  1.  Nonischemic CM Narrow QRS Intermittent medicine compliance I had a long discussion with the patient via translator today regarding pros and cons of an ICD. He is very clear that he has no interests in an ICD at this time.  Given his overall life preferences, I think that he is making the correct decision for him.  I would advise medical management as per patient request going forward  No further EP management plans   Follow-up with CHF clinic as scheduled I will see as needed going forward  Current medicines are reviewed at length with the patient today.   The patient does not have concerns regarding his medicines.  The following changes were made today:  none    Signed, [name], MD  01/06/2018 2:53 PM     [place] [address] [address] [phone] (office) [phone] (fax)

## 2018-01-06 NOTE — Patient Instructions (Addendum)
Medication Instructions:  Your physician recommends that you continue on your current medications as directed. Please refer to the Current Medication list given to you today.  Labwork: None ordered.  Testing/Procedures: None ordered.  Follow-Up: Your physician wants you to follow-up with Dr. Rayann Heman as needed.  Any Other Special Instructions Will Be Listed Below (If Applicable).  If you need a refill on your cardiac medications before your next appointment, please call your pharmacy.

## 2018-01-06 NOTE — Progress Notes (Signed)
Thank you!   Legrand Como [address] [place], [place] 01/06/2018 3:34 PM

## 2018-01-16 ENCOUNTER — Other Ambulatory Visit (HOSPITAL_COMMUNITY): Payer: Medicare Other

## 2018-02-13 ENCOUNTER — Ambulatory Visit (HOSPITAL_COMMUNITY)
Admission: RE | Admit: 2018-02-13 | Discharge: 2018-02-13 | Disposition: A | Discharge status: Home / Self Care | Payer: Medicare Other | Source: Ambulatory Visit | Attending: Internal Medicine | Admitting: Internal Medicine

## 2018-02-13 ENCOUNTER — Encounter (HOSPITAL_COMMUNITY): Payer: Self-pay | Admitting: Internal Medicine

## 2018-02-13 VITALS — BP 165/118 | HR 77 | Wt 191.8 lb

## 2018-02-13 DIAGNOSIS — I10 Essential (primary) hypertension: Secondary | ICD-10-CM | POA: Diagnosis not present

## 2018-02-13 DIAGNOSIS — I5022 Chronic systolic (congestive) heart failure: Secondary | ICD-10-CM | POA: Insufficient documentation

## 2018-02-13 LAB — BASIC METABOLIC PANEL
ANION GAP: 8 (ref 5–15)
BUN: 16 mg/dL (ref 6–20)
CALCIUM: 8.8 mg/dL — AB (ref 8.9–10.3)
CO2: 25 mmol/L (ref 22–32)
Chloride: 107 mmol/L (ref 101–111)
Creatinine, Ser: 1.26 mg/dL — ABNORMAL HIGH (ref 0.61–1.24)
Glucose, Bld: 88 mg/dL (ref 65–99)
POTASSIUM: 4.2 mmol/L (ref 3.5–5.1)
Sodium: 140 mmol/L (ref 135–145)

## 2018-02-13 LAB — BRAIN NATRIURETIC PEPTIDE: B Natriuretic Peptide: 189.6 pg/mL — ABNORMAL HIGH (ref 0.0–100.0)

## 2018-02-13 NOTE — Patient Instructions (Signed)
Your physician has requested that you have an echocardiogram. Echocardiography is a painless test that uses sound waves to create images of your heart. It provides your doctor with information about the size and shape of your heart and how well your heart's chambers and valves are working. This procedure takes approximately one hour. There are no restrictions for this procedure.  Your physician recommends that you schedule a follow-up appointment in: 3 months  

## 2018-02-13 NOTE — Progress Notes (Signed)
Patient ID: Joel Beck, male   DOB: 03/03/1956, 62 y.o.   MRN: [id number]   Advanced Heart Failure Clinic Note    Date:  02/13/2018   ID:  Joel Beck, DOB 10/07/1956, MRN [id number]  PCP:  Joel Nearing, MD  Cardiologist:   Dr. Johnsie Cancel   History of Present Illness: Joel Beck is a 62 y.o. male with NICM, HTN,  and chronic systolic heart failure diagnosed in 2015.    Interpreter present.  He presents today for regular follow up.    At last visit referred for ICD but did not show up. Has no reason why. He states he ran out of coreg and spironolactone, and assumed he was finished with them so did not resume/refill. However when pushin him on this he is really not sure which meds he is on and which ran out. He denies DOE, orthopnea, or PND. Says he feels very good. Gets SOB only if he really pushes himself otherwise can do anything he wants without limitation. Appetite has been stable. Denies fevers or chills. Does not weight at home. Did not take meds this am he says.   Echo 07/05/14  EF 25%  Echo 7/17 EF 20-25%  ECHO 05/2017: EF 20-25% Grade II DD Cath by Dr Beck 2015 with no CAD  Unable to tolerate cMRI due to claustrophobia.   CPX Test 06/2017 Peak VO2: 20.6 (74% predicted peak VO2) VE/VCO2 slope: 28 OUES: 2.55 Peak RER: 0.96 Submax test. Mildly reduced functional capacity. Mild chrontropic incompetence in setting of submax effort. No obvious HF or pulmonary limitation noted.   Past Medical History:  Diagnosis Date  . CHF (congestive heart failure) ([place]) 07/05/2014  . Hypertension Dx 2015    Past Surgical History:  Procedure Laterality Date  . LEFT HEART CATHETERIZATION WITH CORONARY ANGIOGRAM N/A 07/05/2014   Procedure: LEFT HEART CATHETERIZATION WITH CORONARY ANGIOGRAM;  Surgeon: Joel M Martinique, MD;  Location: [facility] CATH LAB;  Service: Cardiovascular;  Laterality: N/A;   Current Outpatient Medications  Medication Sig Dispense Refill  . amLODipine (NORVASC) 10 MG  tablet Take 1 tablet (10 mg total) by mouth daily. 30 tablet 6  . aspirin EC 81 MG tablet Take 1 tablet (81 mg total) by mouth daily. 90 tablet 3  . carvedilol (COREG) 3.125 MG tablet Take 1 tablet (3.125 mg total) by mouth 2 (two) times daily with a meal. 60 tablet 6  . furosemide (LASIX) 40 MG tablet Take 1 tablet (40 mg total) by mouth daily as needed for edema. 30 tablet 11  . naproxen sodium (ALEVE) 220 MG tablet Take 220 mg by mouth daily as needed.    . sacubitril-valsartan (ENTRESTO) 97-103 MG Take 1 tablet by mouth 2 (two) times daily. 60 tablet 3  . spironolactone (ALDACTONE) 25 MG tablet Take 1 tablet (25 mg total) by mouth daily. 90 tablet 3   No current facility-administered medications for this encounter.    Allergies:   Ace inhibitors   Social History:  The patient  reports that he quit smoking about 41 years ago. he has never used smokeless tobacco. He reports that he drinks about 0.6 oz of alcohol per week. He reports that he does not use drugs.   Family History:  Father had high blood pressure and diabetes. Mother had no cardiac problems. No other pertinent family history.   Review of systems complete and found to be negative unless listed in HPI.    PHYSICAL EXAM: Vitals:   02/13/18 1426  BP: (!) 165/118  Pulse: 77  SpO2: 100%  Weight: 191 lb 12.8 oz (87 kg)    Wt Readings from Last 3 Encounters:  02/13/18 191 lb 12.8 oz (87 kg)  01/06/18 188 lb (85.3 kg)  01/01/18 188 lb (85.3 kg)    Physical Exam  General: Well appearing. No resp difficulty. HEENT: Normal Anicteric Neck: Supple. JVP 5-6. Carotids 2+ bilat; no bruits. No thyromegaly or nodule noted. Cor: PMI nondisplaced. RRR, No M/G/R noted Lungs: CTAB, normal effort. No wheeze Abdomen: Soft, non-tender, non-distended, no HSM. No bruits or masses. +BS  Extremities: No cyanosis, clubbing, or rash. R and LLE no edema.  Neuro: Alert & orientedx3, cranial nerves grossly intact. moves all 4 extremities w/o  difficulty. Affect pleasant   Recent Labs: 08/29/2017: B Natriuretic Peptide 90.3 11/14/2017: BUN 21; Creatinine, Ser 1.17; Potassium 4.3; Sodium 139   Lipid Panel    Component Value Date/Time   CHOL 183 03/15/2016 1508   TRIG 311 (H) 03/15/2016 1508   HDL 35 (L) 03/15/2016 1508   CHOLHDL 5.2 (H) 03/15/2016 1508   VLDL 62 (H) 03/15/2016 1508   LDLCALC 86 03/15/2016 1508    ASSESSMENT AND PLAN:  1.  Chronic systolic HF: Due to NICM. Suspect HTNin nature. Cath without significant CAD 2015. EF has been down since 2015.  - Echo 05/29/2017 LVEF 20-25%, Grade 2 DD - Volume status stable on exam. Has not needed lasix.  - Patient unsure which HF meds he was on so he went home picked up his meds and came back and it looks like he is taking all his meds but hasn't taken them this am. We met together with PharmD and addressed his medicines one-by-one.  - Continue Entresto 97/103 mg BID. BMET today.  - Continue amlodipine 10 mg daily.  - Continue coreg 3.125 mg BID. Has not tolerated higher doses - Continue spiro 25 daily - Pt intolerant to cMRI with claustrophobia.  - CPX 06/2017 with suboptimal effort.  - We discussed ICD and he remains hesitant to proceed. Wants to think about it some more  - Will repeat echo to reassess EF 2. HTN  - Elevated today in settng of not taking med..  - Meds as above.  - 4 week follow up with Pharm-D for recheck and med-titration. 3 month RTC for MD visit.   Glori Bickers, MD  2:43 PM

## 2018-03-06 ENCOUNTER — Other Ambulatory Visit (HOSPITAL_COMMUNITY): Payer: Medicare Other

## 2018-03-06 DIAGNOSIS — R0989 Other specified symptoms and signs involving the circulatory and respiratory systems: Secondary | ICD-10-CM

## 2018-03-12 ENCOUNTER — Encounter (HOSPITAL_COMMUNITY): Payer: Self-pay | Admitting: Radiology

## 2018-03-18 ENCOUNTER — Encounter (HOSPITAL_COMMUNITY): Payer: Self-pay | Admitting: *Deleted

## 2018-03-18 ENCOUNTER — Encounter: Payer: Self-pay | Admitting: Internal Medicine

## 2018-03-18 NOTE — Progress Notes (Signed)
Pt no showed for echo on 03/06/18, letter mailed to pt to call and reschedule.

## 2018-03-20 MED FILL — CARVEDILOL 3.125 MG TABLET: 3.125 | 30 days supply | Qty: 60 | Fill #1

## 2018-03-28 ENCOUNTER — Telehealth (HOSPITAL_COMMUNITY): Payer: Self-pay | Admitting: Internal Medicine

## 2018-03-28 NOTE — Telephone Encounter (Signed)
03-10-18 called to resc echo--mailbox was full.dp 03/06/18 No show for echocardiogram EVD 03/12/18 mailbox is full-can not leave message-will mail letter EVD 03/18/18 [id number] mailbox full , mailed letter [facility]

## 2018-04-10 ENCOUNTER — Other Ambulatory Visit (HOSPITAL_COMMUNITY): Payer: Medicare Other

## 2018-05-06 ENCOUNTER — Ambulatory Visit (HOSPITAL_COMMUNITY): Payer: Medicare Other | Attending: Internal Medicine

## 2018-05-06 ENCOUNTER — Other Ambulatory Visit: Payer: Self-pay

## 2018-05-06 DIAGNOSIS — I110 Hypertensive heart disease with heart failure: Secondary | ICD-10-CM | POA: Diagnosis not present

## 2018-05-06 DIAGNOSIS — I5022 Chronic systolic (congestive) heart failure: Secondary | ICD-10-CM | POA: Diagnosis not present

## 2018-05-06 DIAGNOSIS — I080 Rheumatic disorders of both mitral and aortic valves: Secondary | ICD-10-CM | POA: Diagnosis not present

## 2018-05-22 ENCOUNTER — Encounter (HOSPITAL_COMMUNITY): Payer: Medicare Other

## 2018-06-20 MED FILL — CARVEDILOL 3.125 MG TABLET: 3.125 | 30 days supply | Qty: 60 | Fill #2

## 2018-06-20 MED FILL — SPIRONOLACTONE 25 MG TABLET: 25 | 30 days supply | Qty: 30 | Fill #1

## 2018-07-07 ENCOUNTER — Encounter (HOSPITAL_COMMUNITY): Payer: Self-pay

## 2018-07-07 ENCOUNTER — Ambulatory Visit (HOSPITAL_COMMUNITY)
Admission: RE | Admit: 2018-07-07 | Discharge: 2018-07-07 | Disposition: A | Discharge status: Home / Self Care | Payer: Medicare Other | Source: Ambulatory Visit | Attending: Internal Medicine | Admitting: Internal Medicine

## 2018-07-07 VITALS — BP 154/96 | HR 82 | Wt 186.8 lb

## 2018-07-07 DIAGNOSIS — I5022 Chronic systolic (congestive) heart failure: Secondary | ICD-10-CM | POA: Insufficient documentation

## 2018-07-07 DIAGNOSIS — Z959 Presence of cardiac and vascular implant and graft, unspecified: Secondary | ICD-10-CM | POA: Insufficient documentation

## 2018-07-07 DIAGNOSIS — Z87891 Personal history of nicotine dependence: Secondary | ICD-10-CM | POA: Diagnosis not present

## 2018-07-07 DIAGNOSIS — I10 Essential (primary) hypertension: Secondary | ICD-10-CM

## 2018-07-07 DIAGNOSIS — Z7982 Long term (current) use of aspirin: Secondary | ICD-10-CM | POA: Diagnosis not present

## 2018-07-07 DIAGNOSIS — I429 Cardiomyopathy, unspecified: Secondary | ICD-10-CM | POA: Diagnosis not present

## 2018-07-07 DIAGNOSIS — I428 Other cardiomyopathies: Secondary | ICD-10-CM | POA: Diagnosis not present

## 2018-07-07 DIAGNOSIS — Z833 Family history of diabetes mellitus: Secondary | ICD-10-CM | POA: Insufficient documentation

## 2018-07-07 DIAGNOSIS — I110 Hypertensive heart disease with heart failure: Secondary | ICD-10-CM | POA: Insufficient documentation

## 2018-07-07 DIAGNOSIS — Z79899 Other long term (current) drug therapy: Secondary | ICD-10-CM | POA: Insufficient documentation

## 2018-07-07 LAB — BASIC METABOLIC PANEL
Anion gap: 7 (ref 5–15)
BUN: 18 mg/dL (ref 6–20)
CHLORIDE: 107 mmol/L (ref 98–111)
CO2: 26 mmol/L (ref 22–32)
CREATININE: 1.1 mg/dL (ref 0.61–1.24)
Calcium: 8.9 mg/dL (ref 8.9–10.3)
GFR calc Af Amer: >60 mL/min (ref >60)
GFR calc non Af Amer: >60 mL/min (ref >60)
GLUCOSE: 125 mg/dL — AB (ref 70–99)
POTASSIUM: 3.8 mmol/L (ref 3.5–5.1)
SODIUM: 140 mmol/L (ref 135–145)

## 2018-07-07 MED ORDER — CARVEDILOL 3.125 MG PO TABS: 3.1250 mg | ORAL_TABLET | Freq: Two times a day (BID) | ORAL | 11 refills | Status: DC

## 2018-07-07 MED ORDER — SACUBITRIL-VALSARTAN 97-103 MG PO TABS: 1.0000 | ORAL_TABLET | Freq: Two times a day (BID) | ORAL | 3 refills | Status: DC

## 2018-07-07 MED ORDER — AMLODIPINE BESYLATE 10 MG PO TABS: 10.0000 mg | ORAL_TABLET | Freq: Every day | ORAL | 11 refills | Status: DC

## 2018-07-07 MED ORDER — SPIRONOLACTONE 25 MG PO TABS: 25.0000 mg | ORAL_TABLET | Freq: Every day | ORAL | 11 refills | Status: DC

## 2018-07-07 NOTE — Progress Notes (Signed)
Patient ID: Joel Beck, male   DOB: March 06, 1956, 62 y.o.   MRN: [id number]   Advanced Heart Failure Clinic Note    Date:  07/07/2018   ID:  Joel Beck, DOB Jun 21, 1956, MRN [id number]  PCP:  [name], MD  Cardiologist:   Dr. Johnsie Cancel   History of Present Illness: Joel Beck is a 62 y.o. male with NICM, HTN,  and chronic systolic heart failure diagnosed in 2015.    Interpreter present.   He presents today for regular follow up. He has been doing well since he saw [place] last. He has spironolactone and Entresto here today, but says he has two other pills at home, though he is not sure what they are. He denies SOB, DOE, orthopnea, or PND. No swelling. He has occasional "chest pressure" that feels like a burning at times, it is most often related to stress or anxiety. Improves with drinking cool water. It is not associated with exertion. He does not know the names of his medications. He states he is not sure if he wants an ICD.   Echo 07/05/14  EF 25%  Echo 7/17 EF 20-25%  ECHO 05/2017: EF 20-25% Grade II DD Echo 05/2018: EF 20-25%, Grade 1 DD.  Cath by Dr Martinique 2015 with no CAD  Unable to tolerate cMRI due to claustrophobia.   CPX Test 06/2017 Peak VO2: 20.6 (74% predicted peak VO2) VE/VCO2 slope: 28 OUES: 2.55 Peak RER: 0.96 Submax test. Mildly reduced functional capacity. Mild chrontropic incompetence in setting of submax effort. No obvious HF or pulmonary limitation noted.   Past Medical History:  Diagnosis Date  . CHF (congestive heart failure) ([place]) 07/05/2014  . Hypertension Dx 2015    Past Surgical History:  Procedure Laterality Date  . LEFT HEART CATHETERIZATION WITH CORONARY ANGIOGRAM N/A 07/05/2014   Procedure: LEFT HEART CATHETERIZATION WITH CORONARY ANGIOGRAM;  Surgeon: Peter M Martinique, MD;  Location: [facility] CATH LAB;  Service: Cardiovascular;  Laterality: N/A;   Current Outpatient Medications  Medication Sig Dispense Refill  . aspirin EC 81 MG tablet Take 1 tablet  (81 mg total) by mouth daily. 90 tablet 3  . naproxen sodium (ALEVE) 220 MG tablet Take 220 mg by mouth daily as needed.    . sacubitril-valsartan (ENTRESTO) 97-103 MG Take 1 tablet by mouth 2 (two) times daily. 60 tablet 3  . spironolactone (ALDACTONE) 25 MG tablet Take 1 tablet (25 mg total) by mouth daily. 90 tablet 3   No current facility-administered medications for this encounter.    Allergies:   Ace inhibitors   Social History:  The patient  reports that he quit smoking about 41 years ago. He has never used smokeless tobacco. He reports that he drinks about 0.6 oz of alcohol per week. He reports that he does not use drugs.   Family History:  Father had high blood pressure and diabetes. Mother had no cardiac problems. No other pertinent family history.   Review of systems complete and found to be negative unless listed in HPI.    PHYSICAL EXAM: Vitals:   07/07/18 1437  BP: (!) 154/96  Pulse: 82  SpO2: 96%  Weight: 186 lb 12.8 oz (84.7 kg)     Wt Readings from Last 3 Encounters:  07/07/18 186 lb 12.8 oz (84.7 kg)  02/13/18 191 lb 12.8 oz (87 kg)  01/06/18 188 lb (85.3 kg)    Physical Exam General: Well appearing. No resp difficulty. HEENT: Normal Neck: Supple. JVP 5-6. Carotids 2+  bilat; no bruits. No thyromegaly or nodule noted. Cor: PMI nondisplaced. RRR, No M/G/R noted Lungs: CTAB, normal effort. Abdomen: Soft, non-tender, non-distended, no HSM. No bruits or masses. +BS  Extremities: No cyanosis, clubbing, or rash. R and LLE no edema.  Neuro: Alert & orientedx3, cranial nerves grossly intact. moves all 4 extremities w/o difficulty. Affect pleasant   ASSESSMENT AND PLAN:  1.  Chronic systolic HF: Due to NICM. Suspect HTN in nature. Cath without significant CAD 2015. EF has been down since 2015.  - Echo 05/2018 LVEF 20-25%, Grade 1 DD.  - NYHA I-II symptoms.  - Volume status stable on exam.   - Continue Entresto 97/103 mg BID.  - Add amlodipine 10 mg daily. It is  unclear if h - Add coreg 3.125 mg BID. Has not tolerated higher doses - Continue spiro 25 daily - Pt intolerant to cMRI with claustrophobia.  - CPX 06/2017 with suboptimal effort.  - Recommend ICD. He has been considering.  2. HTN  - Elevated today but some confusion over his meds. We will restart his previous regimen and have close pharmacy follow up.   Discussed ICD at length today. He understands he is at risk for sudden cardiac death but continues to defer conversation. Interpreter greatly assisted in this discussion and expressed some of the concerns the patient has concerning the actual procedure. He will come to his next MD visit ready to decide yes or no. Labs today.   Shirley Friar, PA-C  2:36 PM   Greater than 50% of the 30 minute visit was spent in counseling/coordination of care regarding disease state education, salt/fluid restriction, sliding scale diuretics, and medication compliance.

## 2018-07-07 NOTE — Patient Instructions (Signed)
RESTART Coreg 3./125 mg, one tab twice a day RESTART Amlodipine 10 mg ,one tab daily  Labs today We will only contact you if something comes back abnormal or we need to make some changes. Otherwise no news is good news!  Your physician recommends that you schedule a follow-up appointment in: 2-3 weeks with CHF CL[facility] Pharm D  Your physician recommends that you schedule a follow-up appointment in: 2 months with Dr [name]   Do the following things EVERYDAY: 1) Weigh yourself in the morning before breakfast. Write it down and keep it in a log. 2) Take your medicines as prescribed 3) Eat low salt foods-Limit salt (sodium) to 2000 mg per day.  4) Stay as active as you can everyday 5) Limit all fluids for the day to less than 2 liters

## 2018-07-24 ENCOUNTER — Ambulatory Visit (HOSPITAL_COMMUNITY)
Admission: RE | Admit: 2018-07-24 | Discharge: 2018-07-24 | Disposition: A | Discharge status: Home / Self Care | Payer: Medicare Other | Source: Ambulatory Visit | Attending: Internal Medicine | Admitting: Internal Medicine

## 2018-07-24 DIAGNOSIS — I428 Other cardiomyopathies: Secondary | ICD-10-CM | POA: Diagnosis not present

## 2018-07-24 DIAGNOSIS — I110 Hypertensive heart disease with heart failure: Secondary | ICD-10-CM | POA: Diagnosis not present

## 2018-07-24 DIAGNOSIS — I5022 Chronic systolic (congestive) heart failure: Secondary | ICD-10-CM | POA: Diagnosis not present

## 2018-07-24 MED ORDER — AMLODIPINE BESYLATE 10 MG PO TABS: 10.0000 mg | ORAL_TABLET | Freq: Every day | ORAL | 11 refills | Status: DC

## 2018-07-24 MED ORDER — SACUBITRIL-VALSARTAN 49-51 MG PO TABS: 1.0000 | ORAL_TABLET | Freq: Two times a day (BID) | ORAL | 5 refills | Status: DC

## 2018-07-24 MED FILL — AMLODIPINE BESYLATE 10 MG T: 10 | 30 days supply | Qty: 30 | Fill #0

## 2018-07-24 NOTE — Progress Notes (Signed)
HF MD: [name]  HPI:  Joel Beck is a 62 y.o. male with NICM, HTN,  and chronic systolic heart failure diagnosed in 2015.    Interpreter present.   He presents today for pharmacist-led HF medication titration with his medication bottles. At last HF clinic visit on 8/5, his carvedilol 3.125 mg BID and amlodipine 10 mg daily were restarted. He has been doing well since he saw [place] last. He did admit to not picking up his amlodipine yet and stopping his Entresto 3 days ago because he felt like it was causing chest and back muscle pain which stopped after d/c of Entresto.    Marland Kitchen Shortness of breath/dyspnea on exertion? no  . Orthopnea/PND? no . Edema? no . Lightheadedness/dizziness? no . Daily weights at home? Yes - stable ~180 lb . Blood pressure/heart rate monitoring at home? no . Following low-sodium/fluid-restricted diet? yes  HF Medications: Carvedilol 3.125 mg PO BID Furosemide 40 mg PO daily PRN SOB/edema Spironolactone 25 mg PO daily   Has the patient been experiencing any side effects to the medications prescribed?  no  Does the patient have any problems obtaining medications due to transportation or finances?   No - Medicare Part D  Understanding of regimen: good Understanding of indications: good Potential of compliance: good Patient understands to avoid NSAIDs. Patient understands to avoid decongestants.    Pertinent Lab Values: . 07/07/18: Serum creatinine 1.10, BUN 18, Potassium 3.8, Sodium 140  Vital Signs: . Weight: 185.2 lb (dry weight: 186-190 lb) . Blood pressure: 168/92 mmHg  . Heart rate: 72 bpm   Assessment: 1. Chronicsystolic CHF (EF 16-10%), due to NICM (?HTN). NYHA class I-IIsymptoms. - Volume status stable on exam. Has not needed lasix.   - Restart lower dose Entresto 49-51 mg BID and amlodipine 10 mg daily  - Continue carvedilol 3.125 mg BID, furosemide 40 mg PRN edema and spironolactone 25 mg daily - Basic disease state pathophysiology,  medication indication, mechanism and side effects reviewed at length with patient and he verbalized understanding  2. HTN  - Elevated today but has not started amlodipine and stopped Entresto 3 days ago - We will restart Entresto at 49-51 mg BID because he states that this dose does not cause the chest/back muscle pain and have asked him to pick up and start taking the amlodipine 10 mg daily  Plan: 1) Medication changes: Based on clinical presentation, vital signs and recent labs will start lower dose Entresto 49-51 mg BID and amlodipine 10 mg daily  2) Labs: PRN  3) Follow-up: Dr. [name] on 09/08/18   Ruta Hinds. Velva Harman, PharmD, BCPS, CPP Clinical Pharmacist Phone: [phone] 07/24/2018 2:56 PM

## 2018-07-24 NOTE — Patient Instructions (Signed)
Empieza amlodipine 10 mg (1 tableta) una vez al dia.   Empieza Entresto 49-51 mg (1 tableta) dose veces al dia.   Tiene cita con Dr. [name] 09/08/18.

## 2018-08-06 MED FILL — CARVEDILOL 3.125 MG TABLET: 3.125 | 30 days supply | Qty: 60 | Fill #3

## 2018-09-08 ENCOUNTER — Encounter (HOSPITAL_COMMUNITY): Payer: Self-pay | Admitting: Internal Medicine

## 2018-09-08 ENCOUNTER — Ambulatory Visit (HOSPITAL_COMMUNITY)
Admission: RE | Admit: 2018-09-08 | Discharge: 2018-09-08 | Disposition: A | Discharge status: Home / Self Care | Payer: Medicare Other | Source: Ambulatory Visit | Attending: Internal Medicine | Admitting: Internal Medicine

## 2018-09-08 ENCOUNTER — Other Ambulatory Visit: Payer: Self-pay

## 2018-09-08 VITALS — BP 137/95 | HR 88 | Wt 187.4 lb

## 2018-09-08 DIAGNOSIS — Z79899 Other long term (current) drug therapy: Secondary | ICD-10-CM | POA: Insufficient documentation

## 2018-09-08 DIAGNOSIS — I428 Other cardiomyopathies: Secondary | ICD-10-CM | POA: Diagnosis not present

## 2018-09-08 DIAGNOSIS — I5022 Chronic systolic (congestive) heart failure: Secondary | ICD-10-CM | POA: Diagnosis not present

## 2018-09-08 DIAGNOSIS — I10 Essential (primary) hypertension: Secondary | ICD-10-CM

## 2018-09-08 DIAGNOSIS — Z833 Family history of diabetes mellitus: Secondary | ICD-10-CM | POA: Insufficient documentation

## 2018-09-08 DIAGNOSIS — Z7982 Long term (current) use of aspirin: Secondary | ICD-10-CM | POA: Diagnosis not present

## 2018-09-08 DIAGNOSIS — Z87891 Personal history of nicotine dependence: Secondary | ICD-10-CM | POA: Insufficient documentation

## 2018-09-08 DIAGNOSIS — I110 Hypertensive heart disease with heart failure: Secondary | ICD-10-CM | POA: Insufficient documentation

## 2018-09-08 DIAGNOSIS — Z9889 Other specified postprocedural states: Secondary | ICD-10-CM | POA: Diagnosis not present

## 2018-09-08 MED ORDER — SACUBITRIL-VALSARTAN 97-103 MG PO TABS: 1.0000 | ORAL_TABLET | Freq: Two times a day (BID) | ORAL | 6 refills | Status: DC

## 2018-09-08 MED FILL — ENTRESTO 97 MG-103 MG TAB: 97-103 | 30 days supply | Qty: 60 | Fill #0

## 2018-09-08 NOTE — Addendum Note (Signed)
Encounter addended by: Scarlette Calico, RN on: 09/08/2018 3:39 PM  Actions taken: Order list changed, Sign clinical note

## 2018-09-08 NOTE — Progress Notes (Signed)
Patient ID: Joel Beck, male   DOB: 12-23-1955, 62 y.o.   MRN: [id number]   Advanced Heart Failure Clinic Note    Date:  09/08/2018   ID:  Joel Beck, DOB [birth year], MRN [id number]  PCP:  [name], MD  Cardiologist:   Dr. Johnsie Cancel   History of Present Illness: Joel Beck is a 62 y.o. male with NICM, HTN,  and chronic systolic heart failure diagnosed in 2015.    Interpreter present.   He returns today for HF follow up. Last visit, coreg 3.125 mg BID and amlodipine 10 mg daily were restarted. Saw pharmD 8/22 and had not started amlodipine and had stopped his Entresto due to chest and back aching. Entresto 49/51 mg BID was restarted. Overall doing well. Now taking all medications, but does not take them all at once because he feels sleepy. Denies SOB with any activity. Able to walk around grocery store with no problem. Can go up steps and do push-ups without a probelm. No edema, orthopnea, or PND. No CP or dizziness. He has occasional burning after eating that resolves with drinking water. He does not want an ICD. Weights at home: 187-190 lbs. Limits salt intake. Drinks a lot of fluid.   Echo 07/05/14  EF 25%  Echo 7/17 EF 20-25%  ECHO 05/2017: EF 20-25% Grade II DD Echo 05/2018: EF 20-25%, Grade 1 DD.  Cath by Dr Martinique 2015 with no CAD  Unable to tolerate cMRI due to claustrophobia.   CPX Test 06/2017 Peak VO2: 20.6 (74% predicted peak VO2) VE/VCO2 slope: 28 OUES: 2.55 Peak RER: 0.96 Submax test. Mildly reduced functional capacity. Mild chrontropic incompetence in setting of submax effort. No obvious HF or pulmonary limitation noted.   Review of systems complete and found to be negative unless listed in HPI.   Past Medical History:  Diagnosis Date  . CHF (congestive heart failure) ([place]) 07/05/2014  . Hypertension Dx 2015    Past Surgical History:  Procedure Laterality Date  . LEFT HEART CATHETERIZATION WITH CORONARY ANGIOGRAM N/A 07/05/2014   Procedure: LEFT HEART  CATHETERIZATION WITH CORONARY ANGIOGRAM;  Surgeon: Beck M Martinique, MD;  Location: [facility] CATH LAB;  Service: Cardiovascular;  Laterality: N/A;   Current Outpatient Medications  Medication Sig Dispense Refill  . amLODipine (NORVASC) 10 MG tablet Take 1 tablet (10 mg total) by mouth daily. 30 tablet 11  . aspirin EC 81 MG tablet Take 1 tablet (81 mg total) by mouth daily. 90 tablet 3  . carvedilol (COREG) 3.125 MG tablet Take 1 tablet (3.125 mg total) by mouth 2 (two) times daily. 60 tablet 11  . furosemide (LASIX) 40 MG tablet Take 40 mg by mouth daily as needed (edema).    . naproxen sodium (ALEVE) 220 MG tablet Take 220 mg by mouth daily as needed.    . sacubitril-valsartan (ENTRESTO) 49-51 MG Take 1 tablet by mouth 2 (two) times daily. 60 tablet 5  . spironolactone (ALDACTONE) 25 MG tablet Take 1 tablet (25 mg total) by mouth daily. 30 tablet 11   No current facility-administered medications for this encounter.    Allergies:   Ace inhibitors   Social History:  The patient  reports that he quit smoking about 41 years ago. He has never used smokeless tobacco. He reports that he drinks about 1.0 standard drinks of alcohol per week. He reports that he does not use drugs.   Family History:  Father had high blood pressure and diabetes. Mother had no cardiac  problems. No other pertinent family history.   Review of systems complete and found to be negative unless listed in HPI.    PHYSICAL EXAM: Vitals:   09/08/18 1448  BP: (!) 137/95  Pulse: 88  SpO2: 100%  Weight: 85 kg (187 lb 6 oz)     Wt Readings from Last 3 Encounters:  09/08/18 85 kg (187 lb 6 oz)  07/24/18 84 kg (185 lb 3.2 oz)  07/07/18 84.7 kg (186 lb 12.8 oz)    Physical Exam General: Well appearing. No resp difficulty. HEENT: Normal Neck: Supple. JVP flat. Carotids 2+ bilat; no bruits. No thyromegaly or nodule noted. Cor: PMI nondisplaced. RRR, No M/G/R noted Lungs: CTAB, normal effort. Abdomen: Soft, non-tender,  non-distended, no HSM. No bruits or masses. +BS  Extremities: No cyanosis, clubbing, or rash. R and LLE no edema.  Neuro: Alert & orientedx3, cranial nerves grossly intact. moves all 4 extremities w/o difficulty. Affect pleasant   ASSESSMENT AND PLAN:  1.  Chronic systolic HF: Due to NICM. Suspect HTN in nature. Cath without significant CAD 2015. EF has been down since 2015.  - Echo 05/2018 LVEF 20-25%, Grade 1 DD.  - NYHA I-II symptoms.  - Volume status stable on exam.  - Continue Entresto 49/51 mg BID. Stopped higher dose with CP and back pain. - Continue amlodipine 10 mg daily.  - Continue coreg 3.125 mg BID. Has not tolerated higher doses - Continue spiro 25 daily - Pt intolerant to cMRI with claustrophobia.  - CPX 06/2017 with suboptimal effort.  - Recommend ICD. He has been considering. He is not interested in ICD today.   2. HTN  - Med adjustments as above   Georgiana Shore, NP  3:08 PM   Patient seen and examined with the above-signed Advanced Practice Provider and/or Housestaff. I personally reviewed laboratory data, imaging studies and relevant notes. I independently examined the patient and formulated the important aspects of the plan. I have edited the note to reflect any of my changes or salient points. I have personally discussed the plan with the patient and/or family.  Doing well. NYHA I-II despite persistent severe LV dysfunction. Volume status looks good. Will try again to raise Entresto to 97/103 bid. Discussed ICD and he is not interested. Aware of risk of SCD.   Glori Bickers, MD  3:30 PM

## 2018-09-08 NOTE — Patient Instructions (Signed)
Increase Entresto to 97/103 mg Twice daily   Your physician recommends that you schedule a follow-up appointment in: 3-4 months

## 2018-09-10 MED FILL — CARVEDILOL 3.125 MG TABLET: 3.125 | 30 days supply | Qty: 60 | Fill #4

## 2018-11-19 MED FILL — CARVEDILOL 3.125 MG TABLET: 3.125 | 30 days supply | Qty: 60 | Fill #0

## 2018-11-21 ENCOUNTER — Encounter (HOSPITAL_COMMUNITY): Payer: Medicare Other

## 2018-11-21 ENCOUNTER — Telehealth (HOSPITAL_COMMUNITY): Payer: Self-pay | Admitting: Pharmacist

## 2018-11-21 NOTE — Telephone Encounter (Signed)
Patient thought that he had Medicare Part D Rx insurance but upon checking, he only has Medicare A/B. [facility] can continue providing his medications at a lower cost including his Entresto at $10 but he needs to apply for patient assistance for the [name] because they cannot continue providing this medication at such a low cost. Spoke with Joel Beck and relayed this information to him. He is aware that he will need to sign the application for [employer] patient assistance at his next clinic visit with [place] on 12/16/18.   Joel Beck. Joel Beck, PharmD, BCPS, CPP Clinical Pharmacist Phone: [phone] 11/21/2018 2:10 PM

## 2018-11-27 MED FILL — ENTRESTO 97 MG-103 MG TAB: 97-103 | 30 days supply | Qty: 60 | Fill #0

## 2018-12-16 ENCOUNTER — Encounter (HOSPITAL_COMMUNITY): Payer: Medicare Other

## 2019-01-08 ENCOUNTER — Encounter (HOSPITAL_COMMUNITY): Payer: Self-pay

## 2019-01-08 ENCOUNTER — Ambulatory Visit (HOSPITAL_COMMUNITY)
Admission: RE | Admit: 2019-01-08 | Discharge: 2019-01-08 | Disposition: A | Discharge status: Home / Self Care | Payer: Medicare Other | Source: Ambulatory Visit | Attending: Internal Medicine | Admitting: Internal Medicine

## 2019-01-08 VITALS — BP 140/100 | HR 71 | Wt 195.6 lb

## 2019-01-08 DIAGNOSIS — I428 Other cardiomyopathies: Secondary | ICD-10-CM | POA: Insufficient documentation

## 2019-01-08 DIAGNOSIS — I110 Hypertensive heart disease with heart failure: Secondary | ICD-10-CM | POA: Diagnosis not present

## 2019-01-08 DIAGNOSIS — Z79899 Other long term (current) drug therapy: Secondary | ICD-10-CM | POA: Insufficient documentation

## 2019-01-08 DIAGNOSIS — I10 Essential (primary) hypertension: Secondary | ICD-10-CM

## 2019-01-08 DIAGNOSIS — Z7982 Long term (current) use of aspirin: Secondary | ICD-10-CM | POA: Insufficient documentation

## 2019-01-08 DIAGNOSIS — F40240 Claustrophobia: Secondary | ICD-10-CM | POA: Insufficient documentation

## 2019-01-08 DIAGNOSIS — I5022 Chronic systolic (congestive) heart failure: Secondary | ICD-10-CM | POA: Insufficient documentation

## 2019-01-08 DIAGNOSIS — Z833 Family history of diabetes mellitus: Secondary | ICD-10-CM | POA: Diagnosis not present

## 2019-01-08 DIAGNOSIS — Z8249 Family history of ischemic heart disease and other diseases of the circulatory system: Secondary | ICD-10-CM | POA: Insufficient documentation

## 2019-01-08 DIAGNOSIS — Z87891 Personal history of nicotine dependence: Secondary | ICD-10-CM | POA: Insufficient documentation

## 2019-01-08 LAB — BASIC METABOLIC PANEL
Anion gap: 10 (ref 5–15)
BUN: 15 mg/dL (ref 8–23)
CO2: 25 mmol/L (ref 22–32)
Calcium: 9.5 mg/dL (ref 8.9–10.3)
Chloride: 104 mmol/L (ref 98–111)
Creatinine, Ser: 0.99 mg/dL (ref 0.61–1.24)
GFR calc Af Amer: >60 mL/min (ref >60)
GFR calc non Af Amer: >60 mL/min (ref >60)
Glucose, Bld: 96 mg/dL (ref 70–99)
Potassium: 4.3 mmol/L (ref 3.5–5.1)
Sodium: 139 mmol/L (ref 135–145)

## 2019-01-08 MED ORDER — SPIRONOLACTONE 25 MG PO TABS: 12.5000 mg | ORAL_TABLET | Freq: Every day | ORAL | 6 refills | Status: DC

## 2019-01-08 MED FILL — SPIRONOLACTONE 25 MG TABLET: 25 | 30 days supply | Qty: 15 | Fill #0

## 2019-01-08 MED FILL — ENTRESTO 97 MG-103 MG TAB: 97-103 | 30 days supply | Qty: 60 | Fill #1

## 2019-01-08 NOTE — Progress Notes (Signed)
Patient ID: Joel Beck, male   DOB: [birth year], 63 y.o.   MRN: [id number]   Advanced Heart Failure Clinic Note    Date:  01/08/2019   ID:  GURKARAN RAHM, DOB 02-Feb-1956, MRN [id number]  PCP:  [name], MD  Cardiologist:   Dr. Johnsie Cancel   History of Present Illness: Joel Beck is a 63 y.o. male with NICM, HTN,  and chronic systolic heart failure diagnosed in 2015.    Today he returns for HF follow up. Last visit entresto was increased to 97-103 mg twice a day. Overall feeling fine. Denies SOB/PND/Orthopnea. Appetite ok. No fever or chills. Weight at home 190 pounds. Taking all medications but he is not when ran out of spironolactone. He brought all medications to his appointment.   Echo 07/05/14  EF 25%  Echo 7/17 EF 20-25%  ECHO 05/2017: EF 20-25% Grade II DD Echo 05/2018: EF 20-25%, Grade 1 DD.  Cath by Dr Martinique 2015 with no CAD  Unable to tolerate cMRI due to claustrophobia.   CPX Test 06/2017 Peak VO2: 20.6 (74% predicted peak VO2) VE/VCO2 slope: 28 OUES: 2.55 Peak RER: 0.96 Submax test. Mildly reduced functional capacity. Mild chrontropic incompetence in setting of submax effort. No obvious HF or pulmonary limitation noted.   Review of systems complete and found to be negative unless listed in HPI.   Past Medical History:  Diagnosis Date  . CHF (congestive heart failure) ([place]) 07/05/2014  . Hypertension Dx 2015    Past Surgical History:  Procedure Laterality Date  . LEFT HEART CATHETERIZATION WITH CORONARY ANGIOGRAM N/A 07/05/2014   Procedure: LEFT HEART CATHETERIZATION WITH CORONARY ANGIOGRAM;  Surgeon: Peter M Martinique, MD;  Location: [facility] CATH LAB;  Service: Cardiovascular;  Laterality: N/A;   Current Outpatient Medications  Medication Sig Dispense Refill  . amLODipine (NORVASC) 10 MG tablet Take 1 tablet (10 mg total) by mouth daily. 30 tablet 11  . aspirin EC 81 MG tablet Take 1 tablet (81 mg total) by mouth daily. 90 tablet 3  . carvedilol (COREG) 3.125 MG  tablet Take 1 tablet (3.125 mg total) by mouth 2 (two) times daily. 60 tablet 11  . furosemide (LASIX) 40 MG tablet Take 40 mg by mouth daily as needed (edema).    . naproxen sodium (ALEVE) 220 MG tablet Take 220 mg by mouth daily as needed.    . sacubitril-valsartan (ENTRESTO) 97-103 MG Take 1 tablet by mouth 2 (two) times daily. 60 tablet 6   No current facility-administered medications for this encounter.    Allergies:   Ace inhibitors   Social History:  The patient  reports that he quit smoking about 42 years ago. He has never used smokeless tobacco. He reports current alcohol use of about 1.0 standard drinks of alcohol per week. He reports that he does not use drugs.   Family History:  Father had high blood pressure and diabetes. Mother had no cardiac problems. No other pertinent family history.   Review of systems complete and found to be negative unless listed in HPI.    PHYSICAL EXAM: Vitals:   01/08/19 0950  BP: (!) 140/100  Pulse: 71  SpO2: 98%  Weight: 88.7 kg (195 lb 9.6 oz)     Wt Readings from Last 3 Encounters:  01/08/19 88.7 kg (195 lb 9.6 oz)  09/08/18 85 kg (187 lb 6 oz)  07/24/18 84 kg (185 lb 3.2 oz)    Physical Exam General:  Well appearing. No resp difficulty HEENT:  normal Neck: supple. no JVD. Carotids 2+ bilat; no bruits. No lymphadenopathy or thryomegaly appreciated. Cor: PMI nondisplaced. Regular rate & rhythm. No rubs, gallops or murmurs. Lungs: clear Abdomen: soft, nontender, nondistended. No hepatosplenomegaly. No bruits or masses. Good bowel sounds. Extremities: no cyanosis, clubbing, rash, edema Neuro: alert & orientedx3, cranial nerves grossly intact. moves all 4 extremities w/o difficulty. Affect pleasant   ASSESSMENT AND PLAN:  1.  Chronic systolic HF: Due to NICM. Suspect HTN in nature. Cath without significant CAD 2015. EF has been down since 2015.  - Echo 05/2018 LVEF 20-25%, Grade 1 DD.  - NYHA I-II symptoms.  -Volume status stable. He  does not require lasix. .  - Continue entresto 97-103 twice a day.  - Continue amlodipine 10 mg daily.  - Continue coreg 3.125 mg BID. Has not tolerated higher doses - Restart 12.5 mg spiro daily. Check BMET today and in 7 days.  - Pt intolerant to cMRI with claustrophobia.  - CPX 06/2017 with suboptimal effort.  - Recommend ICD but he refuses.  - I personally went through his medications and he does not have spironolactone. He is not sure when he last had it. I have asked him to bring all medications to his next appointment.   2. HTN  Elevated. Restart 12.5 mg spironolactone daily.   Reinforced daily medications compliance. Offered HF Paramedicine however he is not interested.  Greater than 50% of the (total minutes 25) visit spent in counseling/coordination of care regarding the above.   Follow up in 3-4 months with Dr [name].     Darrick Grinder, NP  11:33 AM

## 2019-01-08 NOTE — Patient Instructions (Signed)
START Spironolactone 12.5mg  (1/2 tab) daily  Labs today and REPEAT in 7 days We will only contact you if something comes back abnormal or we need to make some changes. Otherwise no news is good news!  Your physician recommends that you schedule a follow-up appointment in: 3 months with [facility].

## 2019-01-13 ENCOUNTER — Other Ambulatory Visit (HOSPITAL_COMMUNITY): Payer: Self-pay

## 2019-01-13 MED ORDER — SACUBITRIL-VALSARTAN 97-103 MG PO TABS: 1.0000 | ORAL_TABLET | Freq: Two times a day (BID) | ORAL | 11 refills | Status: DC

## 2019-01-15 ENCOUNTER — Telehealth (HOSPITAL_COMMUNITY): Payer: Self-pay | Admitting: Licensed Clinical Social Worker

## 2019-01-15 ENCOUNTER — Ambulatory Visit (HOSPITAL_COMMUNITY)
Admission: RE | Admit: 2019-01-15 | Discharge: 2019-01-15 | Disposition: A | Discharge status: Home / Self Care | Payer: Medicare Other | Source: Ambulatory Visit | Attending: Cardiology | Admitting: Cardiology

## 2019-01-15 DIAGNOSIS — I5022 Chronic systolic (congestive) heart failure: Secondary | ICD-10-CM | POA: Insufficient documentation

## 2019-01-15 LAB — BASIC METABOLIC PANEL
Anion gap: 9 (ref 5–15)
BUN: 15 mg/dL (ref 8–23)
CO2: 25 mmol/L (ref 22–32)
Calcium: 9.5 mg/dL (ref 8.9–10.3)
Chloride: 106 mmol/L (ref 98–111)
Creatinine, Ser: 1.22 mg/dL (ref 0.61–1.24)
GFR calc non Af Amer: >60 mL/min (ref >60)
Glucose, Bld: 113 mg/dL — ABNORMAL HIGH (ref 70–99)
Potassium: 4.3 mmol/L (ref 3.5–5.1)
SODIUM: 140 mmol/L (ref 135–145)

## 2019-01-15 NOTE — Telephone Encounter (Signed)
CSW faxed completed [employer] application for entresto assistance in for review  CSW will continue to follow and assist as needed  Jorge Ny, LCSW Clinical Social Worker [facility] [phone]

## 2019-01-20 ENCOUNTER — Telehealth (HOSPITAL_COMMUNITY): Payer: Self-pay | Admitting: Licensed Clinical Social Worker

## 2019-01-20 NOTE — Telephone Encounter (Signed)
CSW called pt to update regarding insurance status.  CSW able to identify that patient has signed up for [facility] plan but patient reports he still has not gotten a card.  CSW called pt to inform of his new insurance coverage and provided with customer service line to reach out and request new card from [place].  CSW will continue to follow and assist as needed  Jorge Ny, LCSW Clinical Social Worker [facility] [phone]

## 2019-02-11 MED FILL — SPIRONOLACTONE 25 MG TABLET: 25 | 30 days supply | Qty: 15 | Fill #1

## 2019-02-11 MED FILL — CARVEDILOL 3.125 MG TABLET: 3.125 | 30 days supply | Qty: 60 | Fill #1

## 2019-04-09 ENCOUNTER — Encounter (HOSPITAL_COMMUNITY): Payer: Self-pay

## 2019-04-16 ENCOUNTER — Other Ambulatory Visit: Payer: Self-pay

## 2019-04-16 ENCOUNTER — Ambulatory Visit (HOSPITAL_COMMUNITY): Admission: RE | Admit: 2019-04-16 | Payer: Commercial Indemnity | Source: Ambulatory Visit

## 2019-04-30 ENCOUNTER — Other Ambulatory Visit: Payer: Self-pay

## 2019-04-30 ENCOUNTER — Emergency Department (HOSPITAL_COMMUNITY)
Admission: EM | Admit: 2019-04-30 | Discharge: 2019-04-30 | Disposition: A | Discharge status: Home / Self Care | Payer: Medicare Other | Attending: Emergency Medicine | Admitting: Emergency Medicine

## 2019-04-30 ENCOUNTER — Encounter (HOSPITAL_COMMUNITY): Payer: Self-pay | Admitting: Emergency Medicine

## 2019-04-30 ENCOUNTER — Emergency Department (HOSPITAL_COMMUNITY): Payer: Medicare Other

## 2019-04-30 DIAGNOSIS — Y999 Unspecified external cause status: Secondary | ICD-10-CM | POA: Insufficient documentation

## 2019-04-30 DIAGNOSIS — Y929 Unspecified place or not applicable: Secondary | ICD-10-CM | POA: Insufficient documentation

## 2019-04-30 DIAGNOSIS — S50311A Abrasion of right elbow, initial encounter: Secondary | ICD-10-CM | POA: Insufficient documentation

## 2019-04-30 DIAGNOSIS — Y939 Activity, unspecified: Secondary | ICD-10-CM | POA: Diagnosis not present

## 2019-04-30 DIAGNOSIS — M7918 Myalgia, other site: Secondary | ICD-10-CM

## 2019-04-30 DIAGNOSIS — M545 Low back pain: Secondary | ICD-10-CM | POA: Insufficient documentation

## 2019-04-30 DIAGNOSIS — M79631 Pain in right forearm: Secondary | ICD-10-CM | POA: Diagnosis not present

## 2019-04-30 MED ORDER — IBUPROFEN 800 MG PO TABS
800.0000 mg | ORAL_TABLET | Freq: Once | ORAL | Status: AC
  Administered 2019-04-30: 16:00:00 800 mg via ORAL
  Filled 2019-04-30: qty 1

## 2019-04-30 NOTE — ED Notes (Signed)
Discharge instructions discussed with Pt. Pt verbalized understanding. Pt stable and ambulatory.    
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
## 2019-04-30 NOTE — Discharge Instructions (Addendum)
Please read attached information. If you experience any new or worsening signs or symptoms please return to the emergency room for evaluation. Please follow-up with your primary care provider or specialist as discussed.  °

## 2019-07-13 ENCOUNTER — Other Ambulatory Visit (HOSPITAL_COMMUNITY): Payer: Self-pay | Admitting: Student

## 2019-07-13 MED FILL — SPIRONOLACTONE 25 MG TABLET: 25 | 30 days supply | Qty: 15 | Fill #2

## 2019-07-13 MED FILL — ENTRESTO 97 MG-103 MG TAB: 97-103 | 30 days supply | Qty: 60 | Fill #2

## 2019-07-27 MED FILL — CARVEDILOL 3.125 MG TABLET: 3.125 | 30 days supply | Qty: 60 | Fill #0

## 2019-08-24 MED FILL — SPIRONOLACTONE 25 MG TABLET: 25 | 30 days supply | Qty: 15 | Fill #3

## 2019-08-31 MED FILL — AZITHROMYCIN 250 MG TABLET: 250 | 5 days supply | Qty: 6 | Fill #0

## 2019-08-31 MED FILL — IBUPROFEN 800 MG TABLET: 800 | 5 days supply | Qty: 20 | Fill #0

## 2019-09-28 MED FILL — SPIRONOLACTONE 25 MG TABLET: 25 | 30 days supply | Qty: 15 | Fill #4

## 2019-11-30 MED FILL — SPIRONOLACTONE 25 MG TABLET: 25 | 30 days supply | Qty: 15 | Fill #5

## 2019-11-30 MED FILL — CARVEDILOL 3.125 MG TABLET: 3.125 | 30 days supply | Qty: 60 | Fill #1

## 2020-02-08 ENCOUNTER — Other Ambulatory Visit (HOSPITAL_COMMUNITY): Payer: Self-pay | Admitting: Internal Medicine

## 2020-02-08 MED ORDER — AMLODIPINE BESYLATE 10 MG PO TABS: 10.0000 mg | ORAL_TABLET | Freq: Every day | ORAL | 11 refills | Status: DC

## 2020-02-08 MED FILL — CARVEDILOL 3.125 MG TABLET: 3.125 | 30 days supply | Qty: 60 | Fill #2

## 2020-02-08 MED FILL — AMLODIPINE BESYLATE 10 MG T: 10 | 30 days supply | Qty: 30 | Fill #0

## 2020-02-08 MED FILL — ENTRESTO 97 MG-103 MG TAB: 97-103 | 30 days supply | Qty: 60 | Fill #0

## 2020-03-29 ENCOUNTER — Other Ambulatory Visit: Payer: Self-pay

## 2020-03-29 ENCOUNTER — Ambulatory Visit (HOSPITAL_COMMUNITY)
Admission: RE | Admit: 2020-03-29 | Discharge: 2020-03-29 | Disposition: A | Discharge status: Home / Self Care | Payer: Medicare Other | Source: Ambulatory Visit | Attending: Internal Medicine | Admitting: Internal Medicine

## 2020-03-29 VITALS — BP 130/72 | HR 78 | Wt 191.0 lb

## 2020-03-29 DIAGNOSIS — Z7982 Long term (current) use of aspirin: Secondary | ICD-10-CM | POA: Insufficient documentation

## 2020-03-29 DIAGNOSIS — I110 Hypertensive heart disease with heart failure: Secondary | ICD-10-CM | POA: Diagnosis not present

## 2020-03-29 DIAGNOSIS — Z833 Family history of diabetes mellitus: Secondary | ICD-10-CM | POA: Insufficient documentation

## 2020-03-29 DIAGNOSIS — I428 Other cardiomyopathies: Secondary | ICD-10-CM | POA: Diagnosis not present

## 2020-03-29 DIAGNOSIS — Z8249 Family history of ischemic heart disease and other diseases of the circulatory system: Secondary | ICD-10-CM | POA: Insufficient documentation

## 2020-03-29 DIAGNOSIS — Z79899 Other long term (current) drug therapy: Secondary | ICD-10-CM | POA: Diagnosis not present

## 2020-03-29 DIAGNOSIS — I5022 Chronic systolic (congestive) heart failure: Secondary | ICD-10-CM | POA: Diagnosis not present

## 2020-03-29 DIAGNOSIS — I10 Essential (primary) hypertension: Secondary | ICD-10-CM

## 2020-03-29 DIAGNOSIS — Z87891 Personal history of nicotine dependence: Secondary | ICD-10-CM | POA: Diagnosis not present

## 2020-03-29 LAB — BASIC METABOLIC PANEL
Anion gap: 10 (ref 5–15)
BUN: 21 mg/dL (ref 8–23)
CO2: 23 mmol/L (ref 22–32)
Calcium: 9.2 mg/dL (ref 8.9–10.3)
Chloride: 108 mmol/L (ref 98–111)
Creatinine, Ser: 1.41 mg/dL — ABNORMAL HIGH (ref 0.61–1.24)
GFR calc Af Amer: >60 mL/min (ref >60)
GFR calc non Af Amer: 53 mL/min — ABNORMAL LOW (ref >60)
Glucose, Bld: 118 mg/dL — ABNORMAL HIGH (ref 70–99)
Potassium: 4.4 mmol/L (ref 3.5–5.1)
Sodium: 141 mmol/L (ref 135–145)

## 2020-03-29 LAB — BRAIN NATRIURETIC PEPTIDE: B Natriuretic Peptide: 616.7 pg/mL — ABNORMAL HIGH (ref 0.0–100.0)

## 2020-03-29 LAB — CBC
HCT: 49 % (ref 39.0–52.0)
Hemoglobin: 16.6 g/dL (ref 13.0–17.0)
MCH: 31.9 pg (ref 26.0–34.0)
MCHC: 33.9 g/dL (ref 30.0–36.0)
MCV: 94 fL (ref 80.0–100.0)
Platelets: 203 10*3/uL (ref 150–400)
RBC: 5.21 MIL/uL (ref 4.22–5.81)
RDW: 12.3 % (ref 11.5–15.5)
WBC: 5.7 10*3/uL (ref 4.0–10.5)
nRBC: 0 % (ref 0.0–0.2)

## 2020-03-29 NOTE — Progress Notes (Signed)
Patient ID: Joel Beck, male   DOB: 03/24/1956, 64 y.o.   MRN: [id number]   Advanced Heart Failure Clinic Note    Date:  03/29/2020   ID:  KESHAWN FIORITO, DOB 11/26/1956, MRN [id number]  PCP:  [name], MD  Cardiologist:   Dr. Johnsie Cancel   History of Present Illness: Joel Beck is a 64 y.o. male with NICM, HTN,  and chronic systolic heart failure diagnosed in 2015.    Today he returns for HF follow up. Here with interpreter. Says he feels great. He did not bring his medications as requested at last visit but says he is taking all meds regularly. Says he feels great. No CP or SOB. No edema, orthopnea or PND. Says he can walk 1 hour without problem.    Cardiac studies:  Echo 07/05/14  EF 25%  Echo 7/17 EF 20-25%  ECHO 05/2017: EF 20-25% Grade II DD Echo 05/2018: EF 20-25%, Grade 1 DD.  Cath by Dr Martinique 2015 with no CAD  Unable to tolerate cMRI due to claustrophobia.   CPX Test 06/2017 Peak VO2: 20.6 (74% predicted peak VO2) VE/VCO2 slope: 28 OUES: 2.55 Peak RER: 0.96 Submax test. Mildly reduced functional capacity. Mild chrontropic incompetence in setting of submax effort. No obvious HF or pulmonary limitation noted.   Review of systems complete and found to be negative unless listed in HPI.   Past Medical History:  Diagnosis Date  . CHF (congestive heart failure) ([place]) 07/05/2014  . Hypertension Dx 2015    Past Surgical History:  Procedure Laterality Date  . LEFT HEART CATHETERIZATION WITH CORONARY ANGIOGRAM N/A 07/05/2014   Procedure: LEFT HEART CATHETERIZATION WITH CORONARY ANGIOGRAM;  Surgeon: Peter M Martinique, MD;  Location: [facility] CATH LAB;  Service: Cardiovascular;  Laterality: N/A;   Current Outpatient Medications  Medication Sig Dispense Refill  . amLODipine (NORVASC) 10 MG tablet Take 1 tablet (10 mg total) by mouth daily. 30 tablet 11  . aspirin EC 81 MG tablet Take 1 tablet (81 mg total) by mouth daily. 90 tablet 3  . carvedilol (COREG) 3.125 MG tablet TAKE 1  TABLET BY MOUTH 2 (TWO) TIMES DAILY. 60 tablet 11  . ENTRESTO 97-103 MG TAKE 1 TABLET BY MOUTH 2 (TWO) TIMES DAILY. 60 tablet 6  . furosemide (LASIX) 40 MG tablet Take 40 mg by mouth daily as needed (edema).    . naproxen sodium (ALEVE) 220 MG tablet Take 220 mg by mouth daily as needed.    Marland Kitchen spironolactone (ALDACTONE) 25 MG tablet Take 0.5 tablets (12.5 mg total) by mouth daily. 15 tablet 6   No current facility-administered medications for this encounter.   Allergies:   Ace inhibitors   Social History:  The patient  reports that he quit smoking about 43 years ago. He has never used smokeless tobacco. He reports current alcohol use of about 1.0 standard drinks of alcohol per week. He reports that he does not use drugs.   Family History:  Father had high blood pressure and diabetes. Mother had no cardiac problems. No other pertinent family history.   Review of systems complete and found to be negative unless listed in HPI.    PHYSICAL EXAM: Vitals:   03/29/20 1117  BP: 130/72  Pulse: 78  SpO2: 98%  Weight: 86.6 kg (191 lb)     Wt Readings from Last 3 Encounters:  03/29/20 86.6 kg (191 lb)  01/08/19 88.7 kg (195 lb 9.6 oz)  09/08/18 85 kg (187 lb 6  oz)    Physical Exam General:  Well appearing. No resp difficulty HEENT: normal Neck: supple. no JVD. Carotids 2+ bilat; no bruits. No lymphadenopathy or thryomegaly appreciated. Cor: PMI nondisplaced. Regular rate & rhythm. No rubs, gallops or murmurs. Lungs: clear Abdomen: soft, nontender, nondistended. No hepatosplenomegaly. No bruits or masses. Good bowel sounds. Extremities: no cyanosis, clubbing, rash, edema Neuro: alert & orientedx3, cranial nerves grossly intact. moves all 4 extremities w/o difficulty. Affect pleasant  ECG: SR 89 with occasional PACs, LVH with repol Personally reviewed    ASSESSMENT AND PLAN:  1.  Chronic systolic HF: Due to NICM. Suspect HTN in nature. Cath without significant CAD 2015. EF has been  down since 2015.  - Echo 05/2018 LVEF 20-25%, Grade 1 DD.  - Reports NYHA I symptoms.  - Volume status stable. He does not require lasix. .  - Continue entresto 97-103 twice a day.  - Continue amlodipine 10 mg daily.  - Continue coreg 3.125 mg BID. Has not tolerated higher doses - Continue 12.5 mg spiro daily.  - Pt intolerant to cMRI with claustrophobia.  - CPX 06/2017 with suboptimal effort.  - Continues to refuse ICD and with NYHA I symptoms wouldn't qualify currently - Will repeat echo. If EF still down will consider SGLT2i  - Has refused Paramedicine support  2. HTN  - Improved after restarting spiro - check labs today    Glori Bickers, MD  11:35 AM

## 2020-03-29 NOTE — Patient Instructions (Signed)
Los laboratorios se Personnel officer, nos comunicaremos con usted para Information systems manager.  Su mdico le ha Clinical cytogeneticist. La ecocardiografa es una prueba indolora que utiliza ondas sonoras para crear imgenes de su corazn. Le brinda a su mdico informacin sobre el tamao y la forma de su corazn y qu tan bien estn funcionando las cmaras y vlvulas de su corazn. Este procedimiento dura aproximadamente una hora. No hay restricciones para este procedimiento.  Llame a nuestra oficina en Januarey 2022 para programar su cita de seguimiento

## 2020-04-07 ENCOUNTER — Ambulatory Visit (HOSPITAL_COMMUNITY): Payer: Medicare Other

## 2020-04-11 ENCOUNTER — Ambulatory Visit (HOSPITAL_COMMUNITY): Admission: RE | Admit: 2020-04-11 | Payer: Medicare Other | Source: Ambulatory Visit

## 2020-04-25 ENCOUNTER — Other Ambulatory Visit: Payer: Self-pay

## 2020-04-25 ENCOUNTER — Ambulatory Visit (HOSPITAL_COMMUNITY)
Admission: RE | Admit: 2020-04-25 | Discharge: 2020-04-25 | Disposition: A | Discharge status: Home / Self Care | Payer: Medicare Other | Source: Ambulatory Visit | Attending: Internal Medicine | Admitting: Internal Medicine

## 2020-04-25 DIAGNOSIS — K219 Gastro-esophageal reflux disease without esophagitis: Secondary | ICD-10-CM | POA: Diagnosis not present

## 2020-04-25 DIAGNOSIS — I5022 Chronic systolic (congestive) heart failure: Secondary | ICD-10-CM | POA: Diagnosis not present

## 2020-04-25 DIAGNOSIS — Z87891 Personal history of nicotine dependence: Secondary | ICD-10-CM | POA: Diagnosis not present

## 2020-04-25 DIAGNOSIS — I110 Hypertensive heart disease with heart failure: Secondary | ICD-10-CM | POA: Diagnosis not present

## 2020-04-25 DIAGNOSIS — I351 Nonrheumatic aortic (valve) insufficiency: Secondary | ICD-10-CM | POA: Insufficient documentation

## 2020-04-25 NOTE — Progress Notes (Signed)
  Echocardiogram 2D Echocardiogram has been performed.  Marybelle Killings 04/25/2020, 2:50 PM

## 2020-07-04 MED FILL — AMLODIPINE BESYLATE 10 MG T: 10 | 30 days supply | Qty: 30 | Fill #1

## 2020-07-04 MED FILL — ENTRESTO 97 MG-103 MG TAB: 97-103 | 30 days supply | Qty: 60 | Fill #1

## 2020-09-20 ENCOUNTER — Emergency Department (HOSPITAL_COMMUNITY): Payer: Medicare Other

## 2020-09-20 ENCOUNTER — Emergency Department (HOSPITAL_COMMUNITY)
Admission: EM | Admit: 2020-09-20 | Discharge: 2020-09-20 | Disposition: A | Discharge status: Home / Self Care | Payer: Medicare Other | Attending: Emergency Medicine | Admitting: Emergency Medicine

## 2020-09-20 ENCOUNTER — Other Ambulatory Visit: Payer: Self-pay

## 2020-09-20 ENCOUNTER — Encounter (HOSPITAL_COMMUNITY): Payer: Self-pay

## 2020-09-20 DIAGNOSIS — I110 Hypertensive heart disease with heart failure: Secondary | ICD-10-CM | POA: Diagnosis not present

## 2020-09-20 DIAGNOSIS — W010XXA Fall on same level from slipping, tripping and stumbling without subsequent striking against object, initial encounter: Secondary | ICD-10-CM | POA: Diagnosis not present

## 2020-09-20 DIAGNOSIS — S63282A Dislocation of proximal interphalangeal joint of right middle finger, initial encounter: Secondary | ICD-10-CM | POA: Insufficient documentation

## 2020-09-20 DIAGNOSIS — Z7982 Long term (current) use of aspirin: Secondary | ICD-10-CM | POA: Diagnosis not present

## 2020-09-20 DIAGNOSIS — S6992XA Unspecified injury of left wrist, hand and finger(s), initial encounter: Secondary | ICD-10-CM | POA: Diagnosis present

## 2020-09-20 DIAGNOSIS — Z87891 Personal history of nicotine dependence: Secondary | ICD-10-CM | POA: Insufficient documentation

## 2020-09-20 DIAGNOSIS — I5022 Chronic systolic (congestive) heart failure: Secondary | ICD-10-CM | POA: Diagnosis not present

## 2020-09-20 DIAGNOSIS — Z79899 Other long term (current) drug therapy: Secondary | ICD-10-CM | POA: Insufficient documentation

## 2020-09-20 DIAGNOSIS — S63259A Unspecified dislocation of unspecified finger, initial encounter: Secondary | ICD-10-CM

## 2020-09-20 MED ORDER — LIDOCAINE HCL (PF) 1 % IJ SOLN
5.0000 mL | Freq: Once | INTRAMUSCULAR | Status: AC
  Administered 2020-09-20: 5 mL
  Filled 2020-09-20: qty 30

## 2020-09-20 MED ORDER — OXYCODONE-ACETAMINOPHEN 5-325 MG PO TABS
1.0000 | ORAL_TABLET | Freq: Once | ORAL | Status: AC
  Administered 2020-09-20: 1 via ORAL
  Filled 2020-09-20: qty 1

## 2020-09-20 NOTE — ED Triage Notes (Signed)
Pt reports fall in shower. Left 3rd digit deformity.

## 2020-09-20 NOTE — ED Provider Notes (Signed)
[facility] DEPT Provider Note   CSN: [id number] Arrival date & time: 09/20/20  2105     History Chief Complaint  Patient presents with  . Finger Injury    Joel Beck is a 64 y.o. male.  HPI   Patient with significant medical history of CHF and hypertension presents to the emergency department with chief complaint of left third digit deformity.  Patient states while he was in the shower he slipped backwards, he put out his hand to brace his fall but his finger was out and jammed it.  He states his finger which turned the wrong way and he try to reduce it himself but was unable to due to pain.  He denies hitting his head, lose conscious, is not on anticoags.  Patient has not taking any pain meds for his finger.  He states moving it makes the pain worse and describes the pain as a dull throbbing sensation.  He denies any alleviating factors.  Patient denies headache, fever, chills, shortness of breath, chest pain, dumping, nausea, vomiting, diarrhea.  Past Medical History:  Diagnosis Date  . CHF (congestive heart failure) ([place]) 07/05/2014  . Hypertension Dx 2015    Patient Active Problem List   Diagnosis Date Noted  . Hypertension   . Elevated serum creatinine 07/20/2015  . Hypertensive heart disease with congestive heart failure ([place]) 06/29/2015  . Onychomycosis 09/02/2014  . Chronic systolic CHF (congestive heart failure) ([place]) 07/05/2014  . CHF (congestive heart failure) ([place]) 07/05/2014  . Accelerated hypertension 07/04/2014  . GERD (gastroesophageal reflux disease) 07/04/2014    Past Surgical History:  Procedure Laterality Date  . LEFT HEART CATHETERIZATION WITH CORONARY ANGIOGRAM N/A 07/05/2014   Procedure: LEFT HEART CATHETERIZATION WITH CORONARY ANGIOGRAM;  Surgeon: Peter M Martinique, MD;  Location: [facility] CATH LAB;  Service: Cardiovascular;  Laterality: N/A;       Family History  Problem Relation Age of Onset  . Cancer Neg Hx   . Heart  disease Neg Hx     Social History   Tobacco Use  . Smoking status: Former Smoker    Quit date: 12/03/1976    Years since quitting: 43.8  . Smokeless tobacco: Never Used  Substance Use Topics  . Alcohol use: Yes    Alcohol/week: 1.0 standard drink    Types: 1 Cans of beer per week    Comment: occ  . Drug use: No    Home Medications Prior to Admission medications   Medication Sig Start Date End Date Taking? Authorizing Provider  amLODipine (NORVASC) 10 MG tablet Take 1 tablet (10 mg total) by mouth daily. 02/08/20 02/07/21  Bensimhon, Shaune Pascal, MD  aspirin EC 81 MG tablet Take 1 tablet (81 mg total) by mouth daily. 03/15/16   Funches, Adriana Mccallum, MD  carvedilol (COREG) 3.125 MG tablet TAKE 1 TABLET BY MOUTH 2 (TWO) TIMES DAILY. 07/13/19   Bensimhon, Shaune Pascal, MD  ENTRESTO 97-103 MG TAKE 1 TABLET BY MOUTH 2 (TWO) TIMES DAILY. 02/08/20   Bensimhon, Shaune Pascal, MD  furosemide (LASIX) 40 MG tablet Take 40 mg by mouth daily as needed (edema).    [provider]  naproxen sodium (ALEVE) 220 MG tablet Take 220 mg by mouth daily as needed.    [provider]  spironolactone (ALDACTONE) 25 MG tablet Take 0.5 tablets (12.5 mg total) by mouth daily. 01/08/19   Darrick Grinder D, NP    Allergies    Ace inhibitors  Review of Systems  Review of Systems  Constitutional: Negative for chills and fever.  HENT: Negative for congestion, tinnitus, trouble swallowing and voice change.   Eyes: Negative for visual disturbance.  Respiratory: Negative for shortness of breath.   Cardiovascular: Negative for chest pain.  Gastrointestinal: Negative for abdominal pain, diarrhea, nausea and vomiting.  Genitourinary: Negative for enuresis, flank pain and frequency.  Musculoskeletal: Negative for back pain.       Left finger pain.  Skin: Negative for rash.  Neurological: Negative for dizziness and light-headedness.  Hematological: Does not bruise/bleed easily.    Physical Exam Updated Vital Signs BP (!)  152/111   Pulse 95   Temp 99 F (37.2 C) (Oral)   Resp 16   Ht 6' (1.829 m)   Wt 86.2 kg   SpO2 99%   BMI 25.77 kg/m   Physical Exam Vitals and nursing note reviewed.  Constitutional:      General: He is not in acute distress.    Appearance: Normal appearance. He is not ill-appearing or diaphoretic.  HENT:     Head: Normocephalic and atraumatic.     Nose: No congestion or rhinorrhea.  Eyes:     General: No scleral icterus.       Right eye: No discharge.        Left eye: No discharge.     Conjunctiva/sclera: Conjunctivae normal.  Pulmonary:     Effort: Pulmonary effort is normal. No respiratory distress.     Breath sounds: Normal breath sounds. No wheezing.  Musculoskeletal:        General: Tenderness, deformity and signs of injury present. No swelling.     Cervical back: Neck supple.     Right lower leg: No edema.     Left lower leg: No edema.     Comments: Patient's left hand was visualized, third digit had a deformity noted at the middle joint.  He had dorsal and ulnar dislocation of the third PIP joint.,  He was unable to flex or extend his finger, neurovascular fully intact.  Skin:    General: Skin is warm and dry.     Coloration: Skin is not jaundiced or pale.  Neurological:     Mental Status: He is alert and oriented to person, place, and time.  Psychiatric:        Mood and Affect: Mood normal.     ED Results / Procedures / Treatments   Labs (all labs ordered are listed, but only abnormal results are displayed) Labs Reviewed - No data to display  EKG None  Radiology DG Finger Middle Left  Result Date: 09/20/2020 CLINICAL DATA:  Postreduction EXAM: LEFT MIDDLE FINGER 2+V COMPARISON:  09/20/2020 FINDINGS: Anatomic alignment of the left third proximal interphalangeal joint postreduction. No fractures identified. Soft tissue swelling is present. IMPRESSION: Anatomic alignment of the left third proximal interphalangeal joint postreduction. Electronically Signed    By: Lucienne Capers M.D.   On: 09/20/2020 22:43   DG Finger Middle Left  Result Date: 09/20/2020 CLINICAL DATA:  [place] in shower with deformity EXAM: LEFT MIDDLE FINGER 2+V COMPARISON:  None. FINDINGS: Dorsal and ulnar dislocation of the base of the middle phalanx of the third digit with respect to the head of the proximal phalanx. No definite fracture is seen IMPRESSION: Dorsal and ulnar dislocation at the third PIP joint. Electronically Signed   By: Donavan Foil M.D.   On: 09/20/2020 21:35    Procedures Reduction of dislocation  Date/Time: 09/20/2020 10:51 PM Performed by: Deno Etienne  J, PA-C Authorized by: Marcello Fennel, PA-C  Preparation: Patient was prepped and draped in the usual sterile fashion. Local anesthesia used: yes Anesthesia: digital block  Anesthesia: Local anesthesia used: yes Local Anesthetic: lidocaine 1% without epinephrine Anesthetic total: 2 mL  Sedation: Patient sedated: no  Patient tolerance: patient tolerated the procedure well with no immediate complications Comments: Patient had full range of motion at the proximal, middle, distal joints, neurovascular fully intact.      (including critical care time)  Medications Ordered in ED Medications  lidocaine (PF) (XYLOCAINE) 1 % injection 5 mL (has no administration in time range)  oxyCODONE-acetaminophen (PERCOCET/ROXICET) 5-325 MG per tablet 1 tablet (has no administration in time range)  oxyCODONE-acetaminophen (PERCOCET/ROXICET) 5-325 MG per tablet 1 tablet (1 tablet Oral Given 09/20/20 2157)    ED Course  I have reviewed the triage vital signs and the nursing notes.  Pertinent labs & imaging results that were available during my care of the patient were reviewed by me and considered in my medical decision making (see chart for details).    MDM Rules/Calculators/A&P                          I have personally reviewed all imaging, labs and have interpreted them.  Patient presents  with left third digit dislocation. He is alert, did not appear in acute distress, vital signs reassuring. Will order x-ray for further evaluation.  X-ray shows dorsal and ulnar dislocation of the third PIP joint. Patient's finger will have to be reduced, will provide him pain meds and perform a digital block.  Patient's finger was successfully reduced.  X-ray was obtained shows reduced dislocation without acute findings.  Neurovascular fully intact.  Low suspicion for fracture as x-ray negative for acute fractures. Low suspicion for compartment syndrome as area was palpated and was soft to the touch, neurovascular fully intact. low suspicion for ligament or tendon damage as area was palpated no gross defects noted, he full range of motion after reduction, neurovascular fully intact. Patient suffered a dislocation at the PIP joint successfully reduced. Will place patient in splint and have him follow-up with hand for further evaluation management.  Vital signs have remained stable, no indication for hospital admission.  Patient discussed with attending and they agreed with assessment and plan.  Patient given at home care as well strict return precautions.  Patient verbalized that they understood agreed to said plan.   Final Clinical Impression(s) / ED Diagnoses Final diagnoses:  Dislocation of finger, initial encounter    Rx / DC Orders ED Discharge Orders    None       Marcello Fennel, PA-C 09/20/20 2254    Lucrezia Starch, MD 09/21/20 [phone]

## 2020-09-20 NOTE — Discharge Instructions (Addendum)
I have reduced your finger and place it in a splint.  Please keep on for 1 week time.  I recommend taking over-the-counter pain medication like ibuprofen or Tylenol every 6 hours as needed please follow dosing back of bottle. I recommend applying ice to the area as this will help decrease inflammation and swelling.  Important that you follow-up with hand surgery as you will need further evaluation management.  Come back to the emergency department if you develop chest pain, shortness of breath, severe abdominal pain, uncontrolled nausea, vomiting, diarrhea.

## 2020-09-20 NOTE — Progress Notes (Signed)
Orthopedic Tech Progress Note Patient Details:  Joel Beck May 17, 1956 [id number]  Ortho Devices Type of Ortho Device: Finger splint Ortho Device/Splint Location: LUE Ortho Device/Splint Interventions: Ordered, Application   Post Interventions Patient Tolerated: Well Instructions Provided: Care of device   Braulio Bosch 09/20/2020, 10:43 PM

## 2020-12-07 ENCOUNTER — Other Ambulatory Visit (HOSPITAL_COMMUNITY): Payer: Self-pay | Admitting: Internal Medicine

## 2020-12-07 MED FILL — AMLODIPINE BESYLATE 10 MG T: 10 | 30 days supply | Qty: 30 | Fill #2

## 2020-12-08 MED FILL — CARVEDILOL 3.125 MG TABLET: 3.125 | 30 days supply | Qty: 60 | Fill #0

## 2021-02-09 ENCOUNTER — Other Ambulatory Visit (HOSPITAL_COMMUNITY): Payer: Self-pay | Admitting: Internal Medicine

## 2021-02-09 MED FILL — ENTRESTO 97 MG-103 MG TAB: 97-103 | 30 days supply | Qty: 60 | Fill #0

## 2021-02-09 MED FILL — CARVEDILOL 3.125 MG TABLET: 3.125 | 30 days supply | Qty: 60 | Fill #0

## 2021-04-03 ENCOUNTER — Inpatient Hospital Stay (HOSPITAL_COMMUNITY): Admission: RE | Admit: 2021-04-03 | Payer: Medicare Other | Source: Ambulatory Visit | Admitting: Internal Medicine

## 2021-04-26 ENCOUNTER — Other Ambulatory Visit: Payer: Self-pay

## 2021-04-26 ENCOUNTER — Other Ambulatory Visit (HOSPITAL_COMMUNITY): Payer: Self-pay | Admitting: *Deleted

## 2021-04-26 ENCOUNTER — Ambulatory Visit (HOSPITAL_COMMUNITY)
Admission: RE | Admit: 2021-04-26 | Discharge: 2021-04-26 | Disposition: A | Discharge status: Home / Self Care | Payer: Medicare (Managed Care) | Source: Ambulatory Visit | Attending: Internal Medicine | Admitting: Internal Medicine

## 2021-04-26 ENCOUNTER — Other Ambulatory Visit (HOSPITAL_COMMUNITY): Payer: Self-pay | Admitting: Internal Medicine

## 2021-04-26 DIAGNOSIS — I5022 Chronic systolic (congestive) heart failure: Secondary | ICD-10-CM | POA: Insufficient documentation

## 2021-04-26 LAB — CBC
HCT: 45.1 % (ref 39.0–52.0)
Hemoglobin: 15.3 g/dL (ref 13.0–17.0)
MCH: 32.1 pg (ref 26.0–34.0)
MCHC: 33.9 g/dL (ref 30.0–36.0)
MCV: 94.5 fL (ref 80.0–100.0)
Platelets: 160 10*3/uL (ref 150–400)
RBC: 4.77 MIL/uL (ref 4.22–5.81)
RDW: 12.3 % (ref 11.5–15.5)
WBC: 6.2 10*3/uL (ref 4.0–10.5)
nRBC: 0 % (ref 0.0–0.2)

## 2021-04-26 LAB — BASIC METABOLIC PANEL
Anion gap: 7 (ref 5–15)
BUN: 17 mg/dL (ref 8–23)
CO2: 27 mmol/L (ref 22–32)
Calcium: 8.8 mg/dL — ABNORMAL LOW (ref 8.9–10.3)
Chloride: 104 mmol/L (ref 98–111)
Creatinine, Ser: 1.33 mg/dL — ABNORMAL HIGH (ref 0.61–1.24)
GFR, Estimated: >60 mL/min (ref >60)
Glucose, Bld: 100 mg/dL — ABNORMAL HIGH (ref 70–99)
Potassium: 4.5 mmol/L (ref 3.5–5.1)
Sodium: 138 mmol/L (ref 135–145)

## 2021-04-26 LAB — BRAIN NATRIURETIC PEPTIDE: B Natriuretic Peptide: 2789.6 pg/mL — ABNORMAL HIGH (ref 0.0–100.0)

## 2021-04-26 MED ORDER — AMLODIPINE BESYLATE 10 MG PO TABS
ORAL_TABLET | Freq: Every day | ORAL | 11 refills | Status: DC
  Filled 2021-04-26: qty 30, 30d supply, fill #0

## 2021-04-29 ENCOUNTER — Other Ambulatory Visit: Payer: Self-pay

## 2021-04-29 ENCOUNTER — Observation Stay (HOSPITAL_COMMUNITY)
Admission: EM | Admit: 2021-04-29 | Discharge: 2021-04-30 | Disposition: A | Discharge status: Home / Self Care | Payer: Medicare (Managed Care) | Attending: Family Medicine | Admitting: Family Medicine

## 2021-04-29 ENCOUNTER — Emergency Department (HOSPITAL_COMMUNITY): Payer: Medicare (Managed Care)

## 2021-04-29 ENCOUNTER — Encounter (HOSPITAL_COMMUNITY): Payer: Self-pay | Admitting: Emergency Medicine

## 2021-04-29 DIAGNOSIS — Z79899 Other long term (current) drug therapy: Secondary | ICD-10-CM | POA: Insufficient documentation

## 2021-04-29 DIAGNOSIS — I5033 Acute on chronic diastolic (congestive) heart failure: Principal | ICD-10-CM | POA: Insufficient documentation

## 2021-04-29 DIAGNOSIS — I110 Hypertensive heart disease with heart failure: Secondary | ICD-10-CM | POA: Diagnosis not present

## 2021-04-29 DIAGNOSIS — N189 Chronic kidney disease, unspecified: Secondary | ICD-10-CM | POA: Diagnosis present

## 2021-04-29 DIAGNOSIS — I5023 Acute on chronic systolic (congestive) heart failure: Secondary | ICD-10-CM | POA: Diagnosis not present

## 2021-04-29 DIAGNOSIS — Z20822 Contact with and (suspected) exposure to covid-19: Secondary | ICD-10-CM | POA: Diagnosis not present

## 2021-04-29 DIAGNOSIS — Z87891 Personal history of nicotine dependence: Secondary | ICD-10-CM | POA: Diagnosis not present

## 2021-04-29 DIAGNOSIS — Z7982 Long term (current) use of aspirin: Secondary | ICD-10-CM | POA: Insufficient documentation

## 2021-04-29 DIAGNOSIS — I509 Heart failure, unspecified: Secondary | ICD-10-CM

## 2021-04-29 DIAGNOSIS — R9431 Abnormal electrocardiogram [ECG] [EKG]: Secondary | ICD-10-CM | POA: Insufficient documentation

## 2021-04-29 DIAGNOSIS — R0602 Shortness of breath: Secondary | ICD-10-CM | POA: Diagnosis present

## 2021-04-29 DIAGNOSIS — R778 Other specified abnormalities of plasma proteins: Secondary | ICD-10-CM | POA: Diagnosis present

## 2021-04-29 DIAGNOSIS — K219 Gastro-esophageal reflux disease without esophagitis: Secondary | ICD-10-CM | POA: Insufficient documentation

## 2021-04-29 DIAGNOSIS — I10 Essential (primary) hypertension: Secondary | ICD-10-CM | POA: Diagnosis present

## 2021-04-29 DIAGNOSIS — R7989 Other specified abnormal findings of blood chemistry: Secondary | ICD-10-CM | POA: Diagnosis present

## 2021-04-29 LAB — CBC WITH DIFFERENTIAL/PLATELET
Abs Immature Granulocytes: 0.02 10*3/uL (ref 0.00–0.07)
Basophils Absolute: 0 10*3/uL (ref 0.0–0.1)
Basophils Relative: 0 %
Eosinophils Absolute: 0.1 10*3/uL (ref 0.0–0.5)
Eosinophils Relative: 2 %
HCT: 42.4 % (ref 39.0–52.0)
Hemoglobin: 14.4 g/dL (ref 13.0–17.0)
Immature Granulocytes: 0 %
Lymphocytes Relative: 15 %
Lymphs Abs: 1 10*3/uL (ref 0.7–4.0)
MCH: 32.4 pg (ref 26.0–34.0)
MCHC: 34 g/dL (ref 30.0–36.0)
MCV: 95.3 fL (ref 80.0–100.0)
Monocytes Absolute: 0.5 10*3/uL (ref 0.1–1.0)
Monocytes Relative: 8 %
Neutro Abs: 4.9 10*3/uL (ref 1.7–7.7)
Neutrophils Relative %: 75 %
Platelets: 174 10*3/uL (ref 150–400)
RBC: 4.45 MIL/uL (ref 4.22–5.81)
RDW: 12.3 % (ref 11.5–15.5)
WBC: 6.5 10*3/uL (ref 4.0–10.5)
nRBC: 0 % (ref 0.0–0.2)

## 2021-04-29 LAB — BRAIN NATRIURETIC PEPTIDE: B Natriuretic Peptide: 3250.7 pg/mL — ABNORMAL HIGH (ref 0.0–100.0)

## 2021-04-29 LAB — BASIC METABOLIC PANEL
Anion gap: 6 (ref 5–15)
BUN: 20 mg/dL (ref 8–23)
CO2: 27 mmol/L (ref 22–32)
Calcium: 9.3 mg/dL (ref 8.9–10.3)
Chloride: 107 mmol/L (ref 98–111)
Creatinine, Ser: 1.25 mg/dL — ABNORMAL HIGH (ref 0.61–1.24)
GFR, Estimated: >60 mL/min (ref >60)
Glucose, Bld: 100 mg/dL — ABNORMAL HIGH (ref 70–99)
Potassium: 4.4 mmol/L (ref 3.5–5.1)
Sodium: 140 mmol/L (ref 135–145)

## 2021-04-29 LAB — TROPONIN I (HIGH SENSITIVITY)
Troponin I (High Sensitivity): 35 ng/L — ABNORMAL HIGH (ref <18)
Troponin I (High Sensitivity): 32 ng/L — ABNORMAL HIGH (ref <18)

## 2021-04-29 LAB — RESP PANEL BY RT-PCR (FLU A&B, COVID) ARPGX2
Influenza A by PCR: NEGATIVE
Influenza B by PCR: NEGATIVE
SARS Coronavirus 2 by RT PCR: NEGATIVE

## 2021-04-29 LAB — MAGNESIUM: Magnesium: 2 mg/dL (ref 1.7–2.4)

## 2021-04-29 LAB — HIV ANTIBODY (ROUTINE TESTING W REFLEX): HIV Screen 4th Generation wRfx: NONREACTIVE

## 2021-04-29 MED ORDER — SACUBITRIL-VALSARTAN 97-103 MG PO TABS
1.0000 | ORAL_TABLET | Freq: Two times a day (BID) | ORAL | Status: DC
  Administered 2021-04-29 – 2021-04-30 (×3): 1 via ORAL
  Filled 2021-04-29 (×5): qty 1

## 2021-04-29 MED ORDER — METOPROLOL SUCCINATE ER 25 MG PO TB24
12.5000 mg | ORAL_TABLET | Freq: Every day | ORAL | Status: DC
  Filled 2021-04-29: qty 1

## 2021-04-29 MED ORDER — SPIRONOLACTONE 12.5 MG HALF TABLET
12.5000 mg | ORAL_TABLET | Freq: Every day | ORAL | Status: DC
  Filled 2021-04-29: qty 1

## 2021-04-29 MED ORDER — FUROSEMIDE 10 MG/ML IJ SOLN
40.0000 mg | Freq: Once | INTRAMUSCULAR | Status: AC
  Administered 2021-04-29: 40 mg via INTRAVENOUS
  Filled 2021-04-29: qty 4

## 2021-04-29 MED ORDER — CARVEDILOL 3.125 MG PO TABS
3.1250 mg | ORAL_TABLET | Freq: Two times a day (BID) | ORAL | Status: DC
  Administered 2021-04-29 – 2021-04-30 (×2): 3.125 mg via ORAL
  Filled 2021-04-29 (×2): qty 1

## 2021-04-29 MED ORDER — POLYETHYLENE GLYCOL 3350 17 G PO PACK: 17.0000 g | PACK | Freq: Every day | ORAL | Status: DC | PRN

## 2021-04-29 MED ORDER — ENOXAPARIN SODIUM 30 MG/0.3ML IJ SOSY
30.0000 mg | PREFILLED_SYRINGE | INTRAMUSCULAR | Status: DC
  Administered 2021-04-29: 30 mg via SUBCUTANEOUS
  Filled 2021-04-29: qty 0.3

## 2021-04-29 NOTE — ED Provider Notes (Signed)
[facility] EMERGENCY DEPARTMENT Provider Note   CSN: [id number] Arrival date & time: 04/29/21  [id number]     History No chief complaint on file.   Joel DELANUEZ is a 65 y.o. male.  HPI   65 year old male with past medical history of HTN, CHF on Lasix presents emergency department with worsening orthopnea and weakness.  Patient states for the past couple weeks he has been declining, his symptoms are worse at night.  Intermittent wheezing.  He has been having chest tightness but denies any fever or productive cough.  Mild swelling of his bilateral lower extremities.  Denies any GI symptoms.  States that he has been compliant with his medications.  Past Medical History:  Diagnosis Date  . CHF (congestive heart failure) ([place]) 07/05/2014  . Hypertension Dx 2015    Patient Active Problem List   Diagnosis Date Noted  . Hypertension   . Elevated serum creatinine 07/20/2015  . Hypertensive heart disease with congestive heart failure ([place]) 06/29/2015  . Onychomycosis 09/02/2014  . Chronic systolic CHF (congestive heart failure) ([place]) 07/05/2014  . CHF (congestive heart failure) ([place]) 07/05/2014  . Accelerated hypertension 07/04/2014  . GERD (gastroesophageal reflux disease) 07/04/2014    Past Surgical History:  Procedure Laterality Date  . LEFT HEART CATHETERIZATION WITH CORONARY ANGIOGRAM N/A 07/05/2014   Procedure: LEFT HEART CATHETERIZATION WITH CORONARY ANGIOGRAM;  Surgeon: Peter M Martinique, MD;  Location: [facility] CATH LAB;  Service: Cardiovascular;  Laterality: N/A;       Family History  Problem Relation Age of Onset  . Cancer Neg Hx   . Heart disease Neg Hx     Social History   Tobacco Use  . Smoking status: Former Smoker    Quit date: 12/03/1976    Years since quitting: 44.4  . Smokeless tobacco: Never Used  Substance Use Topics  . Alcohol use: Yes    Alcohol/week: 1.0 standard drink    Types: 1 Cans of beer per week    Comment: occ  . Drug use: No     Home Medications Prior to Admission medications   Medication Sig Start Date End Date Taking? Authorizing Provider  amLODipine (NORVASC) 10 MG tablet TAKE 1 TABLET (10 MG TOTAL) BY MOUTH DAILY. 04/26/21 04/26/22  Bensimhon, Shaune Pascal, MD  aspirin EC 81 MG tablet Take 1 tablet (81 mg total) by mouth daily. 03/15/16   Funches, Adriana Mccallum, MD  carvedilol (COREG) 3.125 MG tablet TAKE 1 TABLET BY MOUTH 2 (TWO) TIMES DAILY. 12/07/20 12/07/21  Bensimhon, Shaune Pascal, MD  furosemide (LASIX) 40 MG tablet Take 40 mg by mouth daily as needed (edema).    [provider]  naproxen sodium (ALEVE) 220 MG tablet Take 220 mg by mouth daily as needed.    [provider]  sacubitril-valsartan (ENTRESTO) 97-103 MG TAKE 1 TABLET BY MOUTH 2 (TWO) TIMES DAILY. MUST KEEP PENDING APPOINTMENT FOR FURTHER REFILLS 02/09/21 02/09/22  Bensimhon, Shaune Pascal, MD  spironolactone (ALDACTONE) 25 MG tablet Take 0.5 tablets (12.5 mg total) by mouth daily. 01/08/19   Clegg, Amy D, NP    Allergies    Ace inhibitors  Review of Systems   Review of Systems  Constitutional: Positive for appetite change and fatigue. Negative for chills and fever.  HENT: Negative for congestion.   Eyes: Negative for visual disturbance.  Respiratory: Positive for chest tightness and shortness of breath.   Cardiovascular: Positive for leg swelling. Negative for chest pain.  Gastrointestinal: Negative for abdominal pain, diarrhea and  vomiting.  Genitourinary: Negative for dysuria.  Skin: Negative for rash.  Neurological: Negative for headaches.    Physical Exam Updated Vital Signs BP (!) 153/119   Pulse 87   Temp 97.9 F (36.6 C)   Resp (!) 23   SpO2 97%   Physical Exam Vitals and nursing note reviewed.  Constitutional:      General: He is not in acute distress.    Appearance: Normal appearance. He is not diaphoretic.  HENT:     Head: Normocephalic.     Mouth/Throat:     Mouth: Mucous membranes are moist.  Eyes:     Pupils: Pupils  are equal, round, and reactive to light.  Cardiovascular:     Rate and Rhythm: Normal rate.  Pulmonary:     Effort: Pulmonary effort is normal. No respiratory distress.     Breath sounds: Rales present.  Abdominal:     Palpations: Abdomen is soft.     Tenderness: There is no abdominal tenderness.  Musculoskeletal:        General: Swelling present.  Skin:    General: Skin is warm.  Neurological:     Mental Status: He is alert and oriented to person, place, and time. Mental status is at baseline.  Psychiatric:        Mood and Affect: Mood normal.     ED Results / Procedures / Treatments   Labs (all labs ordered are listed, but only abnormal results are displayed) Labs Reviewed  BASIC METABOLIC PANEL - Abnormal; Notable for the following components:      Result Value   Glucose, Bld 100 (*)    Creatinine, Ser 1.25 (*)    All other components within normal limits  BRAIN NATRIURETIC PEPTIDE - Abnormal; Notable for the following components:   B Natriuretic Peptide 3,250.7 (*)    All other components within normal limits  TROPONIN I (HIGH SENSITIVITY) - Abnormal; Notable for the following components:   Troponin I (High Sensitivity) 35 (*)    All other components within normal limits  CBC WITH DIFFERENTIAL/PLATELET  TROPONIN I (HIGH SENSITIVITY)    EKG EKG Interpretation  Date/Time:  Saturday Apr 29 2021 07:28:51 EDT Ventricular Rate:  97 PR Interval:  178 QRS Duration: 108 QT Interval:  350 QTC Calculation: 444 R Axis:   72 Text Interpretation: Sinus rhythm with occasional Premature ventricular complexes Possible Left atrial enlargement Incomplete left bundle branch block Left ventricular hypertrophy with repolarization abnormality ( Cornell product ) Abnormal ECG Similar to previous Confirmed by Lavenia Atlas [phone]) on 04/29/2021 9:30:20 AM   Radiology DG Chest 2 View  Result Date: 04/29/2021 CLINICAL DATA:  Shortness of breath and chest pain EXAM: CHEST - 2 VIEW  COMPARISON:  01/14/2016 FINDINGS: Cardiomegaly. Hilar vascular fullness. Diffuse interstitial opacity with Kerley lines and fissure thickening. IMPRESSION: CHF. Electronically Signed   By: Monte Fantasia M.D.   On: 04/29/2021 08:09    Procedures Procedures   Medications Ordered in ED Medications  furosemide (LASIX) injection 40 mg (40 mg Intravenous Given 04/29/21 1105)    ED Course  I have reviewed the triage vital signs and the nursing notes.  Pertinent labs & imaging results that were available during my care of the patient were reviewed by me and considered in my medical decision making (see chart for details).    MDM Rules/Calculators/A&P  65 year old male presents the emergency department with concern for worsening shortness of breath, weakness and orthopnea.  He is tachypneic at times, conversationally dyspneic.  Scattered rales at the lung bases, mild edema of the lower extremities.  Blood work shows a high BNP with slightly elevated troponin.  EKG is unchanged for him, no active chest pain.  Chest x-ray shows CHF findings.  Patient will require admission for diuresis and further evaluation.  Patients evaluation and results requires admission for further treatment and care. Patient agrees with admission plan, offers no new complaints and is stable/unchanged at time of admit.  Final Clinical Impression(s) / ED Diagnoses Final diagnoses:  None    Rx / DC Orders ED Discharge Orders    None       Lorelle Gibbs, DO 04/29/21 1135

## 2021-04-29 NOTE — Progress Notes (Addendum)
FMTS Attending Brief Admit Note: Joel Singh, MD   For questions about this patient, please use [email or web address] to page the family medicine resident on call. Pager number [phone].    I  have seen and examined this patient, reviewed their chart. I have discussed this patient with the resident. Will sign resident note as available.  65 year old gentleman with history of heart failure with reduced ejection fraction (systolic failure), hypertension, and dyslipidemia presenting with dyspnea on exertion. Reports he typically can work out but recently has noted 2 pillow orthopnea, paroxysmal nocturnal dyspnea, intermittent chest tightness, and feeling fatigued. He has not been taking his medications. No fevers, cough, nausea, vomiting, or other new symptoms.   PMH-  Heart failure, patient surprised to learn his last EF was 25 (Has been present since 2015), last had  Cardiac catheterization in 2015 (no obstructive coronary disease), HTN  PSH, Family and Social history reviewed. Lives with wife of 88 years. Was bread maker.  Labs Reviewed  EKG- SR with atrial enlargement, LAD and LVH   X-ray notable for enlarged cardiac silhouette, small L effusion, cephalization of vessels  Exam JVP to high neck at 30 degrees RRR with soft systolic murmur at LLSB Lungs coarse bilaterally with rales at bases 1+ LE edema  Abdomen is soft  1. Acute on chronic systolic heart failure, likely cause of symptoms, suspect etiology of exacerbation is nonadherence. Troponin relatively flat. Has been taking NSAIDs, which could also be trigger. Will repeat echocardiogram, diurese, optimize medications and ask Cardiology to weigh in with recommendations. Will restart low dose beta-blocker as he is perfusing and hypertensive.  2. Remainder per resident note to follow.

## 2021-04-29 NOTE — H&P (Addendum)
[facility] Admission History and Physical Service Pager: [phone]  Patient name: Joel Beck Medical record number: [id number] Date of birth: Dec 29, 1955 Age: 65 y.o. Gender: male  Primary Care Provider: [name], MD Consultants: None Code Status: FULL Preferred Emergency Contact:  Contact Information    Name Relation Home [place] Spouse   [phone]      Chief Complaint: Shortness of breath  Assessment and Plan: Joel Beck is a 65 y.o. male presenting with shortness of breath consistent with acute on chronic heart failure exacerbation. PMH is significant for HTN  Shortness of breath 2/2 Acute on Chronic HFrEF Worsening shortness of breath x5 days. ED work up reveals elevated BNP to 3,250, Troponin 35>32. No chest pain currently, though reported chest pain yesterday that resolved with Aleve.  His EKG is stable from last month.  Suspect demand ischemia in the setting of acute HFrEF exacerbation.  In the ED, BNP 3,250 which is increased from 3 days ago when it was 2,789.6. CXR consistent with fluid overload.  Has run out of his diuretics in the last few days, unable to specify to me which ones he was on. Last echo 04/25/20 showed EF 20-25%. Stable kidney function and electrolytes, will monitor closely with diuresis. Appears that he was last seen by Dr. [name] on 03/29/20 for f/u on his HFrEF, medications at that time amlodipine 10 mg daily, coreg 3.124 mg BID (has not tolerated higher doses), spironolactone 12.5 mg daily.  Other differentials to consider are pneumonia, patient does endorse cough but WBC is normal, chest x-ray without concerning opacities and afebrile.  Must always consider PE in the setting of shortness of breath, though I doubt as he is not tachycardic, no risk factors or leg swelling and Wells score 0.  Will admit for further diuresis and evaluation of what may have caused his acute exacerbation.  Feel likely in  the setting of missed medications and hypertension.  -Admit to [facility] med-tele, attending Dr. Owens Shark -Cardiology consult tomorrow  -Vitals per floor -Strict I/O, daily weights -s/p IV Lasix 40 mg x1 -Additional dose of IV Lasix 40 mg x1 this evening -Lasix 40 mg tomorrow; can increase pending BMP/diuresis -Continue home carvedilol 3.125 mg BID   -Continue home Entresto 97-103 mg BID -Echo -PT/OT eval and treat  -Incentive spirometer -AM BMP -Lipid panel  Hypertension: Chronic Hypertensive in ED ranging 143-157/108-132, most recently 132/24. Home medications include: coreg 3.125 mg BID, entresto 97-103 mg BID.  Reports compliance with this daily. -Vitals per floor -Continue home carvedilol and Entresto -We will hold on adding additional hypertensives as we are diuresing  EKG abnormalities: stable With PVC's, LVH (likely from ongoing hypertension) and inverted T-waves in leads II, III and aVF, though appear to be stable from prior.  -Will add on Mg -Monitor with telemetry -Monitor electrolytes and replete as needed  GERD: chronic, stable Problem noted on chart review. Not on any home medications. Patient denies symptoms.  -Monitor for now.   FEN/GI: Heart healthy diet Prophylaxis: Lovenox  Disposition: Med-tele  History of Present Illness:  Joel Beck is a 65 y.o. male presenting with difficulty breathing for the last 5 days.  States that his breathing has been getting worse. Can't lay flat, uses 2 pillows. Doesn't sleep well.   Takes carvedilol 3.125 mng, BID, Entresto 97-103 mg, ASA. Has not had Lasix for the last 3 days because he ran out. States it is at the pharmacy. He is  not completely sure if he supposed to be on other medications, reports some of them were stopped.  Reports next appointment with Cardiology is in July. Has not seen cardiologist in over a year. Has missed two appointments because he forgot.   No chest pain though yesterday he had chest pain but it  didn't last long. States he took Alleve for it and it went away.   Coughing now, has phlegm, ongoing for last 4 days.  No fever, denies any swelling.   Review Of Systems: Per HPI with the following additions:  Review of Systems  Constitutional: Negative for chills, diaphoresis and fever.  HENT: Negative for sneezing and sore throat.   Respiratory: Positive for cough, shortness of breath and wheezing. Negative for chest tightness.   Cardiovascular: Negative for chest pain and leg swelling.  Gastrointestinal: Negative for abdominal pain, constipation, diarrhea, nausea and vomiting.  Genitourinary: Negative for difficulty urinating, dysuria and hematuria.  Neurological: Positive for weakness. Negative for dizziness, light-headedness and headaches.       Feels weak when walking.     Patient Active Problem List   Diagnosis Date Noted  . Hypertension   . Elevated serum creatinine 07/20/2015  . Hypertensive heart disease with congestive heart failure ([place]) 06/29/2015  . Onychomycosis 09/02/2014  . Chronic systolic CHF (congestive heart failure) ([place]) 07/05/2014  . CHF (congestive heart failure) ([place]) 07/05/2014  . Accelerated hypertension 07/04/2014  . GERD (gastroesophageal reflux disease) 07/04/2014    Past Medical History: Past Medical History:  Diagnosis Date  . CHF (congestive heart failure) ([place]) 07/05/2014  . Hypertension Dx 2015    Past Surgical History: Past Surgical History:  Procedure Laterality Date  . LEFT HEART CATHETERIZATION WITH CORONARY ANGIOGRAM N/A 07/05/2014   Procedure: LEFT HEART CATHETERIZATION WITH CORONARY ANGIOGRAM;  Surgeon: Peter M Martinique, MD;  Location: [facility] CATH LAB;  Service: Cardiovascular;  Laterality: N/A;    Social History: Social History   Tobacco Use  . Smoking status: Former Smoker    Quit date: 12/03/1976    Years since quitting: 44.4  . Smokeless tobacco: Never Used  Substance Use Topics  . Alcohol use: Yes    Alcohol/week: 1.0 standard  drink    Types: 1 Cans of beer per week    Comment: occ  . Drug use: No   Additional social history:  Please also refer to relevant sections of EMR.  Family History: Family History  Problem Relation Age of Onset  . Cancer Neg Hx   . Heart disease Neg Hx     Allergies and Medications: Allergies  Allergen Reactions  . Ace Inhibitors Cough   No current facility-administered medications on file prior to encounter.   Current Outpatient Medications on File Prior to Encounter  Medication Sig Dispense Refill  . amLODipine (NORVASC) 10 MG tablet TAKE 1 TABLET (10 MG TOTAL) BY MOUTH DAILY. 30 tablet 11  . aspirin EC 81 MG tablet Take 1 tablet (81 mg total) by mouth daily. 90 tablet 3  . carvedilol (COREG) 3.125 MG tablet TAKE 1 TABLET BY MOUTH 2 (TWO) TIMES DAILY. 60 tablet 11  . furosemide (LASIX) 40 MG tablet Take 40 mg by mouth daily as needed (edema).    . naproxen sodium (ALEVE) 220 MG tablet Take 220 mg by mouth daily as needed.    . sacubitril-valsartan (ENTRESTO) 97-103 MG TAKE 1 TABLET BY MOUTH 2 (TWO) TIMES DAILY. MUST KEEP PENDING APPOINTMENT FOR FURTHER REFILLS 60 tablet 1  . spironolactone (ALDACTONE) 25 MG  tablet Take 0.5 tablets (12.5 mg total) by mouth daily. 15 tablet 6    Objective: BP (!) 153/119   Pulse 87   Temp 97.9 F (36.6 C)   Resp (!) 23   SpO2 97%  Exam: General: Awake, alert, no distress, pleasant, cooperative, appears younger than stated age Eyes: EOMI, sclera nonicteric ENTM: Nares patent, MMM Neck: Supple, JVD appreciated to jaw Cardiovascular: RRR, no murmurs Respiratory: Crackles at bilateral bases, saturating well on room air, without respiratory distress, without wheezing/rhonchi Gastrointestinal: Abdomen is soft, tender to palpation of lower abdomen, reports secondary to full bladder.  No rebound or guarding MSK: Able to move all extremities spontaneously without difficulty, no obvious deformities Derm: Warm and dry, without edema Neuro:  There is questions appropriately, speech is clear, follows commands, without any focal deficits Psych: Mood is appropriate, pleasant affect  Labs and Imaging: CBC BMET  Recent Labs  Lab 04/29/21 0842  WBC 6.5  HGB 14.4  HCT 42.4  PLT 174   Recent Labs  Lab 04/29/21 0842  NA 140  K 4.4  CL 107  CO2 27  BUN 20  CREATININE 1.25*  GLUCOSE 100*  CALCIUM 9.3     EKG: Sinus rhythm rate 97 bpm, LVH, inverted T waves leads II, III and aVF.  Stable when compared to prior 03/29/2020  DG Chest 2 View  Result Date: 04/29/2021 CLINICAL DATA:  Shortness of breath and chest pain EXAM: CHEST - 2 VIEW COMPARISON:  01/14/2016 FINDINGS: Cardiomegaly. Hilar vascular fullness. Diffuse interstitial opacity with Kerley lines and fissure thickening. IMPRESSION: CHF. Electronically Signed   By: Monte Fantasia M.D.   On: 04/29/2021 08:09    Sharion Settler, DO 04/29/2021, 11:47 AM PGY-1, [place] Intern pager: [phone], text pages welcome  FPTS Upper-Level Resident Addendum   I have independently interviewed and examined the patient. I have discussed the above with the original author and agree with their documentation. Please see also any attending notes.    Carollee Leitz MD PGY-2, [facility] 04/29/2021 7:06 PM  [place] Service pager: [phone] (text pages welcome through [facility])

## 2021-04-29 NOTE — ED Triage Notes (Signed)
Pt reports SOB that is worse at night when lying down and intermittent pressure across chest x 2 weeks.

## 2021-04-29 NOTE — ED Provider Notes (Signed)
Emergency Medicine Provider Triage Evaluation Note  Joel Beck , a 65 y.o. male  was evaluated in triage.  Pt complains of  2 weeks sob, wheezing, worse at night. Difficulty lying flat, intermittent pain with breathing.  Review of Systems  Positive: sob Negative: fever  Physical Exam  BP (!) 157/132 (BP Location: Right Arm)   Pulse (!) 105   Temp 97.9 F (36.6 C)   Resp 20   SpO2 99%  Gen:   Awake, no distress   Resp:  Normal effort, decreased air mvmt MSK:   Moves extremities without difficulty  Other:    Medical Decision Making  Medically screening exam initiated at 7:25 AM.  Appropriate orders placed.  Joel Beck was informed that the remainder of the evaluation will be completed by another provider, this initial triage assessment does not replace that evaluation, and the importance of remaining in the ED until their evaluation is complete.  Sob, labs and imaging ordered   Joel Mail, PA-C 04/29/21 [id number]    Joel Gibbs, DO 04/29/21 1516

## 2021-04-30 ENCOUNTER — Observation Stay (HOSPITAL_BASED_OUTPATIENT_CLINIC_OR_DEPARTMENT_OTHER): Payer: Medicare (Managed Care)

## 2021-04-30 DIAGNOSIS — I5023 Acute on chronic systolic (congestive) heart failure: Secondary | ICD-10-CM | POA: Diagnosis not present

## 2021-04-30 LAB — ECHOCARDIOGRAM COMPLETE
Area-P 1/2: 4.7 cm2
Calc EF: 29.4 %
Height: 70 in
P 1/2 time: 443 ms
S' Lateral: 5.9 cm
Single Plane A2C EF: 27.1 %
Single Plane A4C EF: 30.5 %
Weight: 2736 [oz_av]

## 2021-04-30 LAB — CBC
HCT: 44.7 % (ref 39.0–52.0)
Hemoglobin: 15.2 g/dL (ref 13.0–17.0)
MCH: 31.5 pg (ref 26.0–34.0)
MCHC: 34 g/dL (ref 30.0–36.0)
MCV: 92.7 fL (ref 80.0–100.0)
Platelets: 185 10*3/uL (ref 150–400)
RBC: 4.82 MIL/uL (ref 4.22–5.81)
RDW: 12.3 % (ref 11.5–15.5)
WBC: 5.5 10*3/uL (ref 4.0–10.5)
nRBC: 0 % (ref 0.0–0.2)

## 2021-04-30 LAB — LIPID PANEL
Cholesterol: 184 mg/dL (ref 0–200)
HDL: 42 mg/dL (ref >40)
LDL Cholesterol: 122 mg/dL — ABNORMAL HIGH (ref 0–99)
Total CHOL/HDL Ratio: 4.4 RATIO
Triglycerides: 100 mg/dL (ref <150)
VLDL: 20 mg/dL (ref 0–40)

## 2021-04-30 LAB — BASIC METABOLIC PANEL
Anion gap: 8 (ref 5–15)
BUN: 18 mg/dL (ref 8–23)
CO2: 27 mmol/L (ref 22–32)
Calcium: 8.8 mg/dL — ABNORMAL LOW (ref 8.9–10.3)
Chloride: 105 mmol/L (ref 98–111)
Creatinine, Ser: 1.36 mg/dL — ABNORMAL HIGH (ref 0.61–1.24)
GFR, Estimated: 58 mL/min — ABNORMAL LOW (ref >60)
Glucose, Bld: 97 mg/dL (ref 70–99)
Potassium: 3.3 mmol/L — ABNORMAL LOW (ref 3.5–5.1)
Sodium: 140 mmol/L (ref 135–145)

## 2021-04-30 MED ORDER — SACUBITRIL-VALSARTAN 97-103 MG PO TABS
ORAL_TABLET | ORAL | 1 refills | Status: DC
  Filled 2021-04-30: qty 60, 30d supply, fill #0

## 2021-04-30 MED ORDER — POTASSIUM CHLORIDE CRYS ER 20 MEQ PO TBCR
20.0000 meq | EXTENDED_RELEASE_TABLET | Freq: Every day | ORAL | 0 refills | Status: DC
  Filled 2021-04-30: qty 30, 30d supply, fill #0

## 2021-04-30 MED ORDER — SPIRONOLACTONE 25 MG PO TABS
12.5000 mg | ORAL_TABLET | Freq: Every day | ORAL | 6 refills | Status: DC
  Filled 2021-04-30: qty 15, 30d supply, fill #0
  Filled 2021-05-29: qty 15, 30d supply, fill #1

## 2021-04-30 MED ORDER — FUROSEMIDE 40 MG PO TABS
40.0000 mg | ORAL_TABLET | Freq: Every day | ORAL | 0 refills | Status: DC | PRN
  Filled 2021-04-30: qty 30, 30d supply, fill #0

## 2021-04-30 MED ORDER — SPIRONOLACTONE 12.5 MG HALF TABLET
12.5000 mg | ORAL_TABLET | Freq: Every day | ORAL | Status: DC
  Administered 2021-04-30: 12.5 mg via ORAL
  Filled 2021-04-30: qty 1

## 2021-04-30 MED ORDER — CARVEDILOL 3.125 MG PO TABS
3.1250 mg | ORAL_TABLET | Freq: Two times a day (BID) | ORAL | 0 refills | Status: DC
  Filled 2021-04-30: qty 60, 30d supply, fill #0

## 2021-04-30 MED ORDER — POTASSIUM CHLORIDE CRYS ER 20 MEQ PO TBCR
40.0000 meq | EXTENDED_RELEASE_TABLET | Freq: Once | ORAL | Status: AC
  Administered 2021-04-30: 40 meq via ORAL
  Filled 2021-04-30: qty 2

## 2021-04-30 MED ORDER — POTASSIUM CHLORIDE CRYS ER 20 MEQ PO TBCR
EXTENDED_RELEASE_TABLET | ORAL | 0 refills | Status: DC
  Filled 2021-04-30: qty 30, 30d supply, fill #0

## 2021-04-30 NOTE — Hospital Course (Addendum)
Joel Beck is a 65 y.o. male presenting with shortness of breath consistent with acute on chronic heart failure exacerbation. PMH is significant for HTN.  Shortness of breath 2/2 acute on chronic HFrEF exacerbation Patient presented with an acute heart failure exacerbation with a BNP elevated at 3250.  CXR consistent with fluid overload.  He was diuresed using 40 mg IV Lasix x2 and responded appropriately with approximately 3 L of urine output.  An echocardiogram was completed showing his heart function was stable with LVEF of 20 to 123456, grade 2 diastolic dysfunction.  Abnormal global longitudinal strain.  Patient follows with Dr. [name] in heart failure and we called cardiology to have them schedule a follow-up appointment in their clinic.  Patient was discharged on home heart failure medications as well as 40 mg p.o. Lasix which she will take for 3 days after discharge and then as needed for edema.  We also prescribed potassium for him to take on days that he takes Lasix.

## 2021-04-30 NOTE — Discharge Summary (Addendum)
I have reviewed the below documentation. I have seen the patient on the day of discharge and agree with the below exam.  In addition to below, I recommend the following at follow up:  Repeat BMP at follow up Recommend consultation with pharmacy and HF nursing to engage patient in education about medications and dietary changes  Clarification on below--should take KCl on days he takes lasix.  Follow up with Cardiology arranged through Dr. Harrell Gave.   Dorris Singh, MD  [facility] Discharge Summary  Patient name: YASIIN JOLLIFF Medical record number: [id number] Date of birth: 11-14-1956 Age: 65 y.o. Gender: male Date of Admission: 04/29/2021  Date of Discharge: 04/30/2021 Admitting Physician: Sharion Settler, DO  Primary Care Provider: [name], MD Consultants: [facility] cardiology to have him scheduled for follow-up with heart failure  Indication for Hospitalization: Heart failure exacerbation  Discharge Diagnoses/Problem List:  Shortness of breath 2/2 acute on chronic HFrEF-improved Hypertension-chronic EKG abnormalities-stable GERD-chronic and stable  Disposition: Home  Discharge Condition: Improved  Discharge Exam:  Temp:  [97.9 F (36.6 C)-98.1 F (36.7 C)] 97.9 F (36.6 C) (05/29 0353) Pulse Rate:  [76-105] 76 (05/29 0353) Resp:  [13-26] 18 (05/29 0353) BP: (123-157)/(69-132) 129/93 (05/29 0353) SpO2:  [97 %-99 %] 99 % (05/29 0353) Weight:  [77.6 kg-80.2 kg] 77.6 kg (05/29 0354) Physical Exam: General: Well-appearing, no acute distress, laying flat on his back in bed Cardiovascular: Regular rate and rhythm, no murmurs appreciated Respiratory: Normal work of breathing, lungs clear to auscultation bilaterally, no wheezes rhonchi or rails Abdomen: Soft, nontender, positive bowel sounds Extremities: No lower extremity edema noted  Brief Hospital Course:  ALGER ASTON is a 65 y.o. male  presenting with shortness of breath consistent with acute on chronic heart failure exacerbation. PMH is significant for HTN.  Shortness of breath 2/2 acute on chronic HFrEF exacerbation Patient presented with an acute heart failure exacerbation with a BNP elevated at 3250.  CXR consistent with fluid overload.  He was diuresed using 40 mg IV Lasix x2 and responded appropriately with approximately 3 L of urine output.  An echocardiogram was completed showing his heart function was stable with LVEF of 20 to 123456, grade 2 diastolic dysfunction.  Abnormal global longitudinal strain.  Patient follows with Dr. [name] in heart failure and we called cardiology to have them schedule a follow-up appointment in their clinic.  Patient was discharged on home heart failure medications as well as 40 mg p.o. Lasix which she will take for 3 days after discharge and then as needed for edema.  We also prescribed potassium for him to take on days that he takes Lasix.    Issues for Follow Up:  1. Patient needs to follow-up with heart failure team regarding medication management 2. Patient was discharged with 40 mg oral Lasix as needed for fluid overload 3. Follow-up with PCP in 1 week, consider repeat BMP to ensure potassium levels are where they need to be  Significant Procedures:  Echocardiogram- 04/30/2021  Significant Labs and Imaging:  Recent Labs  Lab 04/26/21 1045 04/29/21 0842 04/30/21 0626  WBC 6.2 6.5 5.5  HGB 15.3 14.4 15.2  HCT 45.1 42.4 44.7  PLT 160 174 185   Recent Labs  Lab 04/26/21 1045 04/29/21 0842 04/29/21 1620 04/30/21 0626  NA 138 140  --  140  K 4.5 4.4  --  3.3*  CL 104 107  --  105  CO2  27 27  --  27  GLUCOSE 100* 100*  --  97  BUN 17 20  --  18  CREATININE 1.33* 1.25*  --  1.36*  CALCIUM 8.8* 9.3  --  8.8*  MG  --   --  2.0  --    BNP- 3250.7 Troponin-35> 32  Lipid panel - Cholesterol 104 - HDL 42 - LDL 122 - Triglycerides 100  DG Chest 2 View  Result Date:  04/29/2021 CLINICAL DATA:  Shortness of breath and chest pain EXAM: CHEST - 2 VIEW COMPARISON:  01/14/2016 FINDINGS: Cardiomegaly. Hilar vascular fullness. Diffuse interstitial opacity with Kerley lines and fissure thickening. IMPRESSION: CHF. Electronically Signed   By: Monte Fantasia M.D.   On: 04/29/2021 08:09   ECHOCARDIOGRAM COMPLETE  Result Date: 04/30/2021    ECHOCARDIOGRAM REPORT   Patient Name:   WILBERN THAI Date of Exam: 04/30/2021 Medical Rec #:  [id number]     Height:       70.0 in Accession #:    [id number]    Weight:       171.0 lb Date of Birth:  07-Jan-1956    BSA:          1.953 m Patient Age:    65 years      BP:           129/93 mmHg Patient Gender: M             HR:           61 bpm. Exam Location:  Inpatient Procedure: 2D Echo, 3D Echo, Cardiac Doppler, Color Doppler and Strain Analysis Indications:    I50.40* Unspecified combined systolic (congestive) and diastolic                 (congestive) heart failure  History:        Patient has prior history of Echocardiogram examinations, most                 recent 04/25/2020. CHF, Signs/Symptoms:Chest Pain; Risk                 Factors:Hypertension. Elevated troponin.  Sonographer:    Roseanna Rainbow RDCS Referring Phys: [id number] KRISTIE M HORTON  Sonographer Comments: Global longitudinal strain was attempted. IMPRESSIONS  1. Left ventricular ejection fraction, by estimation, is 20 to 25%. The left ventricle has severely decreased function. The left ventricle demonstrates global hypokinesis. The left ventricular internal cavity size was severely dilated. There is moderate  left ventricular hypertrophy. Left ventricular diastolic parameters are consistent with Grade II diastolic dysfunction (pseudonormalization). The average left ventricular global longitudinal strain is -7.5 %. The global longitudinal strain is abnormal.  2. Right ventricular systolic function is normal. The right ventricular size is normal.  3. Left atrial size was mildly dilated.  4.  The mitral valve is normal in structure. Mild mitral valve regurgitation. No evidence of mitral stenosis.  5. The aortic valve is tricuspid. Aortic valve regurgitation is mild. No aortic stenosis is present.  6. Aortic dilatation noted. There is mild dilatation of the ascending aorta, measuring 44 mm.  7. The inferior vena cava is normal in size with greater than 50% respiratory variability, suggesting right atrial pressure of 3 mmHg. FINDINGS  Left Ventricle: Left ventricular ejection fraction, by estimation, is 20 to 25%. The left ventricle has severely decreased function. The left ventricle demonstrates global hypokinesis. The average left ventricular global longitudinal strain is -7.5 %. The global longitudinal strain is abnormal. The  left ventricular internal cavity size was severely dilated. There is moderate left ventricular hypertrophy. Left ventricular diastolic parameters are consistent with Grade II diastolic dysfunction (pseudonormalization). Right Ventricle: The right ventricular size is normal. Right ventricular systolic function is normal. Left Atrium: Left atrial size was mildly dilated. Right Atrium: Right atrial size was normal in size. Pericardium: Trivial pericardial effusion is present. Mitral Valve: The mitral valve is normal in structure. Mild mitral valve regurgitation. No evidence of mitral valve stenosis. Tricuspid Valve: The tricuspid valve is normal in structure. Tricuspid valve regurgitation is trivial. No evidence of tricuspid stenosis. Aortic Valve: The aortic valve is tricuspid. Aortic valve regurgitation is mild. Aortic regurgitation PHT measures 443 msec. No aortic stenosis is present. Pulmonic Valve: The pulmonic valve was normal in structure. Pulmonic valve regurgitation is trivial. No evidence of pulmonic stenosis. Aorta: Aortic dilatation noted. There is mild dilatation of the ascending aorta, measuring 44 mm. Venous: The inferior vena cava is normal in size with greater than  50% respiratory variability, suggesting right atrial pressure of 3 mmHg. IAS/Shunts: No atrial level shunt detected by color flow Doppler.  LEFT VENTRICLE PLAX 2D LVIDd:         6.50 cm      Diastology LVIDs:         5.90 cm      LV e' medial:    3.85 cm/s LV PW:         1.20 cm      LV E/e' medial:  14.9 LV IVS:        1.40 cm      LV e' lateral:   3.85 cm/s LVOT diam:     2.30 cm      LV E/e' lateral: 14.9 LV SV:         57 LV SV Index:   29           2D Longitudinal Strain LVOT Area:     4.15 cm     2D Strain GLS Avg:     -7.5 %  LV Volumes (MOD) LV vol d, MOD A2C: 170.0 ml LV vol d, MOD A4C: 187.0 ml LV vol s, MOD A2C: 124.0 ml LV vol s, MOD A4C: 130.0 ml LV SV MOD A2C:     46.0 ml LV SV MOD A4C:     187.0 ml LV SV MOD BP:      53.7 ml RIGHT VENTRICLE             IVC RV S prime:     13.90 cm/s  IVC diam: 1.80 cm TAPSE (M-mode): 2.4 cm LEFT ATRIUM             Index       RIGHT ATRIUM           Index LA diam:        3.30 cm 1.69 cm/m  RA Area:     14.90 cm LA Vol (A2C):   74.7 ml 38.25 ml/m RA Volume:   37.50 ml  19.20 ml/m LA Vol (A4C):   47.4 ml 24.27 ml/m LA Biplane Vol: 62.7 ml 32.11 ml/m  AORTIC VALVE             PULMONIC VALVE LVOT Vmax:   79.90 cm/s  PR End Diast Vel: 1.70 msec LVOT Vmean:  49.000 cm/s LVOT VTI:    0.136 m AI PHT:      443 msec  AORTA Ao Root diam: 3.60 cm Ao Asc diam:  4.30  cm MITRAL VALVE MV Area (PHT): 4.70 cm    SHUNTS MV Decel Time: 162 msec    Systemic VTI:  0.14 m MV E velocity: 57.25 cm/s  Systemic Diam: 2.30 cm MV A velocity: 48.50 cm/s MV E/A ratio:  1.18 Kirk Ruths MD Electronically signed by Kirk Ruths MD Signature Date/Time: 04/30/2021/10:38:09 AM    Final     Results/Tests Pending at Time of Discharge: None  Discharge Medications:  Allergies as of 04/30/2021      Reactions   Ace Inhibitors Cough      Medication List    STOP taking these medications   amLODipine 10 MG tablet Commonly known as: NORVASC   naproxen sodium 220 MG tablet Commonly  known as: ALEVE     TAKE these medications   aspirin EC 81 MG tablet Take 1 tablet (81 mg total) by mouth daily.   carvedilol 3.125 MG tablet Commonly known as: COREG Take 1 tablet (3.125 mg total) by mouth 2 (two) times daily.   furosemide 40 MG tablet Commonly known as: LASIX Take 1 tablet (40 mg total) by mouth daily as needed (edema).   potassium chloride SA 20 MEQ tablet Commonly known as: KLOR-CON Take 1 tablet (20 mEq total) by mouth daily.   potassium chloride SA 20 MEQ tablet Commonly known as: Klor-Con M20 Take 1 tablet on days that you take Lasix   sacubitril-valsartan 97-103 MG Commonly known as: ENTRESTO TAKE 1 TABLET BY MOUTH 2 (TWO) TIMES DAILY. MUST KEEP PENDING APPOINTMENT FOR FURTHER REFILLS What changed:   how much to take  how to take this  when to take this   spironolactone 25 MG tablet Commonly known as: ALDACTONE Take 0.5 tablets (12.5 mg total) by mouth daily. What changed: how much to take       Discharge Instructions: Please refer to Patient Instructions section of EMR for full details.  Patient was counseled important signs and symptoms that should prompt return to medical care, changes in medications, dietary instructions, activity restrictions, and follow up appointments.   Follow-Up Appointments:   Gifford Shave, MD 04/30/2021, 2:06 PM PGY-2, [place]

## 2021-04-30 NOTE — Evaluation (Signed)
Occupational Therapy Evaluation/Discharge Patient Details Name: Joel Beck MRN: [id number] DOB: 1956-04-26 Today's Date: 04/30/2021    History of Present Illness Joel Beck is a 65 y.o. male presenting with shortness of breath consistent with acute on chronic heart failure exacerbation. PMH is significant for HTN   Clinical Impression   PTA, pt lives with spouse and reports Independence with all daily tasks without AD. Pt reports recently starting back at gym prior to admission. Pt presents now with improvements in breathing, denies SOB with activity and HR sustained 90s bpm with activity. Pt Independent and at baseline for all ADLs, mobility at this time. Encouraged pt to complete hallway mobility independently to maximize cardiopulmonary endurance. No further skilled OT services needed at this time. OT to sign off.     Follow Up Recommendations  No OT follow up    Equipment Recommendations  None recommended by OT    Recommendations for Other Services       Precautions / Restrictions Precautions Precautions: None Restrictions Weight Bearing Restrictions: No      Mobility Bed Mobility Overal bed mobility: Independent                  Transfers Overall transfer level: Independent Equipment used: None                  Balance Overall balance assessment: Independent                                         ADL either performed or assessed with clinical judgement   ADL Overall ADL's : Independent                                       General ADL Comments: Independent in donning gown around back in standing, hallway mobility. Was going to bathroom independently in room     Vision Baseline Vision/History: No visual deficits Patient Visual Report: No change from baseline Vision Assessment?: No apparent visual deficits     Perception     Praxis      Pertinent Vitals/Pain Pain Assessment: No/denies pain      Hand Dominance Right   Extremity/Trunk Assessment Upper Extremity Assessment Upper Extremity Assessment: Overall WFL for tasks assessed   Lower Extremity Assessment Lower Extremity Assessment: Overall WFL for tasks assessed   Cervical / Trunk Assessment Cervical / Trunk Assessment: Normal   Communication Communication Communication: No difficulties;Other (comment) (can communicate in English, interpreter helpful for more in depth education)   Cognition Arousal/Alertness: Awake/alert Behavior During Therapy: WFL for tasks assessed/performed Overall Cognitive Status: Within Functional Limits for tasks assessed                                     General Comments  HR 90s bpm with activity, denies SOB    Exercises     Shoulder Instructions      Home Living Family/patient expects to be discharged to:: Private residence Living Arrangements: Spouse/significant other Available Help at Discharge: Family;Available 24 hours/day Type of Home: Apartment Home Access: Level entry     Home Layout: One level     Bathroom Shower/Tub: Teacher, early years/pre: Standard     Home Equipment: None  Prior Functioning/Environment Level of Independence: Independent        Comments: Was getting back into going to the gym        OT Problem List:        OT Treatment/Interventions:      OT Goals(Current goals can be found in the care plan section) Acute Rehab OT Goals Patient Stated Goal: eat breakfast OT Goal Formulation: All assessment and education complete, DC therapy  OT Frequency:     Barriers to D/C:            Co-evaluation              AM-PAC OT "6 Clicks" Daily Activity     Outcome Measure Help from another person eating meals?: None Help from another person taking care of personal grooming?: None Help from another person toileting, which includes using toliet, bedpan, or urinal?: None Help from another person  bathing (including washing, rinsing, drying)?: None Help from another person to put on and taking off regular upper body clothing?: None Help from another person to put on and taking off regular lower body clothing?: None 6 Click Score: 24   End of Session Nurse Communication: Mobility status;Other (comment) (request for more ginger ale)  Activity Tolerance: Patient tolerated treatment well Patient left: in bed;with call bell/phone within reach  OT Visit Diagnosis: Other (comment) (decreased cardiopulmonary tolerance)                Time: [id number] OT Time Calculation (min): 19 min Charges:  OT General Charges $OT Visit: 1 Visit OT Evaluation $OT Eval Low Complexity: 1 Low  Malachy Chamber, OTR/L Acute Rehab Services Office: [phone]  Layla Maw 04/30/2021, 7:16 AM

## 2021-04-30 NOTE — Discharge Instructions (Signed)
Thank you for allowing [place] to participate in your care!    You were admitted for a heart failure exacerbation.  We gave you medications to help you urinate some of the fluids out.  We also had an echocardiogram completed showing your heart failure is stable from previous exams.  We contacted the heart failure team and they are going to schedule a follow-up appointment with you very soon.  Regarding your medications we have restarted all of your medications and [facility].  I would like for you to take the Lasix we are prescribing for 3 days after discharge and then take as needed for edema.  I also sent a prescription for potassium which I want you to take on the days that you take the Lasix.  Regarding your blood pressure medications I want you to not take your amlodipine (Norvasc) until you follow-up with your heart failure team.  If you have a return of symptoms please seek medical attention immediately.  I would like for you to follow-up with your primary care provider next week.  If you experience worsening of your admission symptoms, develop shortness of breath, life threatening emergency, suicidal or homicidal thoughts you must seek medical attention immediately by calling 911 or calling your MD immediately  if symptoms less severe.    Heart Failure, Self-Care Heart failure is a serious condition. The following information explains things you need to do to take care of yourself at home. To help you stay as healthy as possible, you may be asked to change your diet, take certain medicines, and make other changes in your life. Your doctor may also give you more specific instructions. If you have problems or questions, call your doctor. What are the risks? Having heart failure makes it more likely for you to have some problems. These problems can get worse if you do not take good care of yourself. Problems may include:  Damage to  the kidneys, liver, or lungs.  Malnutrition.  Abnormal heart rhythms.  Blood clotting problems that could cause a stroke. Supplies needed:  Scale for weighing yourself.  Blood pressure monitor.  Notebook.  Medicines. How to care for yourself when you have heart failure Medicines Take over-the-counter and prescription medicines only as told by your doctor. Take your medicines every day.  Do not stop taking your medicine unless your doctor tells you to do so.  Do not skip any medicines.  Get your prescriptions refilled before you run out of medicine. This is important.  Talk with your doctor if you cannot afford your medicines. Eating and drinking  Eat heart-healthy foods. Talk with a diet specialist (dietitian) to create an eating plan.  Limit salt (sodium) if told by your doctor. Ask your diet specialist to tell you which seasonings are healthy for your heart.  Cook in healthy ways instead of frying. Healthy ways of cooking include roasting, grilling, broiling, baking, poaching, steaming, and stir-frying.  Choose foods that: ? Have no trans fat. ? Are low in saturated fat and cholesterol.  Choose healthy foods, such as: ? Fresh or frozen fruits and vegetables. ? Fish. ? Low-fat (lean) meats. ? Legumes, such as beans, peas, and lentils. ? Fat-free or low-fat dairy products. ? Whole-grain foods. ? High-fiber foods.  Limit how much fluid you drink, if told by your doctor.   Alcohol use  Do not drink alcohol if: ? Your doctor tells you not to drink. ?  Your heart was damaged by alcohol, or you have very bad heart failure. ? You are pregnant, may be pregnant, or are planning to become pregnant.  If you drink alcohol: ? Limit how much you have to:  0-1 drink a day for women.  0-2 drinks a day for men. ? Know how much alcohol is in your drink. In the [place] one drink equals one 12 oz bottle of beer (355 mL), one 5 oz glass of wine (148 mL), or one 1 oz glass of  hard liquor (44 mL). Lifestyle  Do not smoke or use any products that contain nicotine or tobacco. If you need help quitting, ask your doctor. ? Do not use nicotine gum or patches before talking to your doctor.  Do not use illegal drugs.  Lose weight if told by your doctor.  Do physical activity if told by your doctor. Talk to your doctor before you begin an exercise if: ? You are an older adult. ? You have very bad heart failure.  Learn to manage stress. If you need help, ask your doctor.  Get physical rehab (rehabilitation) to help you stay independent and to help with your quality of life.  Participate in a cardiac rehab program. This program helps you improve your health through exercise, education, and counseling.  Plan time to rest when you get tired.   Check weight and blood pressure  Weigh yourself every day. This will help you to know if fluid is building up in your body. ? Weigh yourself every morning after you pee (urinate) and before you eat breakfast. ? Wear the same amount of clothing each time. ? Write down your daily weight. Give your record to your doctor.  Check and write down your blood pressure as told by your doctor.  Check your pulse as told by your doctor.   Dealing with very hot and very cold weather  If it is very hot: ? Avoid activities that take a lot of energy. ? Use air conditioning or fans, or find a cooler place. ? Avoid caffeine and alcohol. ? Wear clothing that is loose-fitting, lightweight, and light-colored.  If it is very cold: ? Avoid activities that take a lot of energy. ? Layer your clothes. ? Wear mittens or gloves, a hat, and a face covering when you go outside. ? Avoid alcohol. Follow these instructions at home:  Stay up to date with shots (vaccines). Get pneumococcal and flu (influenza) shots.  Keep all follow-up visits. Contact a doctor if:  You gain 2-3 lb (1-1.4 kg) in 24 hours or 5 lb (2.3 kg) in a week.  You have  increasing shortness of breath.  You cannot do your normal activities.  You get tired easily.  You cough a lot.  You do not feel like eating or feel like you may vomit (nauseous).  You have swelling in your hands, feet, ankles, or belly (abdomen).  You cannot sleep well because it is hard to breathe.  You feel like your heart is beating fast (palpitations).  You get dizzy when you stand up.  You feel depressed or sad. Get help right away if:  You have trouble breathing.  You or someone else notices a change in your behavior, such as having trouble staying awake.  You have chest pain or discomfort.  You pass out (faint). These symptoms may be an emergency. Get help right away. Call your local emergency services (911 in the [place]  Do not wait to see if  the symptoms will go away.  Do not drive yourself to the hospital. Summary  Heart failure is a serious condition. To care for yourself, you may have to change your diet, take medicines, and make other lifestyle changes.  Take your medicines every day. Do not stop taking them unless your doctor tells you to do so.  Limit salt and eat heart-healthy foods.  Ask your doctor if you can drink alcohol. You may have to stop alcohol use if you have very bad heart failure.  Contact your doctor if you gain weight quickly or feel that your heart is beating too fast. Get help right away if you pass out or have chest pain or trouble breathing. This information is not intended to replace advice given to you by your health care provider. Make sure you discuss any questions you have with your health care provider. Document Revised: 06/11/2020 Document Reviewed: 06/11/2020 Elsevier Patient Education  [employer].

## 2021-04-30 NOTE — Progress Notes (Signed)
PT Cancellation Note  Patient Details Name: Joel Beck MRN: [id number] DOB: Jun 03, 1956   Cancelled Treatment:    Reason Eval/Treat Not Completed: PT screened, no needs identified, will sign off. Pt was evaluated by the OT with no PT needs identified. PT signing off.    [place] 04/30/2021, 9:21 AM

## 2021-04-30 NOTE — Progress Notes (Signed)
  Echocardiogram 2D Echocardiogram has been performed.  Joel Beck 04/30/2021, 8:48 AM

## 2021-04-30 NOTE — Care Management Obs Status (Signed)
[facility] NOTIFICATION   Patient Details  Name: SENOVIO YARA MRN: [id number] Date of Birth: Sep 17, 1956   Medicare Observation Status Notification Given:  Yes    Norina Buzzard, RN 04/30/2021, 11:03 AM

## 2021-05-02 ENCOUNTER — Telehealth: Payer: Self-pay

## 2021-05-02 ENCOUNTER — Other Ambulatory Visit: Payer: Self-pay

## 2021-05-02 NOTE — Telephone Encounter (Signed)
Transition Care Management Unsuccessful Follow-up Telephone Call  Date of discharge and from where:  04/30/2021, [facility]   Attempts:  1st Attempt  Reason for unsuccessful TCM follow-up call:  Left voice message # [phone] and # [phone] ( the name on this recording is [place])  Need to confirm if patient has been seeing another PCP. He has not been seen at [facility] in over 5 years.

## 2021-05-03 ENCOUNTER — Telehealth: Payer: Self-pay

## 2021-05-03 NOTE — Telephone Encounter (Signed)
Transition Care Management Follow-up Telephone Call  Call placed to patient with assistance of Spanish Interpreter # [id number]/[employer]  Date of discharge and from where: 04/30/2021, [facility]    How have you been since you were released from the hospital? He said he has a new PCP and has already spoken to him. Has appointment with this PCP later this month

## 2021-05-18 ENCOUNTER — Other Ambulatory Visit: Payer: Self-pay

## 2021-05-18 ENCOUNTER — Other Ambulatory Visit: Payer: Self-pay | Admitting: Family Medicine

## 2021-05-27 ENCOUNTER — Emergency Department (HOSPITAL_COMMUNITY)
Admission: EM | Admit: 2021-05-27 | Discharge: 2021-05-27 | Disposition: A | Discharge status: Home / Self Care | Payer: Medicare (Managed Care) | Attending: Emergency Medicine | Admitting: Emergency Medicine

## 2021-05-27 ENCOUNTER — Encounter (HOSPITAL_COMMUNITY): Payer: Self-pay | Admitting: Emergency Medicine

## 2021-05-27 ENCOUNTER — Emergency Department (HOSPITAL_COMMUNITY): Payer: Medicare (Managed Care)

## 2021-05-27 DIAGNOSIS — R600 Localized edema: Secondary | ICD-10-CM | POA: Insufficient documentation

## 2021-05-27 DIAGNOSIS — Z87891 Personal history of nicotine dependence: Secondary | ICD-10-CM | POA: Diagnosis not present

## 2021-05-27 DIAGNOSIS — R1011 Right upper quadrant pain: Secondary | ICD-10-CM | POA: Insufficient documentation

## 2021-05-27 DIAGNOSIS — Z79899 Other long term (current) drug therapy: Secondary | ICD-10-CM | POA: Diagnosis not present

## 2021-05-27 DIAGNOSIS — I5023 Acute on chronic systolic (congestive) heart failure: Secondary | ICD-10-CM | POA: Insufficient documentation

## 2021-05-27 DIAGNOSIS — Z7982 Long term (current) use of aspirin: Secondary | ICD-10-CM | POA: Insufficient documentation

## 2021-05-27 DIAGNOSIS — I130 Hypertensive heart and chronic kidney disease with heart failure and stage 1 through stage 4 chronic kidney disease, or unspecified chronic kidney disease: Secondary | ICD-10-CM | POA: Diagnosis not present

## 2021-05-27 DIAGNOSIS — N189 Chronic kidney disease, unspecified: Secondary | ICD-10-CM | POA: Insufficient documentation

## 2021-05-27 DIAGNOSIS — K219 Gastro-esophageal reflux disease without esophagitis: Secondary | ICD-10-CM | POA: Insufficient documentation

## 2021-05-27 DIAGNOSIS — R0602 Shortness of breath: Secondary | ICD-10-CM | POA: Diagnosis not present

## 2021-05-27 LAB — BASIC METABOLIC PANEL
Anion gap: 8 (ref 5–15)
BUN: 17 mg/dL (ref 8–23)
CO2: 26 mmol/L (ref 22–32)
Calcium: 9.3 mg/dL (ref 8.9–10.3)
Chloride: 101 mmol/L (ref 98–111)
Creatinine, Ser: 1.54 mg/dL — ABNORMAL HIGH (ref 0.61–1.24)
GFR, Estimated: 50 mL/min — ABNORMAL LOW (ref >60)
Glucose, Bld: 150 mg/dL — ABNORMAL HIGH (ref 70–99)
Potassium: 4.2 mmol/L (ref 3.5–5.1)
Sodium: 135 mmol/L (ref 135–145)

## 2021-05-27 LAB — HEPATIC FUNCTION PANEL
ALT: 92 U/L — ABNORMAL HIGH (ref 0–44)
AST: 36 U/L (ref 15–41)
Albumin: 3.6 g/dL (ref 3.5–5.0)
Alkaline Phosphatase: 67 U/L (ref 38–126)
Bilirubin, Direct: 0.3 mg/dL — ABNORMAL HIGH (ref 0.0–0.2)
Indirect Bilirubin: 1.1 mg/dL — ABNORMAL HIGH (ref 0.3–0.9)
Total Bilirubin: 1.4 mg/dL — ABNORMAL HIGH (ref 0.3–1.2)
Total Protein: 6.1 g/dL — ABNORMAL LOW (ref 6.5–8.1)

## 2021-05-27 LAB — CBC
HCT: 45.9 % (ref 39.0–52.0)
Hemoglobin: 15.3 g/dL (ref 13.0–17.0)
MCH: 32 pg (ref 26.0–34.0)
MCHC: 33.3 g/dL (ref 30.0–36.0)
MCV: 96 fL (ref 80.0–100.0)
Platelets: 167 10*3/uL (ref 150–400)
RBC: 4.78 MIL/uL (ref 4.22–5.81)
RDW: 12.9 % (ref 11.5–15.5)
WBC: 5.9 10*3/uL (ref 4.0–10.5)
nRBC: 0 % (ref 0.0–0.2)

## 2021-05-27 LAB — LIPASE, BLOOD: Lipase: 29 U/L (ref 11–51)

## 2021-05-27 LAB — BRAIN NATRIURETIC PEPTIDE: B Natriuretic Peptide: 4482.8 pg/mL — ABNORMAL HIGH (ref 0.0–100.0)

## 2021-05-27 MED ORDER — FUROSEMIDE 40 MG PO TABS
40.0000 mg | ORAL_TABLET | Freq: Every day | ORAL | 0 refills | Status: DC
  Filled 2021-05-27: qty 7, 7d supply, fill #0

## 2021-05-27 MED ORDER — FUROSEMIDE 20 MG PO TABS
40.0000 mg | ORAL_TABLET | Freq: Once | ORAL | Status: AC
  Administered 2021-05-27: 40 mg via ORAL
  Filled 2021-05-27: qty 2

## 2021-05-27 MED ORDER — FUROSEMIDE 10 MG/ML IJ SOLN: 40.0000 mg | Freq: Once | INTRAMUSCULAR | Status: DC
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
## 2021-05-27 NOTE — Progress Notes (Signed)
While rounding Chaplain stepped in to visit with this pt who had a limited grasp of English.  When he understood the word Chaplain he asked for prayers saying he is Catholic.  Chaplain prayed at bedside.  Joel Beck

## 2021-05-27 NOTE — Discharge Instructions (Addendum)
  Take 40 mg of the furosemide (Lasix) twice daily instead of once daily.  Take this increased dose for the next few days until seen at the heart failure clinic.  Return to the emergency department for increased shortness of breath, onset of chest pain, dizziness, passing out, fever with your symptoms, or any other major concerns.  Tome 40 mg de furosemida (Lasix) dos veces al da en lugar de una vez al da. Tome esta dosis aumentada durante los prximos [employer] lo vean en la clnica de insuficiencia cardaca.  Regrese al departamento de emergencias si tiene ms dificultad para Ambulance person, aparicin de [employer] pecho, mareos, [place], fiebre con sus sntomas o cualquier otra inquietud importante.

## 2021-05-29 ENCOUNTER — Other Ambulatory Visit: Payer: Self-pay

## 2021-06-08 ENCOUNTER — Other Ambulatory Visit: Payer: Self-pay | Admitting: Emergency Medicine

## 2021-06-08 ENCOUNTER — Other Ambulatory Visit: Payer: Self-pay
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
## 2021-06-09 ENCOUNTER — Other Ambulatory Visit: Payer: Self-pay

## 2021-06-09 ENCOUNTER — Other Ambulatory Visit (HOSPITAL_COMMUNITY): Payer: Self-pay | Admitting: Family Medicine

## 2021-06-09 ENCOUNTER — Ambulatory Visit (HOSPITAL_COMMUNITY)
Admission: RE | Admit: 2021-06-09 | Discharge: 2021-06-09 | Disposition: A | Discharge status: Home / Self Care | Payer: Medicare (Managed Care) | Source: Ambulatory Visit | Attending: Family Medicine | Admitting: Family Medicine

## 2021-06-09 ENCOUNTER — Encounter (HOSPITAL_COMMUNITY): Payer: Self-pay

## 2021-06-09 VITALS — BP 124/89 | HR 93 | Wt 186.0 lb

## 2021-06-09 DIAGNOSIS — I428 Other cardiomyopathies: Secondary | ICD-10-CM | POA: Diagnosis not present

## 2021-06-09 DIAGNOSIS — I10 Essential (primary) hypertension: Secondary | ICD-10-CM

## 2021-06-09 DIAGNOSIS — I5023 Acute on chronic systolic (congestive) heart failure: Secondary | ICD-10-CM | POA: Diagnosis not present

## 2021-06-09 DIAGNOSIS — F40240 Claustrophobia: Secondary | ICD-10-CM | POA: Diagnosis not present

## 2021-06-09 DIAGNOSIS — Z87891 Personal history of nicotine dependence: Secondary | ICD-10-CM | POA: Insufficient documentation

## 2021-06-09 DIAGNOSIS — Z7982 Long term (current) use of aspirin: Secondary | ICD-10-CM | POA: Diagnosis not present

## 2021-06-09 DIAGNOSIS — R1900 Intra-abdominal and pelvic swelling, mass and lump, unspecified site: Secondary | ICD-10-CM | POA: Diagnosis not present

## 2021-06-09 DIAGNOSIS — I110 Hypertensive heart disease with heart failure: Secondary | ICD-10-CM | POA: Insufficient documentation

## 2021-06-09 DIAGNOSIS — M7989 Other specified soft tissue disorders: Secondary | ICD-10-CM | POA: Diagnosis not present

## 2021-06-09 DIAGNOSIS — Z79899 Other long term (current) drug therapy: Secondary | ICD-10-CM | POA: Diagnosis not present

## 2021-06-09 DIAGNOSIS — I5022 Chronic systolic (congestive) heart failure: Secondary | ICD-10-CM

## 2021-06-09 LAB — BASIC METABOLIC PANEL
Anion gap: 5 (ref 5–15)
BUN: 26 mg/dL — ABNORMAL HIGH (ref 8–23)
CO2: 26 mmol/L (ref 22–32)
Calcium: 8.9 mg/dL (ref 8.9–10.3)
Chloride: 108 mmol/L (ref 98–111)
Creatinine, Ser: 1.66 mg/dL — ABNORMAL HIGH (ref 0.61–1.24)
GFR, Estimated: 46 mL/min — ABNORMAL LOW (ref >60)
Glucose, Bld: 101 mg/dL — ABNORMAL HIGH (ref 70–99)
Potassium: 4.1 mmol/L (ref 3.5–5.1)
Sodium: 139 mmol/L (ref 135–145)

## 2021-06-09 LAB — BRAIN NATRIURETIC PEPTIDE: B Natriuretic Peptide: 4414.9 pg/mL — ABNORMAL HIGH (ref 0.0–100.0)

## 2021-06-09 MED ORDER — FUROSEMIDE 10 MG/ML IJ SOLN
80.0000 mg | Freq: Once | INTRAMUSCULAR | Status: AC
  Administered 2021-06-09: 80 mg via INTRAVENOUS

## 2021-06-09 MED ORDER — POTASSIUM CHLORIDE CRYS ER 20 MEQ PO TBCR
40.0000 meq | EXTENDED_RELEASE_TABLET | Freq: Every day | ORAL | 2 refills | Status: DC
  Filled 2021-06-09: qty 60, 30d supply, fill #0

## 2021-06-09 MED ORDER — POTASSIUM CHLORIDE CRYS ER 20 MEQ PO TBCR
40.0000 meq | EXTENDED_RELEASE_TABLET | Freq: Once | ORAL | Status: AC
  Administered 2021-06-09: 40 meq via ORAL

## 2021-06-09 MED ORDER — FUROSEMIDE 20 MG PO TABS
40.0000 mg | ORAL_TABLET | Freq: Every day | ORAL | 11 refills | Status: DC
  Filled 2021-06-09: qty 30, 15d supply, fill #0

## 2021-06-09 MED ORDER — DAPAGLIFLOZIN PROPANEDIOL 10 MG PO TABS
10.0000 mg | ORAL_TABLET | Freq: Every day | ORAL | 11 refills | Status: DC
  Filled 2021-06-09 (×2): qty 30, 30d supply, fill #0
  Filled 2021-08-01 – 2021-08-09 (×2): qty 30, 30d supply, fill #1

## 2021-06-09 NOTE — Patient Instructions (Signed)
START Farxiga '10mg'$ , one tab daily CHANGE Potassium to 40 meq daily  CHANGE Lasix to 40 mg daily   Labs today We will only contact you if something comes back abnormal or we need to make some changes. Otherwise no news is good news!  Labs needed in 10-14 days  Your physician recommends that you schedule a follow-up appointment in: 3 weeks  in the Advanced Practitioners (PA/NP) Clinic    Do the following things EVERYDAY: Weigh yourself in the morning before breakfast. Write it down and keep it in a log. Take your medicines as prescribed Eat low salt foods--Limit salt (sodium) to 2000 mg per day.  Stay as active as you can everyday Limit all fluids for the day to less than 2 liters  At the [facility], you and your health needs are our priority. As part of our continuing mission to provide you with exceptional heart care, we have created designated Provider Care Teams. These Care Teams include your primary Cardiologist (physician) and Advanced Practice Providers (APPs- Physician Assistants and Nurse Practitioners) who all work together to provide you with the care you need, when you need it.   You may see any of the following providers on your designated Care Team at your next follow up: Dr Glori Bickers Dr Loralie Champagne Dr Patrice Paradise, NP Lyda Jester, [place] Ginnie Smart Audry Riles, PharmD   Please be sure to bring in all your medications bottles to every appointment.

## 2021-06-09 NOTE — Progress Notes (Signed)
ReDS Vest / Clip - 06/09/21 1000       ReDS Vest / Clip   Station Marker C    Ruler Value 29    ReDS Value Range High volume overload    ReDS Actual Value 52

## 2021-06-09 NOTE — Progress Notes (Signed)
22 g PIV inserted into R antecubital by Tanda Rockers, RN, pt administered '80mg'$  IV lasix and 40 meq KCL PO per Allena Katz, NP. Pt tolerated all well, pt given urinal and call bell. Will continue to monitor

## 2021-06-09 NOTE — Progress Notes (Signed)
650 cc of Clear yellow urine disposed in toilet at 1115    350 cc of clear yellow urine disposed in toilet at 1230

## 2021-06-13 ENCOUNTER — Other Ambulatory Visit: Payer: Self-pay

## 2021-06-13 ENCOUNTER — Emergency Department (HOSPITAL_COMMUNITY): Payer: Medicare (Managed Care)

## 2021-06-13 ENCOUNTER — Emergency Department (HOSPITAL_COMMUNITY)
Admission: EM | Admit: 2021-06-13 | Discharge: 2021-06-14 | Disposition: A | Discharge status: Home / Self Care | Payer: Medicare (Managed Care) | Attending: Emergency Medicine | Admitting: Emergency Medicine

## 2021-06-13 DIAGNOSIS — I130 Hypertensive heart and chronic kidney disease with heart failure and stage 1 through stage 4 chronic kidney disease, or unspecified chronic kidney disease: Secondary | ICD-10-CM | POA: Insufficient documentation

## 2021-06-13 DIAGNOSIS — R2243 Localized swelling, mass and lump, lower limb, bilateral: Secondary | ICD-10-CM | POA: Diagnosis not present

## 2021-06-13 DIAGNOSIS — R109 Unspecified abdominal pain: Secondary | ICD-10-CM | POA: Insufficient documentation

## 2021-06-13 DIAGNOSIS — R3912 Poor urinary stream: Secondary | ICD-10-CM | POA: Diagnosis not present

## 2021-06-13 DIAGNOSIS — R5383 Other fatigue: Secondary | ICD-10-CM | POA: Diagnosis not present

## 2021-06-13 DIAGNOSIS — Z79899 Other long term (current) drug therapy: Secondary | ICD-10-CM | POA: Insufficient documentation

## 2021-06-13 DIAGNOSIS — N189 Chronic kidney disease, unspecified: Secondary | ICD-10-CM | POA: Diagnosis not present

## 2021-06-13 DIAGNOSIS — Z87891 Personal history of nicotine dependence: Secondary | ICD-10-CM | POA: Insufficient documentation

## 2021-06-13 DIAGNOSIS — I5023 Acute on chronic systolic (congestive) heart failure: Secondary | ICD-10-CM | POA: Insufficient documentation

## 2021-06-13 DIAGNOSIS — R0602 Shortness of breath: Secondary | ICD-10-CM | POA: Insufficient documentation

## 2021-06-13 DIAGNOSIS — Z7982 Long term (current) use of aspirin: Secondary | ICD-10-CM | POA: Insufficient documentation

## 2021-06-13 LAB — COMPREHENSIVE METABOLIC PANEL
ALT: 77 U/L — ABNORMAL HIGH (ref 0–44)
AST: 40 U/L (ref 15–41)
Albumin: 3.2 g/dL — ABNORMAL LOW (ref 3.5–5.0)
Alkaline Phosphatase: 71 U/L (ref 38–126)
Anion gap: 5 (ref 5–15)
BUN: 26 mg/dL — ABNORMAL HIGH (ref 8–23)
CO2: 25 mmol/L (ref 22–32)
Calcium: 8.7 mg/dL — ABNORMAL LOW (ref 8.9–10.3)
Chloride: 108 mmol/L (ref 98–111)
Creatinine, Ser: 1.83 mg/dL — ABNORMAL HIGH (ref 0.61–1.24)
GFR, Estimated: 41 mL/min — ABNORMAL LOW (ref >60)
Glucose, Bld: 115 mg/dL — ABNORMAL HIGH (ref 70–99)
Potassium: 4.4 mmol/L (ref 3.5–5.1)
Sodium: 138 mmol/L (ref 135–145)
Total Bilirubin: 0.9 mg/dL (ref 0.3–1.2)
Total Protein: 5.4 g/dL — ABNORMAL LOW (ref 6.5–8.1)

## 2021-06-13 LAB — CBC WITH DIFFERENTIAL/PLATELET
Abs Immature Granulocytes: 0.01 10*3/uL (ref 0.00–0.07)
Basophils Absolute: 0 10*3/uL (ref 0.0–0.1)
Basophils Relative: 0 %
Eosinophils Absolute: 0.1 10*3/uL (ref 0.0–0.5)
Eosinophils Relative: 1 %
HCT: 43.6 % (ref 39.0–52.0)
Hemoglobin: 14.4 g/dL (ref 13.0–17.0)
Immature Granulocytes: 0 %
Lymphocytes Relative: 18 %
Lymphs Abs: 1 10*3/uL (ref 0.7–4.0)
MCH: 31.6 pg (ref 26.0–34.0)
MCHC: 33 g/dL (ref 30.0–36.0)
MCV: 95.8 fL (ref 80.0–100.0)
Monocytes Absolute: 0.6 10*3/uL (ref 0.1–1.0)
Monocytes Relative: 11 %
Neutro Abs: 3.7 10*3/uL (ref 1.7–7.7)
Neutrophils Relative %: 70 %
Platelets: 161 10*3/uL (ref 150–400)
RBC: 4.55 MIL/uL (ref 4.22–5.81)
RDW: 12.9 % (ref 11.5–15.5)
WBC: 5.4 10*3/uL (ref 4.0–10.5)
nRBC: 0 % (ref 0.0–0.2)

## 2021-06-13 LAB — LIPASE, BLOOD: Lipase: 34 U/L (ref 11–51)

## 2021-06-13 LAB — BRAIN NATRIURETIC PEPTIDE: B Natriuretic Peptide: 4333.6 pg/mL — ABNORMAL HIGH (ref 0.0–100.0)

## 2021-06-13 NOTE — ED Triage Notes (Signed)
Pt states concern for increase bilateral leg swelling with worsening shortness of breath, with difficulty breathing. Has been taking diuretic at home without improvement and decreased urinary output. Has discomfort mid gastric dute to medication. Denies CP. No labored breathing in triage. Denies increase weight gain.
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
## 2021-06-14 ENCOUNTER — Other Ambulatory Visit: Payer: Self-pay

## 2021-06-14 ENCOUNTER — Encounter (HOSPITAL_COMMUNITY): Payer: Self-pay

## 2021-06-14 ENCOUNTER — Emergency Department (HOSPITAL_COMMUNITY): Payer: Medicare (Managed Care)

## 2021-06-14 ENCOUNTER — Emergency Department (HOSPITAL_COMMUNITY)
Admission: EM | Admit: 2021-06-14 | Discharge: 2021-06-14 | Disposition: A | Discharge status: Home / Self Care | Payer: Medicare (Managed Care) | Source: Home / Self Care | Attending: Emergency Medicine | Admitting: Emergency Medicine

## 2021-06-14 DIAGNOSIS — I5023 Acute on chronic systolic (congestive) heart failure: Secondary | ICD-10-CM | POA: Insufficient documentation

## 2021-06-14 DIAGNOSIS — Z79899 Other long term (current) drug therapy: Secondary | ICD-10-CM | POA: Insufficient documentation

## 2021-06-14 DIAGNOSIS — R0602 Shortness of breath: Secondary | ICD-10-CM

## 2021-06-14 DIAGNOSIS — I509 Heart failure, unspecified: Secondary | ICD-10-CM

## 2021-06-14 DIAGNOSIS — Z7982 Long term (current) use of aspirin: Secondary | ICD-10-CM | POA: Insufficient documentation

## 2021-06-14 DIAGNOSIS — I130 Hypertensive heart and chronic kidney disease with heart failure and stage 1 through stage 4 chronic kidney disease, or unspecified chronic kidney disease: Secondary | ICD-10-CM | POA: Insufficient documentation

## 2021-06-14 DIAGNOSIS — Z87891 Personal history of nicotine dependence: Secondary | ICD-10-CM | POA: Insufficient documentation

## 2021-06-14 DIAGNOSIS — N189 Chronic kidney disease, unspecified: Secondary | ICD-10-CM | POA: Insufficient documentation

## 2021-06-14 LAB — CBC WITH DIFFERENTIAL/PLATELET
Abs Immature Granulocytes: 0.01 10*3/uL (ref 0.00–0.07)
Basophils Absolute: 0 10*3/uL (ref 0.0–0.1)
Basophils Relative: 0 %
Eosinophils Absolute: 0.1 10*3/uL (ref 0.0–0.5)
Eosinophils Relative: 2 %
HCT: 44.1 % (ref 39.0–52.0)
Hemoglobin: 14.6 g/dL (ref 13.0–17.0)
Immature Granulocytes: 0 %
Lymphocytes Relative: 23 %
Lymphs Abs: 1.2 10*3/uL (ref 0.7–4.0)
MCH: 31.7 pg (ref 26.0–34.0)
MCHC: 33.1 g/dL (ref 30.0–36.0)
MCV: 95.7 fL (ref 80.0–100.0)
Monocytes Absolute: 0.5 10*3/uL (ref 0.1–1.0)
Monocytes Relative: 11 %
Neutro Abs: 3.2 10*3/uL (ref 1.7–7.7)
Neutrophils Relative %: 64 %
Platelets: 161 10*3/uL (ref 150–400)
RBC: 4.61 MIL/uL (ref 4.22–5.81)
RDW: 13 % (ref 11.5–15.5)
WBC: 5 10*3/uL (ref 4.0–10.5)
nRBC: 0 % (ref 0.0–0.2)

## 2021-06-14 LAB — COMPREHENSIVE METABOLIC PANEL
ALT: 74 U/L — ABNORMAL HIGH (ref 0–44)
AST: 35 U/L (ref 15–41)
Albumin: 3.5 g/dL (ref 3.5–5.0)
Alkaline Phosphatase: 71 U/L (ref 38–126)
Anion gap: 6 (ref 5–15)
BUN: 26 mg/dL — ABNORMAL HIGH (ref 8–23)
CO2: 26 mmol/L (ref 22–32)
Calcium: 9.1 mg/dL (ref 8.9–10.3)
Chloride: 105 mmol/L (ref 98–111)
Creatinine, Ser: 1.67 mg/dL — ABNORMAL HIGH (ref 0.61–1.24)
GFR, Estimated: 46 mL/min — ABNORMAL LOW (ref >60)
Glucose, Bld: 96 mg/dL (ref 70–99)
Potassium: 4.1 mmol/L (ref 3.5–5.1)
Sodium: 137 mmol/L (ref 135–145)
Total Bilirubin: 1.2 mg/dL (ref 0.3–1.2)
Total Protein: 5.7 g/dL — ABNORMAL LOW (ref 6.5–8.1)

## 2021-06-14 LAB — BRAIN NATRIURETIC PEPTIDE: B Natriuretic Peptide: 4024.4 pg/mL — ABNORMAL HIGH (ref 0.0–100.0)

## 2021-06-14 MED ORDER — FUROSEMIDE 10 MG/ML IJ SOLN
40.0000 mg | Freq: Once | INTRAMUSCULAR | Status: AC
  Administered 2021-06-14: 40 mg via INTRAVENOUS
  Filled 2021-06-14: qty 4

## 2021-06-14 NOTE — ED Notes (Signed)
bp 130.80 p88 r 20 po 97

## 2021-06-14 NOTE — ED Provider Notes (Signed)
[facility] EMERGENCY DEPARTMENT Provider Note   CSN: [id number] Arrival date & time: 06/14/21  1108     History No chief complaint on file.   Joel Beck is a 65 y.o. male.  HPI Patient is a 65 year old male with a history of CHF, CKD, who presents to the emergency department due to progressive leg swelling and shortness of breath.  States that he has been compliant with his Lasix but over the past 2 weeks his symptoms have continued to worsen and recently has started to produce less urine than normal when taking his Lasix.  States that shortness of breath worsens with exacerbation and reports additional orthopnea.  Denies any chest pain.  Patient states that he also recently had an injection to the abdomen and since then has had mild abdominal pain.  No other complaints.  Echocardiogram on Apr 30, 2021 shows an LVEF of 20 to 25%.    Past Medical History:  Diagnosis Date   CHF (congestive heart failure) ([place]) 07/05/2014   Hypertension Dx 2015    Patient Active Problem List   Diagnosis Date Noted   Acute on chronic systolic (congestive) heart failure ([place]) 04/29/2021   Chronic kidney disease 04/29/2021   Elevated troponin 04/29/2021   Acute exacerbation of congestive heart failure ([place]) 04/29/2021   Hypertension    Elevated serum creatinine 07/20/2015   Hypertensive heart disease with congestive heart failure ([place]) 06/29/2015   Onychomycosis [id number]   Chronic systolic CHF (congestive heart failure) ([place]) 07/05/2014   CHF (congestive heart failure) ([place]) 07/05/2014   Accelerated hypertension 07/04/2014   GERD (gastroesophageal reflux disease) 07/04/2014    Past Surgical History:  Procedure Laterality Date   LEFT HEART CATHETERIZATION WITH CORONARY ANGIOGRAM N/A 07/05/2014   Procedure: LEFT HEART CATHETERIZATION WITH CORONARY ANGIOGRAM;  Surgeon: Peter M Martinique, MD;  Location: [facility] CATH LAB;  Service: Cardiovascular;  Laterality: N/A;       Family  History  Problem Relation Age of Onset   Cancer Neg Hx    Heart disease Neg Hx     Social History   Tobacco Use   Smoking status: Former    Pack years: 0.00    Types: Cigarettes    Quit date: 12/03/1976    Years since quitting: 44.5   Smokeless tobacco: Never  Substance Use Topics   Alcohol use: Yes    Alcohol/week: 1.0 standard drink    Types: 1 Cans of beer per week    Comment: occ   Drug use: No    Home Medications Prior to Admission medications   Medication Sig Start Date End Date Taking? Authorizing Provider  aspirin EC 81 MG tablet Take 1 tablet (81 mg total) by mouth daily. 03/15/16   Funches, Adriana Mccallum, MD  carvedilol (COREG) 3.125 MG tablet Take 1 tablet (3.125 mg total) by mouth 2 (two) times daily. 04/30/21 04/30/22  Gifford Shave, MD  dapagliflozin propanediol (FARXIGA) 10 MG TABS tablet Take 1 tablet (10 mg total) by mouth daily before breakfast. 06/09/21   [place], Maricela Bo, FNP  furosemide (LASIX) 20 MG tablet Take 2 tablets (40 mg total) by mouth daily. Patient taking differently: Take 40 mg by mouth daily as needed for edema or fluid. 06/09/21 06/09/22  Rafael Bihari, FNP  potassium chloride SA (KLOR-CON M20) 20 MEQ tablet Take 2 tablets (40 mEq total) by mouth daily. Take 1 tablet on days that you take Lasix Patient taking differently: Take 20-40 mEq by mouth See admin instructions. Take  38mq daily, except take 242m on the days you take Furosemide. 06/09/21   [place], JeMaricela BoFNP  sacubitril-valsartan (ENTRESTO) 97-103 MG TAKE 1 TABLET BY MOUTH 2 (TWO) TIMES DAILY. MUST KEEP PENDING APPOINTMENT FOR FURTHER REFILLS Patient taking differently: Take 1 tablet by mouth 2 (two) times daily. 04/30/21 04/30/22  CrGifford ShaveMD  spironolactone (ALDACTONE) 25 MG tablet Take 0.5 tablets (12.5 mg total) by mouth daily. 04/30/21   CrGifford ShaveMD    Allergies    Ace inhibitors  Review of Systems   Review of Systems  All other systems reviewed and are  negative. Ten systems reviewed and are negative for acute change, except as noted in the HPI.   Physical Exam Updated Vital Signs BP (!) 124/99   Pulse 93   Temp 98.5 F (36.9 C)   Resp 18   SpO2 98%   Physical Exam Vitals and nursing note reviewed.  Constitutional:      General: He is not in acute distress.    Appearance: Normal appearance. He is not ill-appearing, toxic-appearing or diaphoretic.  HENT:     Head: Normocephalic and atraumatic.     Right Ear: External ear normal.     Left Ear: External ear normal.     Nose: Nose normal.     Mouth/Throat:     Mouth: Mucous membranes are moist.     Pharynx: Oropharynx is clear. No oropharyngeal exudate or posterior oropharyngeal erythema.  Eyes:     General: No scleral icterus.       Right eye: No discharge.        Left eye: No discharge.     Extraocular Movements: Extraocular movements intact.     Conjunctiva/sclera: Conjunctivae normal.  Cardiovascular:     Rate and Rhythm: Normal rate and regular rhythm.     Pulses: Normal pulses.     Heart sounds: Normal heart sounds. No murmur heard.   No friction rub. No gallop.  Pulmonary:     Effort: Pulmonary effort is normal. No respiratory distress.     Breath sounds: Normal breath sounds. No stridor. No wheezing, rhonchi or rales.  Abdominal:     General: Abdomen is flat.     Palpations: Abdomen is soft.     Tenderness: There is no abdominal tenderness.  Musculoskeletal:        General: Normal range of motion.     Cervical back: Normal range of motion and neck supple. No tenderness.     Right lower leg: Edema present.     Left lower leg: Edema present.     Comments: 1+ pitting edema noted in the bilateral lower extremities.  Skin:    General: Skin is warm and dry.  Neurological:     General: No focal deficit present.     Mental Status: He is alert and oriented to person, place, and time.  Psychiatric:        Mood and Affect: Mood normal.        Behavior: Behavior  normal.    ED Results / Procedures / Treatments   Labs (all labs ordered are listed, but only abnormal results are displayed) Labs Reviewed  COMPREHENSIVE METABOLIC PANEL - Abnormal; Notable for the following components:      Result Value   BUN 26 (*)    Creatinine, Ser 1.67 (*)    Total Protein 5.7 (*)    ALT 74 (*)    GFR, Estimated 46 (*)    All other components within  normal limits  BRAIN NATRIURETIC PEPTIDE - Abnormal; Notable for the following components:   B Natriuretic Peptide 4,024.4 (*)    All other components within normal limits  CBC WITH DIFFERENTIAL/PLATELET   EKG EKG Interpretation  Date/Time:  Wednesday June 14 2021 16:52:28 EDT Ventricular Rate:  88 PR Interval:  194 QRS Duration: 112 QT Interval:  376 QTC Calculation: 454 R Axis:   70 Text Interpretation: Normal sinus rhythm Possible Left atrial enlargement Incomplete left bundle branch block Left ventricular hypertrophy with repolarization abnormality ( Cornell product ) Abnormal ECG No significant change since last tracing Confirmed by Gareth Morgan [phone]) on 06/14/2021 4:57:20 PM  Radiology DG Chest 1 View  Result Date: 06/14/2021 CLINICAL DATA:  Shortness of breath EXAM: CHEST  1 VIEW COMPARISON:  06/13/2021 FINDINGS: Cardiomegaly with pulmonary vascular congestion. Atherosclerotic calcification of the aortic knob. No overt pulmonary edema. No focal airspace consolidation. No pleural effusion or pneumothorax. IMPRESSION: Cardiomegaly with pulmonary vascular congestion. Electronically Signed   By: Davina Poke D.O.   On: 06/14/2021 16:01   DG Chest 2 View  Result Date: 06/13/2021 CLINICAL DATA:  65 year old male with CHF. EXAM: CHEST - 2 VIEW COMPARISON:  Chest radiograph dated 05/27/2021. FINDINGS: There is cardiomegaly with mild central vascular congestion. No focal consolidation, pleural effusion, or pneumothorax. Atherosclerotic calcification of the aortic arch. No acute osseous pathology.  IMPRESSION: Cardiomegaly with mild central vascular congestion. Electronically Signed   By: Anner Crete M.D.   On: 06/13/2021 21:37    Procedures Procedures   Medications Ordered in ED Medications  furosemide (LASIX) injection 40 mg (40 mg Intravenous Given 06/14/21 1945)    ED Course  I have reviewed the triage vital signs and the nursing notes.  Pertinent labs & imaging results that were available during my care of the patient were reviewed by me and considered in my medical decision making (see chart for details).    MDM Rules/Calculators/A&P                          Pt is a 65 y.o. male who presents to the emergency department with shortness of breath, orthopnea, as well as leg swelling.  History of CHF.  Labs: CBC without abnormalities. CMP with a BUN of 26, creatinine of 1.67, total protein of 5.7, ALT of 74, GFR of 46.  Kidney function appears to be similar to patient's baseline. BNP of 4024.4.  Imaging: Chest x-ray shows cardiomegaly with mild central vascular congestion.  I, Rayna Sexton, PA-C, personally reviewed and evaluated these images and lab results as part of my medical decision-making.  Lab work appears similar to patient's baseline.  Given IV Lasix and states that he has urinated multiple times and is feeling much better than before.  He is ambulating throughout the emergency department without difficulty.  Denies any current shortness of breath.  He actually notes that recently he has been taking his medications with "a lot of water".  He did not realize that he should be limiting his fluid intake.  Recommended patient continue taking his Lasix, limit his fluid intake, and follow-up with cardiology.  Discussed return precautions.  Feel he is stable for discharge at this time and he is agreeable.  His questions were answered and he was amicable at the time of discharge.  Note: Portions of this report may have been transcribed using voice recognition  software. Every effort was made to ensure accuracy; however, inadvertent computerized transcription errors may  be present.   Final Clinical Impression(s) / ED Diagnoses Final diagnoses:  Acute on chronic congestive heart failure, unspecified heart failure type [facility])   Rx / DC Orders ED Discharge Orders     None        Rayna Sexton, PA-C 06/14/21 2033    Gareth Morgan, MD 06/15/21 1038

## 2021-06-14 NOTE — ED Triage Notes (Signed)
Patient here yesterday and left prior to being seen in treatment room. Patient here for ongoing feet and ankle swelling with SOB x 2 weeks. Patient denies cough, denies fever. NAD. Denies cp

## 2021-06-14 NOTE — Discharge Instructions (Signed)
Please follow-up with your cardiologist regarding your symptoms.  Please continue to take your Lasix as prescribed.  If you develop worsening chest pain, shortness of breath, please come back to the emergency department for reevaluation.  It was a pleasure to meet you.

## 2021-06-14 NOTE — ED Notes (Signed)
Pt being taken to c-t

## 2021-06-16 ENCOUNTER — Other Ambulatory Visit: Payer: Self-pay

## 2021-06-16 ENCOUNTER — Other Ambulatory Visit (HOSPITAL_COMMUNITY): Payer: Self-pay | Admitting: Family Medicine

## 2021-06-16 ENCOUNTER — Ambulatory Visit (HOSPITAL_COMMUNITY)
Admission: RE | Admit: 2021-06-16 | Discharge: 2021-06-16 | Disposition: A | Discharge status: Home / Self Care | Payer: Medicare (Managed Care) | Source: Ambulatory Visit | Attending: Internal Medicine | Admitting: Internal Medicine

## 2021-06-16 DIAGNOSIS — I5022 Chronic systolic (congestive) heart failure: Secondary | ICD-10-CM

## 2021-06-16 LAB — BASIC METABOLIC PANEL
Anion gap: 6 (ref 5–15)
BUN: 35 mg/dL — ABNORMAL HIGH (ref 8–23)
CO2: 23 mmol/L (ref 22–32)
Calcium: 8.6 mg/dL — ABNORMAL LOW (ref 8.9–10.3)
Chloride: 110 mmol/L (ref 98–111)
Creatinine, Ser: 1.78 mg/dL — ABNORMAL HIGH (ref 0.61–1.24)
GFR, Estimated: 42 mL/min — ABNORMAL LOW (ref >60)
Glucose, Bld: 182 mg/dL — ABNORMAL HIGH (ref 70–99)
Potassium: 4.5 mmol/L (ref 3.5–5.1)
Sodium: 139 mmol/L (ref 135–145)

## 2021-06-16 NOTE — Progress Notes (Signed)
Bmet 

## 2021-06-19 ENCOUNTER — Other Ambulatory Visit: Payer: Self-pay

## 2021-06-20 ENCOUNTER — Telehealth (HOSPITAL_COMMUNITY): Payer: Self-pay | Admitting: Pharmacy Technician

## 2021-06-20 ENCOUNTER — Other Ambulatory Visit (HOSPITAL_COMMUNITY): Payer: Self-pay

## 2021-06-20 NOTE — Telephone Encounter (Signed)
Advanced Heart Failure Patient Advocate Encounter  Patient was started on Farxiga during recent clinic visit. Upon investigation, patient's insurance requires Jardiance. Current 30 day co-pay, $47.  Called patient using interpreter services to inform him of change. Interpreter had to leave a vm. Encouraged the patient to call back so that we can sign him up for a PAN HF grant while it is still open.  Sent Jasmine, ([place]) a request to have the new RX sent to [facility].  Charlann Boxer, CPhT

## 2021-06-21 ENCOUNTER — Other Ambulatory Visit: Payer: Self-pay

## 2021-06-21 ENCOUNTER — Encounter (HOSPITAL_COMMUNITY): Payer: Self-pay | Admitting: Internal Medicine

## 2021-06-21 ENCOUNTER — Telehealth (HOSPITAL_COMMUNITY): Payer: Self-pay | Admitting: Cardiology

## 2021-06-21 ENCOUNTER — Ambulatory Visit (HOSPITAL_COMMUNITY)
Admission: RE | Admit: 2021-06-21 | Discharge: 2021-06-21 | Disposition: A | Discharge status: Home / Self Care | Payer: Medicare (Managed Care) | Source: Ambulatory Visit | Attending: Internal Medicine | Admitting: Internal Medicine

## 2021-06-21 VITALS — BP 108/60 | HR 96 | Wt 188.4 lb

## 2021-06-21 DIAGNOSIS — Z7984 Long term (current) use of oral hypoglycemic drugs: Secondary | ICD-10-CM | POA: Diagnosis not present

## 2021-06-21 DIAGNOSIS — I110 Hypertensive heart disease with heart failure: Secondary | ICD-10-CM | POA: Diagnosis present

## 2021-06-21 DIAGNOSIS — Z8249 Family history of ischemic heart disease and other diseases of the circulatory system: Secondary | ICD-10-CM | POA: Diagnosis not present

## 2021-06-21 DIAGNOSIS — Z87891 Personal history of nicotine dependence: Secondary | ICD-10-CM | POA: Insufficient documentation

## 2021-06-21 DIAGNOSIS — Z7982 Long term (current) use of aspirin: Secondary | ICD-10-CM | POA: Insufficient documentation

## 2021-06-21 DIAGNOSIS — Z79899 Other long term (current) drug therapy: Secondary | ICD-10-CM | POA: Diagnosis not present

## 2021-06-21 DIAGNOSIS — F40240 Claustrophobia: Secondary | ICD-10-CM | POA: Insufficient documentation

## 2021-06-21 DIAGNOSIS — I10 Essential (primary) hypertension: Secondary | ICD-10-CM | POA: Diagnosis not present

## 2021-06-21 DIAGNOSIS — I429 Cardiomyopathy, unspecified: Secondary | ICD-10-CM | POA: Insufficient documentation

## 2021-06-21 DIAGNOSIS — I428 Other cardiomyopathies: Secondary | ICD-10-CM | POA: Insufficient documentation

## 2021-06-21 DIAGNOSIS — I5023 Acute on chronic systolic (congestive) heart failure: Secondary | ICD-10-CM | POA: Insufficient documentation

## 2021-06-21 DIAGNOSIS — I5022 Chronic systolic (congestive) heart failure: Secondary | ICD-10-CM | POA: Diagnosis not present

## 2021-06-21 LAB — BASIC METABOLIC PANEL
Anion gap: 7 (ref 5–15)
BUN: 31 mg/dL — ABNORMAL HIGH (ref 8–23)
CO2: 24 mmol/L (ref 22–32)
Calcium: 8.9 mg/dL (ref 8.9–10.3)
Chloride: 110 mmol/L (ref 98–111)
Creatinine, Ser: 1.82 mg/dL — ABNORMAL HIGH (ref 0.61–1.24)
GFR, Estimated: 41 mL/min — ABNORMAL LOW (ref >60)
Glucose, Bld: 109 mg/dL — ABNORMAL HIGH (ref 70–99)
Potassium: 4.4 mmol/L (ref 3.5–5.1)
Sodium: 141 mmol/L (ref 135–145)

## 2021-06-21 LAB — BRAIN NATRIURETIC PEPTIDE: B Natriuretic Peptide: >4500 pg/mL — ABNORMAL HIGH (ref 0.0–100.0)

## 2021-06-21 MED ORDER — METOLAZONE 2.5 MG PO TABS
2.5000 mg | ORAL_TABLET | Freq: Every day | ORAL | 0 refills | Status: DC
  Filled 2021-06-21: qty 2, 2d supply, fill #0

## 2021-06-21 MED ORDER — TORSEMIDE 40 MG PO TABS
40.0000 mg | ORAL_TABLET | Freq: Every day | ORAL | 3 refills | Status: DC
  Filled 2021-06-21: qty 90, fill #0

## 2021-06-21 MED ORDER — TORSEMIDE 20 MG PO TABS
40.0000 mg | ORAL_TABLET | Freq: Every day | ORAL | 3 refills | Status: DC
  Filled 2021-06-21: qty 60, 30d supply, fill #0

## 2021-06-21 NOTE — Progress Notes (Addendum)
Advanced Heart Failure Clinic Note    Date:  06/21/2021   ID:  CAEDIN COCKING, DOB [birth year], MRN [id number]  PCP:  Arma Heading, MD  Cardiologist:   Dr. Johnsie Cancel HF Cardiologist: Dr. [name]   History of Present Illness: Joel Beck is a 65 y.o. male with NICM, HTN,  and chronic systolic heart failure diagnosed in 2015.    He returned for HF follow up 4/21. He did not bring his medications as requested at last visit but says he is taking all meds regularly. Says he feels great. Says he can walk 1 hour without problem. NYHA I symptoms.  Admitted 5/22 for A/CHF. Diuresed with IV lasix. Repeat echo EF 20-25%. Discharged on po lasix prn.   Today he returns for HF follow up. Here with interpretor. Seen in Clinic 2 weeks ago by Allena Katz NP. At that time, had not been seen in clinic since 4/21, recent hospitalization 5/22 for A/CHF. At that time ReDS 52%  was given IV lasix x 1. Lasix 40 mg po restarted. Says continues to have severe swelling. Medicines not working. + LE edema, orthopnea. SOB on walking up stairs or hills. Ab bloated.    Cardiac studies:  - Echo (8/15): EF 25%  - Echo (7/17): EF 20-25%  - Echo (6/18): EF 20-25% Grade II DD - Echo (6/19): EF 20-25%, Grade I DD - Echo (5/22): EF 20-25%, RV ok. - Cath by Dr Martinique 2015 with no CAD  Unable to tolerate cMRI due to claustrophobia.   -CPX Test (7/18): Peak VO2: 20.6 (74% predicted peak VO2) VE/VCO2 slope:  28 OUES: 2.55 Peak RER: 0.96 Submax test. Mildly reduced functional capacity. Mild chrontropic incompetence in setting of submax effort. No obvious HF or pulmonary limitation noted.   Review of systems complete and found to be negative unless listed in HPI.   Past Medical History:  Diagnosis Date   CHF (congestive heart failure) ([place]) 07/05/2014   Hypertension Dx 2015   Past Surgical History:  Procedure Laterality Date   LEFT HEART CATHETERIZATION WITH CORONARY ANGIOGRAM N/A 07/05/2014   Procedure:  LEFT HEART CATHETERIZATION WITH CORONARY ANGIOGRAM;  Surgeon: Peter M Martinique, MD;  Location: [facility] CATH LAB;  Service: Cardiovascular;  Laterality: N/A;   Current Outpatient Medications  Medication Sig Dispense Refill   aspirin EC 81 MG tablet Take 1 tablet (81 mg total) by mouth daily. 90 tablet 3   carvedilol (COREG) 3.125 MG tablet Take 1 tablet (3.125 mg total) by mouth 2 (two) times daily. 60 tablet 0   dapagliflozin propanediol (FARXIGA) 10 MG TABS tablet Take 1 tablet (10 mg total) by mouth daily before breakfast. 30 tablet 11   furosemide (LASIX) 20 MG tablet Take 2 tablets (40 mg total) by mouth daily. 30 tablet 11   potassium chloride SA (KLOR-CON M20) 20 MEQ tablet Take 2 tablets (40 mEq total) by mouth daily. Take 1 tablet on days that you take Lasix 60 tablet 2   sacubitril-valsartan (ENTRESTO) 97-103 MG TAKE 1 TABLET BY MOUTH 2 (TWO) TIMES DAILY. MUST KEEP PENDING APPOINTMENT FOR FURTHER REFILLS 60 tablet 1   spironolactone (ALDACTONE) 25 MG tablet Take 0.5 tablets (12.5 mg total) by mouth daily. 15 tablet 6   No current facility-administered medications for this encounter.   Allergies:   Ace inhibitors   Social History:  The patient  reports that he quit smoking about 44 years ago. His smoking use included cigarettes. He has never used smokeless tobacco.  He reports current alcohol use of about 1.0 standard drink of alcohol per week. He reports that he does not use drugs.   Family History:  Father had high blood pressure and diabetes. Mother had no cardiac problems. No other pertinent family history.   Review of systems complete and found to be negative unless listed in HPI.    BP 108/60   Pulse 96   Wt 85.5 kg (188 lb 6.4 oz)   SpO2 97%   BMI 27.03 kg/m   Wt Readings from Last 3 Encounters:  06/21/21 85.5 kg (188 lb 6.4 oz)  06/13/21 83.9 kg (185 lb)  06/09/21 84.4 kg (186 lb)    Physical Exam General:  Sitting on exam table  No resp difficulty HEENT: normal Neck:  supple. JVP to ear  Carotids 2+ bilat; no bruits. No lymphadenopathy or thryomegaly appreciated. Cor: PMI nondisplaced. Regular rate & rhythm. No rubs, gallops or murmurs. Lungs: clear Abdomen: soft, nontender, + distended. No hepatosplenomegaly. No bruits or masses. Good bowel sounds. Extremities: no cyanosis, clubbing, rash, 3+ edema Neuro: alert & orientedx3, cranial nerves grossly intact. moves all 4 extremities w/o difficulty. Affect pleasant    ASSESSMENT AND PLAN:  1.  Acute on chronic systolic HF: Due to NICM. Suspect HTN in nature. Cath without significant CAD 2015. EF has been down since 2015.  - LHC (2015) no CAD - Echo 05/2018 LVEF 20-25%, Grade 1 DD.  - Echo (5/22): EF 20-25% - NYHA III with significant volume overload.  - Switch lasix 40 daily to torsemide 40 daily - Add metolazone 2.5 + kcl 40 x 2 days only - He will contact us on Friday to see if he is making progress. If no progress, can get [facility] to give IV lasix.  - Add compression stockings - labs today  - f/u next week with NP/PA  - Continue Farxiga 10 mg daily. - Continue Entresto 97-103 mg bid. - Continue coreg 3.125 mg bid. Has not tolerated higher doses. - Continue spiro 12.5 mg daily. - Pt intolerant to cMRI with claustrophobia.  - CPX 06/2017 with suboptimal effort.  - Continues to refuse ICD - Has refused Paramed[facility] support. - He has had longstanding cardiomyopathy and I am worry he may be getting worse. Consider repeat CPX testing   2. HTN  - well controlled today   Discussed with patient and family via interpreter.   Total time spent 45 minutes. Over half that time spent discussing above.    Glori Bickers, MD  06/21/21

## 2021-06-21 NOTE — Patient Instructions (Addendum)
Labs done today. We will contact you only if your labs are abnormal.  STOP taking Lasix  START Torsemide '40mg'$  (2 tablets) by mouth daily.  START Metolazone 2.'5mg'$  (1 tablet) by mouth daily TODAY AND TOMORROW(PLEASE MAKE SURE YOU TAKE YOUR POTASSIUM BOTH DAYS)  No other medication changes were made. Please continue all current medications as prescribed.  Please wear your compression hose daily, place them on as soon as you get up in the morning and remove before you go to bed at night.YOUR WERE GIVEN A PRESCRIPTION FOR THIS IN OFFICE, YOU CAN ORDER THIS AT [facility] AT [address], [place], [place] [address] [phone]  Your physician recommends that you schedule a follow-up appointment in: 1 week;PLEASE CALL Friday July 22ND 2022 WITH AN UPDATE  If you have any questions or concerns before your next appointment please send [place] a message through [place] or call our office at [phone].    TO LEAVE A MESSAGE FOR THE NURSE SELECT OPTION 2, PLEASE LEAVE A MESSAGE INCLUDING: YOUR NAME DATE OF BIRTH CALL BACK NUMBER REASON FOR CALL**this is important as we prioritize the call backs  YOU WILL RECEIVE A CALL BACK THE SAME DAY AS LONG AS YOU CALL BEFORE 4:00 PM   Do the following things EVERYDAY: Weigh yourself in the morning before breakfast. Write it down and keep it in a log. Take your medicines as prescribed Eat low salt foods--Limit salt (sodium) to 2000 mg per day.  Stay as active as you can everyday Limit all fluids for the day to less than 2 liters   At the [facility], you and your health needs are our priority. As part of our continuing mission to provide you with exceptional heart care, we have created designated Provider Care Teams. These Care Teams include your primary Cardiologist (physician) and Advanced Practice Providers (APPs- Physician Assistants and Nurse Practitioners) who all work together to provide you with the care you need,  when you need it.   You may see any of the following providers on your designated Care Team at your next follow up: Dr Glori Bickers Dr Haynes Kerns, NP Lyda Jester, [place] Audry Riles, PharmD   Please be sure to bring in all your medications bottles to every appointment.  -

## 2021-06-23 ENCOUNTER — Telehealth (HOSPITAL_COMMUNITY): Payer: Self-pay | Admitting: *Deleted

## 2021-06-23 NOTE — Telephone Encounter (Signed)
Pt called stating he was supposed to call our office today with an update. Pt said he feels better but his feet are still swollen. Pt asked that I f/u with Dr.Bensimhon and see if he needs to make any med changes. I told pt I would call him after speaking with his provider. Also advised pt to use compression hose.

## 2021-06-26 NOTE — Telephone Encounter (Signed)
Left vm for return call

## 2021-06-28 NOTE — Telephone Encounter (Signed)
Opened in error

## 2021-06-29 ENCOUNTER — Encounter (HOSPITAL_COMMUNITY): Payer: Self-pay

## 2021-06-29 ENCOUNTER — Other Ambulatory Visit: Payer: Self-pay

## 2021-06-29 ENCOUNTER — Ambulatory Visit (HOSPITAL_COMMUNITY)
Admission: RE | Admit: 2021-06-29 | Discharge: 2021-06-29 | Disposition: A | Discharge status: Home / Self Care | Payer: Medicare (Managed Care) | Source: Ambulatory Visit | Attending: Adult Health | Admitting: Adult Health

## 2021-06-29 VITALS — BP 136/86 | HR 105 | Ht 66.0 in | Wt 174.0 lb

## 2021-06-29 DIAGNOSIS — Z8249 Family history of ischemic heart disease and other diseases of the circulatory system: Secondary | ICD-10-CM | POA: Diagnosis not present

## 2021-06-29 DIAGNOSIS — I10 Essential (primary) hypertension: Secondary | ICD-10-CM

## 2021-06-29 DIAGNOSIS — Z79899 Other long term (current) drug therapy: Secondary | ICD-10-CM | POA: Diagnosis not present

## 2021-06-29 DIAGNOSIS — Z87891 Personal history of nicotine dependence: Secondary | ICD-10-CM | POA: Insufficient documentation

## 2021-06-29 DIAGNOSIS — I428 Other cardiomyopathies: Secondary | ICD-10-CM | POA: Insufficient documentation

## 2021-06-29 DIAGNOSIS — F40240 Claustrophobia: Secondary | ICD-10-CM | POA: Insufficient documentation

## 2021-06-29 DIAGNOSIS — I5022 Chronic systolic (congestive) heart failure: Secondary | ICD-10-CM | POA: Diagnosis not present

## 2021-06-29 DIAGNOSIS — Z7982 Long term (current) use of aspirin: Secondary | ICD-10-CM | POA: Diagnosis not present

## 2021-06-29 DIAGNOSIS — I110 Hypertensive heart disease with heart failure: Secondary | ICD-10-CM | POA: Diagnosis not present

## 2021-06-29 LAB — BASIC METABOLIC PANEL
Anion gap: 5 (ref 5–15)
BUN: 30 mg/dL — ABNORMAL HIGH (ref 8–23)
CO2: 32 mmol/L (ref 22–32)
Calcium: 9 mg/dL (ref 8.9–10.3)
Chloride: 104 mmol/L (ref 98–111)
Creatinine, Ser: 1.75 mg/dL — ABNORMAL HIGH (ref 0.61–1.24)
GFR, Estimated: 43 mL/min — ABNORMAL LOW (ref >60)
Glucose, Bld: 118 mg/dL — ABNORMAL HIGH (ref 70–99)
Potassium: 3.8 mmol/L (ref 3.5–5.1)
Sodium: 141 mmol/L (ref 135–145)

## 2021-06-29 MED ORDER — TORSEMIDE 20 MG PO TABS
60.0000 mg | ORAL_TABLET | Freq: Every day | ORAL | 3 refills | Status: DC
  Filled 2021-06-29 – 2021-12-11 (×5): qty 180, 60d supply, fill #0
  Filled 2021-12-12: qty 90, 30d supply, fill #0
  Filled 2022-01-29: qty 90, 30d supply, fill #1

## 2021-06-29 MED ORDER — CARVEDILOL 3.125 MG PO TABS
3.1250 mg | ORAL_TABLET | Freq: Two times a day (BID) | ORAL | 3 refills | Status: DC
  Filled 2021-06-29: qty 60, 30d supply, fill #0
  Filled 2021-09-01: qty 60, 30d supply, fill #1

## 2021-06-29 MED ORDER — SACUBITRIL-VALSARTAN 97-103 MG PO TABS
ORAL_TABLET | ORAL | 3 refills | Status: DC
Start: 2021-06-29 — End: 2021-10-25
  Filled 2021-06-29: qty 60, 30d supply, fill #0
  Filled 2021-09-01: qty 60, 30d supply, fill #1

## 2021-06-29 NOTE — Progress Notes (Signed)
ReDS Vest / Clip - 06/29/21 1400       ReDS Vest / Clip   Station Marker C    Ruler Value 22    ReDS Value Range High volume overload    ReDS Actual Value 47

## 2021-06-29 NOTE — Addendum Note (Signed)
Encounter addended by: Conrad [place], NP on: 06/29/2021 7:13 PM  Actions taken: Follow-up modified, Level of Service modified

## 2021-06-29 NOTE — Progress Notes (Signed)
Advanced Heart Failure Clinic Note    Date:  06/29/2021   ID:  Joel Beck, DOB July 13, 1956, MRN [id number]  PCP:  Arma Heading, MD  Cardiologist:   Dr. Johnsie Cancel HF Cardiologist: Dr. [name]   History of Present Illness: Joel Beck is a 65 y.o. male with NICM, HTN,  and chronic systolic heart failure diagnosed in 2015.    He returned for HF follow up 4/21. He did not bring his medications as requested at last visit but says he is taking all meds regularly. Says he feels great. Says he can walk 1 hour without problem. NYHA I symptoms.  Admitted 5/22 for A/CHF. Diuresed with IV lasix. Repeat echo EF 20-25%. Discharged on po lasix prn.   Saw Dr [name] 06/21/21 and was volume overloaded. He was switched from lasix to torsemide 40 daily and instructed to take metolazone 2.5 mg for 2 days.   Today he returns for HF follow up with his wife. Interpreter was here for the visit but he asked them to leave. He declined use of any type of interpreter. Overall feeling much better.His wife also felt like he was much better.  Denies SOB/PND/Orthopnea. Appetite ok. No fever or chills. He has not been weighing at home. He ran out of carvedilol 2 days ago. Taking all medications.  Cardiac studies: - Echo (8/15): EF 25%  - Echo (7/17): EF 20-25%  - Echo (6/18): EF 20-25% Grade II DD - Echo (6/19): EF 20-25%, Grade I DD - Echo (5/22): EF 20-25%, RV ok. - Cath by Dr Martinique 2015 with no CAD  Unable to tolerate cMRI due to claustrophobia.   -CPX Test (7/18): Peak VO2: 20.6 (74% predicted peak VO2) VE/VCO2 slope:  28 OUES: 2.55 Peak RER: 0.96 Submax test. Mildly reduced functional capacity. Mild chrontropic incompetence in setting of submax effort. No obvious HF or pulmonary limitation noted.   Review of systems complete and found to be negative unless listed in HPI.   Past Medical History:  Diagnosis Date   CHF (congestive heart failure) ([place]) 07/05/2014   Hypertension Dx 2015   Past  Surgical History:  Procedure Laterality Date   LEFT HEART CATHETERIZATION WITH CORONARY ANGIOGRAM N/A 07/05/2014   Procedure: LEFT HEART CATHETERIZATION WITH CORONARY ANGIOGRAM;  Surgeon: Peter M Martinique, MD;  Location: [facility] CATH LAB;  Service: Cardiovascular;  Laterality: N/A;   Current Outpatient Medications  Medication Sig Dispense Refill   aspirin EC 81 MG tablet Take 1 tablet (81 mg total) by mouth daily. 90 tablet 3   carvedilol (COREG) 3.125 MG tablet Take 1 tablet (3.125 mg total) by mouth 2 (two) times daily. 60 tablet 0   dapagliflozin propanediol (FARXIGA) 10 MG TABS tablet Take 1 tablet (10 mg total) by mouth daily before breakfast. 30 tablet 11   potassium chloride SA (KLOR-CON M20) 20 MEQ tablet Take 2 tablets (40 mEq total) by mouth daily. Take 1 tablet on days that you take Lasix 60 tablet 2   sacubitril-valsartan (ENTRESTO) 97-103 MG TAKE 1 TABLET BY MOUTH 2 (TWO) TIMES DAILY. MUST KEEP PENDING APPOINTMENT FOR FURTHER REFILLS 60 tablet 1   spironolactone (ALDACTONE) 25 MG tablet Take 0.5 tablets (12.5 mg total) by mouth daily. 15 tablet 6   torsemide (DEMADEX) 20 MG tablet Take 2 tablets (40 mg total) by mouth daily. 180 tablet 3   metolazone (ZAROXOLYN) 2.5 MG tablet Take 1 tablet (2.5 mg total) by mouth daily for 2 days. 2 tablet 0  No current facility-administered medications for this encounter.   Allergies:   Ace inhibitors   Social History:  The patient  reports that he quit smoking about 44 years ago. His smoking use included cigarettes. He has never used smokeless tobacco. He reports current alcohol use of about 1.0 standard drink of alcohol per week. He reports that he does not use drugs.   Family History:  Father had high blood pressure and diabetes. Mother had no cardiac problems. No other pertinent family history.   Review of systems complete and found to be negative unless listed in HPI.   Vitals:   06/29/21 1414  BP: 136/86  Pulse: (!) 105  SpO2: 97%    BP  136/86   Pulse (!) 105   Ht '5\' 6"'$  (1.676 m)   Wt 78.9 kg (174 lb)   SpO2 97%   BMI 28.08 kg/m   Wt Readings from Last 3 Encounters:  06/29/21 78.9 kg (174 lb)  06/21/21 85.5 kg (188 lb 6.4 oz)  06/13/21 83.9 kg (185 lb)   Reds Clip 47%  Physical Exam General:  Well appearing. No resp difficulty HEENT: normal Neck: supple. JVP 10-11 . Carotids 2+ bilat; no bruits. No lymphadenopathy or thryomegaly appreciated. Cor: PMI nondisplaced. Regular rate & rhythm. No rubs, gallops or murmurs. Lungs: clear Abdomen: soft, nontender, nondistended. No hepatosplenomegaly. No bruits or masses. Good bowel sounds. Extremities: no cyanosis, clubbing, rash, edema Neuro: alert & orientedx3, cranial nerves grossly intact. moves all 4 extremities w/o difficulty. Affect pleasant   ASSESSMENT AND PLAN:  1.  chronic systolic HF: Due to NICM. Suspect HTN in nature. Cath without significant CAD 2015. EF has been down since 2015.  - LHC (2015) no CAD - Echo 05/2018 LVEF 20-25%, Grade 1 DD.  - Echo (5/22): EF 20-25% - NYHA II.Reds Clip 47%.  - On exam he does not look that overloaded but with Reds Clip 47% I will increase torsemide to 60 mg daily. -  Continue Farxiga 10 mg daily. - Continue Entresto 97-103 mg bid. Refilled entresto today.  - Restart coreg 3.125 mg twice a day. Refill today . He has not tolerated higher doses. - Continue spiro 12.5 mg daily. - Pt intolerant to cMRI with claustrophobia.  - CPX 06/2017 with suboptimal effort.  - Continues to refuse ICD - Has refused Paramedicine support. - He has had longstanding cardiomyopathy and I am worry he may be getting worse. Consider repeat CPX testing  - Check BMET  2. HTN  - Stable. As above restarting coreg.   Discussed purpose of medication change. Asked to weigh daily to follow weight trend at home.   Follow up in 4 weeks to reassess volume status. As noted he refused spanish interpreter.   Greater than 50% of the (total minutes 25) visit  spent in counseling/coordination of care regarding the above.     Darrick Grinder, NP  06/29/21

## 2021-06-29 NOTE — Patient Instructions (Signed)
Aumente la toma de Torsemida a 60 mg al da. Recargas enviadas para Entresto y Coreg a Engineer, mining. Trabajo de laboratorio realizado hoy: solo llamaremos con valores anormales. Cita de seguimiento segn lo programado el 23/07/2021 a las 11:00 a. m.

## 2021-07-04 ENCOUNTER — Encounter (HOSPITAL_COMMUNITY): Payer: Medicare (Managed Care)

## 2021-07-24 ENCOUNTER — Telehealth (HOSPITAL_COMMUNITY): Payer: Self-pay

## 2021-07-24 NOTE — Progress Notes (Signed)
Advanced Heart Failure Clinic Note    Date:  07/25/2021   ID:  Joel Beck, DOB 1956-03-19, MRN [id number]  PCP:  Joel Heading, MD  Cardiologist:   Dr. Johnsie Beck HF Cardiologist: Dr. Haroldine Beck   History of Present Illness: Joel Beck is a 65 y.o. male with NICM, HTN,  and chronic systolic heart failure diagnosed in 2015.    He returned for HF follow up 4/21. He did not bring his medications as requested at last visit but says he is taking all meds regularly. Says he feels great. Says he can walk 1 hour without problem. NYHA I symptoms.  Admitted 5/22 for A/CHF. Diuresed with IV lasix. Repeat echo EF 20-25%. Discharged on po lasix prn.   Saw Dr Joel Beck 06/21/21 and was volume overloaded. He was switched from lasix to torsemide 40 daily and instructed to take metolazone 2.5 mg for 2 days.   Today he returns for HF follow up. Here with interpretor. He has stopped his spironolactone for unclear reasons, and is now taking torsemide and lasix daily; he says this makes him feel better. Denies SOB, CP, dizziness, edema, or PND/Orthopnea. Drinking a lot of water when he is outside exercising.  Weight at home 177 pounds.   Cardiac studies: - Echo (8/15): EF 25%  - Echo (7/17): EF 20-25%  - Echo (6/18): EF 20-25% Grade II DD - Echo (6/19): EF 20-25%, Grade I DD - Echo (5/22): EF 20-25%, RV ok. - Cath by Dr Beck 2015 with no CAD  Unable to tolerate cMRI due to claustrophobia.   -CPX Test (7/18): Peak VO2: 20.6 (74% predicted peak VO2) VE/VCO2 slope:  28 OUES: 2.55 Peak RER: 0.96 Submax test. Mildly reduced functional capacity. Mild chrontropic incompetence in setting of submax effort. No obvious HF or pulmonary limitation noted.   Review of systems complete and found to be negative unless listed in HPI.   Past Medical History:  Diagnosis Date   CHF (congestive heart failure) ([place]) 07/05/2014   Hypertension Dx 2015   Past Surgical History:  Procedure Laterality Date   LEFT  HEART CATHETERIZATION WITH CORONARY ANGIOGRAM N/A 07/05/2014   Procedure: LEFT HEART CATHETERIZATION WITH CORONARY ANGIOGRAM;  Surgeon: Joel M Martinique, MD;  Location: [facility] CATH LAB;  Service: Cardiovascular;  Laterality: N/A;   Current Outpatient Medications  Medication Sig Dispense Refill   aspirin EC 81 MG tablet Take 1 tablet (81 mg total) by mouth daily. 90 tablet 3   carvedilol (COREG) 3.125 MG tablet Take 1 tablet (3.125 mg total) by mouth 2 (two) times daily. 60 tablet 3   dapagliflozin propanediol (FARXIGA) 10 MG TABS tablet Take 1 tablet (10 mg total) by mouth daily before breakfast. 30 tablet 11   furosemide (LASIX) 40 MG tablet Take 40 mg by mouth 2 (two) times daily.     sacubitril-valsartan (ENTRESTO) 97-103 MG TAKE 1 TABLET BY MOUTH 2 (TWO) TIMES DAILY. MUST KEEP PENDING APPOINTMENT FOR FURTHER REFILLS 60 tablet 3   torsemide (DEMADEX) 20 MG tablet Take 3 tablets (60 mg total) by mouth daily. 180 tablet 3   No current facility-administered medications for this encounter.   Allergies:   Ace inhibitors   Social History:  The patient  reports that he quit smoking about 44 years ago. His smoking use included cigarettes. He has never used smokeless tobacco. He reports current alcohol use of about 1.0 standard drink per week. He reports that he does not use drugs.   Family History:  Father had high blood pressure and diabetes. Mother had no cardiac problems. No other pertinent family history.   Review of systems complete and found to be negative unless listed in HPI.    BP 102/86   Pulse 98   Wt 80.6 kg (177 lb 12.8 oz)   SpO2 97%   BMI 28.70 kg/m   Wt Readings from Last 3 Encounters:  07/25/21 80.6 kg (177 lb 12.8 oz)  06/29/21 78.9 kg (174 lb)  06/21/21 85.5 kg (188 lb 6.4 oz)   Physical Exam General:  NAD. No resp difficulty HEENT: Normal Neck: Supple. JVP 10. Carotids 2+ bilat; no bruits. No lymphadenopathy or thryomegaly appreciated. Cor: PMI nondisplaced. Regular rate  & rhythm. No rubs, gallops or murmurs. Lungs: Clear Abdomen: Soft, nontender, nondistended. No hepatosplenomegaly. No bruits or masses. Good bowel sounds. Extremities: No cyanosis, clubbing, rash, edema Neuro: Alert & oriented x 3, cranial nerves grossly intact. Moves all 4 extremities w/o difficulty. Affect pleasant.  ReDs: 44%  ASSESSMENT AND PLAN:  1.  Chronic systolic HF: Due to NICM. Suspect HTN in nature. Cath without significant CAD 2015. EF has been down since 2015.  - LHC (2015) no CAD - Echo 05/2018 LVEF 20-25%, Grade 1 DD.  - Echo (5/22): EF 20-25% - NYHA II. Volume is up on exam, Reds Clip 44% - Stop lasix. - Restart spiro 12.5 mg daily. - Continue torsemide 60 mg daily. - Continue Farxiga 10 mg daily. - Continue Entresto 97-103 mg bid.  - Continue Coreg 3.125 mg bid. He has not tolerated higher doses. - Pt intolerant to cMRI with claustrophobia.  - CPX 06/2017 with suboptimal effort.  - Continues to refuse ICD. - He has had longstanding cardiomyopathy and I am worried he may be getting worse. We discussed repeating CPX testing, he is agreeable to this. - Check BMET & BNP today, repeat BMET in 10 days.   2. HTN  - Stable.  - Medications as above.  3. Noncompliance - Difficult situation with medication compliance.  - He is agreeable to Paramedicine support. Will place referral.  Follow up with APP in 4 weeks, and Dr. Haroldine Beck in 3 months.  [place], FNP  07/25/21 9:28 PM

## 2021-07-24 NOTE — Telephone Encounter (Signed)
Called to confirm/remind patient of their appointment at the [facility] on 07/25/21.I left a detailed message of patient's appointment time.  Patient reminded to bring all medications and/or complete list.  Confirmed patient has transportation. Gave directions, instructed to utilize [place] parking.  Confirmed appointment prior to ending call.

## 2021-07-25 ENCOUNTER — Ambulatory Visit (HOSPITAL_COMMUNITY)
Admission: RE | Admit: 2021-07-25 | Discharge: 2021-07-25 | Disposition: A | Discharge status: Home / Self Care | Payer: Medicare (Managed Care) | Source: Ambulatory Visit | Attending: Family Medicine | Admitting: Family Medicine

## 2021-07-25 ENCOUNTER — Other Ambulatory Visit: Payer: Self-pay

## 2021-07-25 ENCOUNTER — Encounter (HOSPITAL_COMMUNITY): Payer: Self-pay

## 2021-07-25 VITALS — BP 102/86 | HR 98 | Wt 177.8 lb

## 2021-07-25 DIAGNOSIS — Z79899 Other long term (current) drug therapy: Secondary | ICD-10-CM | POA: Insufficient documentation

## 2021-07-25 DIAGNOSIS — Z9119 Patient's noncompliance with other medical treatment and regimen: Secondary | ICD-10-CM | POA: Insufficient documentation

## 2021-07-25 DIAGNOSIS — I110 Hypertensive heart disease with heart failure: Secondary | ICD-10-CM | POA: Diagnosis not present

## 2021-07-25 DIAGNOSIS — I10 Essential (primary) hypertension: Secondary | ICD-10-CM | POA: Diagnosis not present

## 2021-07-25 DIAGNOSIS — Z7982 Long term (current) use of aspirin: Secondary | ICD-10-CM | POA: Diagnosis not present

## 2021-07-25 DIAGNOSIS — Z87891 Personal history of nicotine dependence: Secondary | ICD-10-CM | POA: Insufficient documentation

## 2021-07-25 DIAGNOSIS — Z9114 Patient's other noncompliance with medication regimen: Secondary | ICD-10-CM | POA: Diagnosis not present

## 2021-07-25 DIAGNOSIS — Z7984 Long term (current) use of oral hypoglycemic drugs: Secondary | ICD-10-CM | POA: Insufficient documentation

## 2021-07-25 DIAGNOSIS — I428 Other cardiomyopathies: Secondary | ICD-10-CM | POA: Insufficient documentation

## 2021-07-25 DIAGNOSIS — I5022 Chronic systolic (congestive) heart failure: Secondary | ICD-10-CM | POA: Insufficient documentation

## 2021-07-25 LAB — BASIC METABOLIC PANEL
Anion gap: 8 (ref 5–15)
BUN: 22 mg/dL (ref 8–23)
CO2: 28 mmol/L (ref 22–32)
Calcium: 8.7 mg/dL — ABNORMAL LOW (ref 8.9–10.3)
Chloride: 104 mmol/L (ref 98–111)
Creatinine, Ser: 1.48 mg/dL — ABNORMAL HIGH (ref 0.61–1.24)
GFR, Estimated: 53 mL/min — ABNORMAL LOW (ref >60)
Glucose, Bld: 90 mg/dL (ref 70–99)
Potassium: 3.8 mmol/L (ref 3.5–5.1)
Sodium: 140 mmol/L (ref 135–145)

## 2021-07-25 LAB — BRAIN NATRIURETIC PEPTIDE: B Natriuretic Peptide: 3438.4 pg/mL — ABNORMAL HIGH (ref 0.0–100.0)

## 2021-07-25 MED ORDER — SPIRONOLACTONE 25 MG PO TABS
12.5000 mg | ORAL_TABLET | Freq: Every day | ORAL | 3 refills | Status: DC
  Filled 2021-07-25 – 2021-08-16 (×2): qty 45, 90d supply, fill #0

## 2021-07-25 MED ORDER — SPIRONOLACTONE 25 MG PO TABS
25.0000 mg | ORAL_TABLET | Freq: Every day | ORAL | 3 refills | Status: DC
  Filled 2021-07-25: qty 90, 90d supply, fill #0

## 2021-07-25 NOTE — Patient Instructions (Addendum)
  REINICIA Spironolactona 12.5 (media pestaa) diariamente CONTINUAR Torsemida 60 mg diarios DETENER furosemida  laboratorios hoy Solo nos comunicaremos con usted si algo vuelve anormal o si necesitamos hacer algunos cambios. De lo contrario, ninguna noticia es una buena noticia.  Laboratorios necesarios en 7-10 das  Su mdico le ha recomendado que se realice una prueba de esfuerzo cardiopulmonar (CPX). La prueba CPX es una medicin no invasiva de la funcin cardaca y pulmonar. Reemplaza una prueba de esfuerzo tradicional en cinta rodante. Este tipo de prueba proporciona una gran cantidad de informacin que se relaciona no solo con su condicin actual sino tambin con los [place] futuros. Esta prueba combina mediciones de su ventilacin, intercambio de gases respiratorios en los pulmones, electrocardiograma (EKG), presin arterial y respuesta fsica antes, durante y despus de un protocolo de ejercicio.  Su mdico le recomienda Psychiatrist cita de seguimiento en: 4 semanas en la [facility] ([facility]) y en 3 meses con el Dr. [name]                        RESTART Spironolactone 12.5 (one half tab) daily CONTINUE Torsemide 60 mg daily STOP Furosemide  Labs today We will only contact you if something comes back abnormal or we need to make some changes. Otherwise no news is good news!  Labs needed in 7-10 days  Your physician has recommended that you have a cardiopulmonary stress test (CPX). CPX testing is a non-invasive measurement of heart and lung function. It replaces a traditional treadmill stress test. This type of test provides a tremendous amount of information that relates not only to your present condition but also for future outcomes. This test combines measurements of you ventilation, respiratory gas exchange in the lungs, electrocardiogram (EKG), blood pressure and physical response before, during, and following an exercise  protocol.  Your physician recommends that you schedule a follow-up appointment in: 4 weeks  in the Advanced Practitioners (PA/NP) Clinic and in 3 months with Dr [name]

## 2021-07-25 NOTE — Progress Notes (Signed)
ReDS Vest / Clip - 07/25/21 1100       ReDS Vest / Clip   Station Marker C    Ruler Value 28    ReDS Value Range High volume overload    ReDS Actual Value 44    Anatomical Comments sitting

## 2021-07-26 ENCOUNTER — Other Ambulatory Visit: Payer: Self-pay

## 2021-08-01 ENCOUNTER — Other Ambulatory Visit: Payer: Self-pay

## 2021-08-02 ENCOUNTER — Other Ambulatory Visit: Payer: Self-pay

## 2021-08-04 ENCOUNTER — Other Ambulatory Visit (HOSPITAL_COMMUNITY): Payer: Medicare (Managed Care)

## 2021-08-07 ENCOUNTER — Telehealth (HOSPITAL_COMMUNITY): Payer: Self-pay

## 2021-08-07 NOTE — Telephone Encounter (Signed)
Pt referred to paramedicine, my partner zack was unable to meet him last Friday in clinic to sch home visit for me this week due to illness.  He was going to meet him there with the interpreter due to the lang barrier.   I sent him a text, hoping it goes through to him in [place], and he will respond-if not I will look to contact interpreter from the hospital to call for me to sch home visit.   Marylouise Stacks, EMT-Paramedic  08/07/21

## 2021-08-08 ENCOUNTER — Encounter (HOSPITAL_COMMUNITY): Payer: Medicare (Managed Care)

## 2021-08-09 ENCOUNTER — Other Ambulatory Visit: Payer: Self-pay

## 2021-08-10 ENCOUNTER — Other Ambulatory Visit (HOSPITAL_COMMUNITY): Payer: Self-pay | Admitting: *Deleted

## 2021-08-10 ENCOUNTER — Other Ambulatory Visit (HOSPITAL_COMMUNITY): Payer: Self-pay | Admitting: Internal Medicine

## 2021-08-10 ENCOUNTER — Telehealth (HOSPITAL_COMMUNITY): Payer: Self-pay

## 2021-08-10 ENCOUNTER — Other Ambulatory Visit: Payer: Self-pay

## 2021-08-10 MED ORDER — EMPAGLIFLOZIN 10 MG PO TABS
10.0000 mg | ORAL_TABLET | Freq: Every day | ORAL | 3 refills | Status: DC
  Filled 2021-08-10: qty 30, 30d supply, fill #0

## 2021-08-10 NOTE — Telephone Encounter (Signed)
I utilized the lang translation line to assist with making phone call to reach out to sch home visit.  He was agreeable to next tues around 11.  Will see him then.   Marylouise Stacks, EMT-Paramedic  08/10/21

## 2021-08-15 ENCOUNTER — Other Ambulatory Visit (HOSPITAL_COMMUNITY): Payer: Self-pay

## 2021-08-15 NOTE — Progress Notes (Signed)
Came out today for our appointment time and there was no answer at the door.  Will reach out later in the week with translation line.   Marylouise Stacks, EMT-Paramedic  08/15/21

## 2021-08-16 ENCOUNTER — Telehealth (HOSPITAL_COMMUNITY): Payer: Self-pay

## 2021-08-16 ENCOUNTER — Other Ambulatory Visit: Payer: Self-pay

## 2021-08-16 NOTE — Telephone Encounter (Signed)
Attempted to reach pt again today, used translation services to assist, no answer on the phone.   He has clinic appoint next week so will try to meet him there.   Marylouise Stacks, EMT-Paramedic  08/16/21

## 2021-08-17 ENCOUNTER — Other Ambulatory Visit: Payer: Self-pay

## 2021-08-19 NOTE — Progress Notes (Signed)
Advanced Heart Failure Clinic Note    Date:  08/22/2021   ID:  Joel Beck, DOB 1956-10-20, MRN [id number]  PCP:  Arma Heading, MD  Cardiologist:   Dr. Johnsie Cancel HF Cardiologist: Dr. [name]   History of Present Illness: Joel Beck is a 65 y.o. male with NICM, HTN,  and chronic systolic heart failure diagnosed in 2015.    He returned for HF follow up 4/21. He did not bring his medications as requested at last visit but says he is taking all meds regularly. Says he feels great. Says he can walk 1 hour without problem. NYHA I symptoms.  Admitted 5/22 for A/CHF. Diuresed with IV lasix. Repeat echo EF 20-25%. Discharged on po lasix prn.   Saw Dr [name] 06/21/21 and was volume overloaded. He was switched from lasix to torsemide 40 daily and instructed to take metolazone 2.5 mg for 2 days.   Returned for HF follow up 8/22 and was taking lasix and torsemide daily and had stopped his spiro. He was referred to paramedicine to help with medications.  Today he returns for HF follow up with Paramedicine. He has been trying to use homeopathic root teas at home, he does not know what ingredients are in these teas but tells me they do not interfere with his medications. He is not significantly short of breath but has noticed more swelling. Denies CP, dizziness, or PND/Orthopnea. Appetite ok. No fever or chills. Weight at home 178 pounds. Taking all medications. His wife is not cooking and he is eating out a lot.  Cardiac studies: - Echo (8/15): EF 25%  - Echo (7/17): EF 20-25%  - Echo (6/18): EF 20-25% Grade II DD - Echo (6/19): EF 20-25%, Grade I DD - Echo (5/22): EF 20-25%, RV ok. - Cath by Dr Martinique 2015 with no CAD  Unable to tolerate cMRI due to claustrophobia.   -CPX Test (7/18): Peak VO2: 20.6 (74% predicted peak VO2) VE/VCO2 slope:  28 OUES: 2.55 Peak RER: 0.96 Submax test. Mildly reduced functional capacity. Mild chrontropic incompetence in setting of submax effort. No  obvious HF or pulmonary limitation noted.   Review of systems complete and found to be negative unless listed in HPI.   Past Medical History:  Diagnosis Date   CHF (congestive heart failure) ([place]) 07/05/2014   Hypertension Dx 2015   Past Surgical History:  Procedure Laterality Date   LEFT HEART CATHETERIZATION WITH CORONARY ANGIOGRAM N/A 07/05/2014   Procedure: LEFT HEART CATHETERIZATION WITH CORONARY ANGIOGRAM;  Surgeon: Peter M Martinique, MD;  Location: [facility] CATH LAB;  Service: Cardiovascular;  Laterality: N/A;   Current Outpatient Medications  Medication Sig Dispense Refill   aspirin EC 81 MG tablet Take 1 tablet (81 mg total) by mouth daily. 90 tablet 3   carvedilol (COREG) 3.125 MG tablet Take 1 tablet (3.125 mg total) by mouth 2 (two) times daily. 60 tablet 3   empagliflozin (JARDIANCE) 10 MG TABS tablet Take 1 tablet (10 mg total) by mouth daily before breakfast. 30 tablet 3   sacubitril-valsartan (ENTRESTO) 97-103 MG TAKE 1 TABLET BY MOUTH 2 (TWO) TIMES DAILY. MUST KEEP PENDING APPOINTMENT FOR FURTHER REFILLS 60 tablet 3   spironolactone (ALDACTONE) 25 MG tablet Take 0.5 tablets (12.5 mg total) by mouth daily. 45 tablet 3   torsemide (DEMADEX) 20 MG tablet Take 3 tablets (60 mg total) by mouth daily. 180 tablet 3   No current facility-administered medications for this encounter.   Allergies:  Ace inhibitors   Social History:  The patient  reports that he quit smoking about 44 years ago. His smoking use included cigarettes. He has never used smokeless tobacco. He reports current alcohol use of about 1.0 standard drink per week. He reports that he does not use drugs.   Family History:  Father had high blood pressure and diabetes. Mother had no cardiac problems. No other pertinent family history.   Review of systems complete and found to be negative unless listed in HPI.    BP 100/78   Pulse 88   Wt 80.9 kg (178 lb 6.4 oz)   SpO2 98%   BMI 28.79 kg/m   Wt Readings from Last 3  Encounters:  08/22/21 80.9 kg (178 lb 6.4 oz)  07/25/21 80.6 kg (177 lb 12.8 oz)  06/29/21 78.9 kg (174 lb)   Physical Exam General:  NAD. No resp difficulty HEENT: Normal Neck: Supple. JVP 10. Carotids 2+ bilat; no bruits. No lymphadenopathy or thryomegaly appreciated. Cor: PMI nondisplaced. Irregular rate & rhythm. No rubs, gallops or murmurs. Lungs: Clear Abdomen: Soft, nontender, nondistended. No hepatosplenomegaly. No bruits or masses. Good bowel sounds. Extremities: No cyanosis, clubbing, rash, 1-2+ BLE edema Neuro: Alert & oriented x 3, cranial nerves grossly intact. Moves all 4 extremities w/o difficulty. Affect pleasant.  ReDs: 54%  ECG: SR with PVCs (personally reviewed).  ASSESSMENT AND PLAN:  1.  Acute on chronic systolic HF:  - Due to NICM. Suspect HTN in nature. Cath without significant CAD 2015. EF has been down since 2015.  - LHC (2015) no CAD - Echo 05/2018 LVEF 20-25%, Grade 1 DD.  - Echo (5/22): EF 20-25% - NYHA II. Volume is up on exam although not markedly symptomatic, Reds Clip 54%. - Take metolazone 2.5 mg + 40 KCl today and tomorrow. - Continue torsemide 60 mg daily.  - Continue spiro 12.5 mg daily. - Continue Jardiance 10 mg daily. - Continue Entresto 97-103 mg bid.  - Continue Coreg 3.125 mg bid. He has not tolerated higher doses. - Pt intolerant to cMRI with claustrophobia.  - CPX 06/2017 with suboptimal effort.  - Continues to refuse ICD. - He has had longstanding cardiomyopathy and I am worried he may be getting worse. We discussed repeating CPX testing at last visit.Will wait until he is better diuresed to schedule. - I have asked him to limit is tea/fluids to less than 2 L/day and to try and limit salt intake. - Check BMET & BNP today, repeat BMET in 10 days.   2. HTN  - Stable.  - Medications as above.  3. Noncompliance - Difficult situation with medication compliance.  - He is now followed by Paramedicine. Greatly appreciate their  assistance.  Follow up with APP in 3 weeks, and Dr. [name] in 2 months.  [place], FNP  08/22/21 2:35 PM

## 2021-08-21 ENCOUNTER — Telehealth (HOSPITAL_COMMUNITY): Payer: Self-pay

## 2021-08-21 NOTE — Telephone Encounter (Signed)
Called patient to confirm/remind patient of their appointment at the [facility] on 08/22/21. However, I did leave a voicemail for patient to give our office a call in reference to his appointment for tomorrow.

## 2021-08-22 ENCOUNTER — Other Ambulatory Visit: Payer: Self-pay

## 2021-08-22 ENCOUNTER — Encounter (HOSPITAL_COMMUNITY): Payer: Self-pay

## 2021-08-22 ENCOUNTER — Other Ambulatory Visit (HOSPITAL_COMMUNITY): Payer: Self-pay

## 2021-08-22 ENCOUNTER — Ambulatory Visit (HOSPITAL_COMMUNITY)
Admission: RE | Admit: 2021-08-22 | Discharge: 2021-08-22 | Disposition: A | Discharge status: Home / Self Care | Payer: Medicare (Managed Care) | Source: Ambulatory Visit | Attending: Family Medicine | Admitting: Family Medicine

## 2021-08-22 VITALS — BP 100/78 | HR 88 | Wt 178.4 lb

## 2021-08-22 DIAGNOSIS — Z87891 Personal history of nicotine dependence: Secondary | ICD-10-CM | POA: Diagnosis not present

## 2021-08-22 DIAGNOSIS — I10 Essential (primary) hypertension: Secondary | ICD-10-CM | POA: Diagnosis not present

## 2021-08-22 DIAGNOSIS — Z79899 Other long term (current) drug therapy: Secondary | ICD-10-CM | POA: Diagnosis not present

## 2021-08-22 DIAGNOSIS — Z7901 Long term (current) use of anticoagulants: Secondary | ICD-10-CM | POA: Diagnosis not present

## 2021-08-22 DIAGNOSIS — I5022 Chronic systolic (congestive) heart failure: Secondary | ICD-10-CM | POA: Diagnosis not present

## 2021-08-22 DIAGNOSIS — Z9119 Patient's noncompliance with other medical treatment and regimen: Secondary | ICD-10-CM | POA: Insufficient documentation

## 2021-08-22 DIAGNOSIS — I5023 Acute on chronic systolic (congestive) heart failure: Secondary | ICD-10-CM

## 2021-08-22 DIAGNOSIS — I428 Other cardiomyopathies: Secondary | ICD-10-CM | POA: Insufficient documentation

## 2021-08-22 DIAGNOSIS — Z7984 Long term (current) use of oral hypoglycemic drugs: Secondary | ICD-10-CM | POA: Diagnosis not present

## 2021-08-22 DIAGNOSIS — M7989 Other specified soft tissue disorders: Secondary | ICD-10-CM | POA: Insufficient documentation

## 2021-08-22 DIAGNOSIS — I110 Hypertensive heart disease with heart failure: Secondary | ICD-10-CM | POA: Diagnosis not present

## 2021-08-22 DIAGNOSIS — Z09 Encounter for follow-up examination after completed treatment for conditions other than malignant neoplasm: Secondary | ICD-10-CM | POA: Diagnosis not present

## 2021-08-22 DIAGNOSIS — Z7982 Long term (current) use of aspirin: Secondary | ICD-10-CM | POA: Diagnosis not present

## 2021-08-22 DIAGNOSIS — Z9114 Patient's other noncompliance with medication regimen: Secondary | ICD-10-CM

## 2021-08-22 LAB — BRAIN NATRIURETIC PEPTIDE: B Natriuretic Peptide: >4500 pg/mL — ABNORMAL HIGH (ref 0.0–100.0)

## 2021-08-22 LAB — BASIC METABOLIC PANEL
Anion gap: 9 (ref 5–15)
BUN: 24 mg/dL — ABNORMAL HIGH (ref 8–23)
CO2: 28 mmol/L (ref 22–32)
Calcium: 8.7 mg/dL — ABNORMAL LOW (ref 8.9–10.3)
Chloride: 103 mmol/L (ref 98–111)
Creatinine, Ser: 1.78 mg/dL — ABNORMAL HIGH (ref 0.61–1.24)
GFR, Estimated: 42 mL/min — ABNORMAL LOW (ref >60)
Glucose, Bld: 104 mg/dL — ABNORMAL HIGH (ref 70–99)
Potassium: 3.7 mmol/L (ref 3.5–5.1)
Sodium: 140 mmol/L (ref 135–145)

## 2021-08-22 MED ORDER — POTASSIUM CHLORIDE CRYS ER 20 MEQ PO TBCR
20.0000 meq | EXTENDED_RELEASE_TABLET | ORAL | 0 refills | Status: DC
  Filled 2021-08-22: qty 4, 2d supply, fill #0

## 2021-08-22 MED ORDER — METOLAZONE 2.5 MG PO TABS
2.5000 mg | ORAL_TABLET | Freq: Every day | ORAL | 0 refills | Status: DC
  Filled 2021-08-22: qty 2, 2d supply, fill #0

## 2021-08-22 NOTE — Progress Notes (Signed)
ReDS Vest / Clip - 08/22/21 1400       ReDS Vest / Clip   Station Marker C    Ruler Value 25.5    ReDS Value Range High volume overload    ReDS Actual Value 54

## 2021-08-22 NOTE — Patient Instructions (Signed)
Labs were done today, if any labs are abnormal the clinic will call you  TAKE Metolazone 2.5 mg 1 tablet daily for 2 days ONLY TAKE Potassium 40 meq 2 tablets daily for 2 days ONLY  Your physician recommends that you schedule a follow-up appointment in: 3 weeks   At the [facility], you and your health needs are our priority. As part of our continuing mission to provide you with exceptional heart care, we have created designated Provider Care Teams. These Care Teams include your primary Cardiologist (physician) and Advanced Practice Providers (APPs- Physician Assistants and Nurse Practitioners) who all work together to provide you with the care you need, when you need it.   You may see any of the following providers on your designated Care Team at your next follow up: Dr Glori Bickers Dr Loralie Champagne Dr Patrice Paradise, NP Lyda Jester, [place] Ginnie Smart Audry Riles, PharmD   Please be sure to bring in all your medications bottles to every appointment.    If you have any questions or concerns before your next appointment please send [place] a message through [place] or call our office at [phone].    TO LEAVE A MESSAGE FOR THE NURSE SELECT OPTION 2, PLEASE LEAVE A MESSAGE INCLUDING: YOUR NAME DATE OF BIRTH CALL BACK NUMBER REASON FOR CALL**this is important as we prioritize the call backs  YOU WILL RECEIVE A CALL BACK THE SAME DAY AS LONG AS YOU CALL BEFORE 4:00 PM

## 2021-08-22 NOTE — Progress Notes (Signed)
Paramedicine Encounter   Patient ID: Joel Beck , male,   DOB: [birth year],64 y.o.,  MRN: [id number]   Met patient in clinic today with provider.  Weight @ clinic-178 B/p-100/78 P-88 Sp02-98 REDS clip-54%  First meeting with the pt-he was not home when we sch visit last week- He agreed to home visits and would answer if I called.  He reports he does not want to take meds and wants more homeopathic therapies to address his health concerns-he is taking things at home associated with "roots"  that are teas he drinks.  He said he had normal BM but now he has loose stools.  Lots of swelling to his stomach.  He does eat a lot of fast foods. His wife doesn't like/have time to cook.  He is to take metolazone today and tomor with 1 extra potassium- He goes to [employer].  I will call him on Thursday to check on his weights.    Marylouise Stacks, [place] 08/22/2021

## 2021-08-24 ENCOUNTER — Telehealth (HOSPITAL_COMMUNITY): Payer: Self-pay

## 2021-08-24 NOTE — Telephone Encounter (Signed)
Contacted pt regarding med changes the other day. He report taking the metolazone for the pat 2 day, he report weight the same at 178. He denies complaints.  I will see him next Monday. He said he would be at home.   Marylouise Stacks, EMT-Paramedic  08/24/21

## 2021-08-29 ENCOUNTER — Telehealth (HOSPITAL_COMMUNITY): Payer: Self-pay

## 2021-08-29 NOTE — Telephone Encounter (Signed)
I called pt prior to being en route to his place at our scheduled time b/c last time I arrived at our sch time he was not at home.  Pt is not home again, he said he wouldn't be home until 8pm and asked I could call tomor.   Marylouise Stacks, EMT-Paramedic  08/29/21

## 2021-09-01 ENCOUNTER — Other Ambulatory Visit (HOSPITAL_COMMUNITY): Payer: Medicare (Managed Care)

## 2021-09-01 ENCOUNTER — Other Ambulatory Visit: Payer: Self-pay

## 2021-09-05 ENCOUNTER — Encounter (HOSPITAL_COMMUNITY): Payer: Self-pay

## 2021-09-05 NOTE — Progress Notes (Signed)
Patient is now discharged from [facility].  Patient has/has not met the following goals:  Pending :Patient expresses basic understanding of medications and what they are for Yes :Patient able to verbalize heart failure specific dietary/fluid restrictions Pending :Patient is aware of who to call if they have medical concerns or if they need to schedule or change appts Pending :Patient has a scale for daily weights and weighs regularly Yes :Patient able to verbalize concerning symptoms when they should call the HF clinic (weight gain ranges, etc) Yes :Patient has a PCP and has seen within the past year or has upcoming appt Yes :Patient has reliable access to getting their medications Pending :Patient has shown they are able to reorder medications reliably No :Patient has had admission in past 30 days- if yes how many? No :Patient has had admission in past 90 days- if yes how many? He has had 3 ER visits.   Discharge Comments:  Only was able to meet pt in the clinic a few wks ago.  He has not been home for our 2 scheduled visits, he was a no show to his lab appointment last week.  Spoke to amy and she is ok to d/c due to pt not keeping appointments.   Marylouise Stacks, EMT-Paramedic  09/05/21

## 2021-09-12 ENCOUNTER — Telehealth (HOSPITAL_COMMUNITY): Payer: Self-pay

## 2021-09-12 NOTE — Telephone Encounter (Signed)
Called and left patient a detailed message of the annotation below to confirm/remind patient of their appointment at the [facility] on 09/13/21.   Patient was also reminded on the voice message to bring all medications and/or complete list.

## 2021-09-13 ENCOUNTER — Encounter (HOSPITAL_COMMUNITY): Payer: Medicare (Managed Care)

## 2021-10-25 ENCOUNTER — Ambulatory Visit (HOSPITAL_COMMUNITY)
Admission: RE | Admit: 2021-10-25 | Discharge: 2021-10-25 | Disposition: A | Discharge status: Home / Self Care | Payer: Medicare (Managed Care) | Source: Ambulatory Visit | Attending: Internal Medicine | Admitting: Internal Medicine

## 2021-10-25 ENCOUNTER — Other Ambulatory Visit: Payer: Self-pay

## 2021-10-25 VITALS — BP 115/75 | HR 98 | Wt 181.0 lb

## 2021-10-25 DIAGNOSIS — Z91199 Patient's noncompliance with other medical treatment and regimen due to unspecified reason: Secondary | ICD-10-CM | POA: Insufficient documentation

## 2021-10-25 DIAGNOSIS — I428 Other cardiomyopathies: Secondary | ICD-10-CM | POA: Diagnosis not present

## 2021-10-25 DIAGNOSIS — I5022 Chronic systolic (congestive) heart failure: Secondary | ICD-10-CM | POA: Diagnosis not present

## 2021-10-25 DIAGNOSIS — I110 Hypertensive heart disease with heart failure: Secondary | ICD-10-CM | POA: Insufficient documentation

## 2021-10-25 DIAGNOSIS — Z79899 Other long term (current) drug therapy: Secondary | ICD-10-CM | POA: Diagnosis not present

## 2021-10-25 DIAGNOSIS — Z7984 Long term (current) use of oral hypoglycemic drugs: Secondary | ICD-10-CM | POA: Diagnosis not present

## 2021-10-25 DIAGNOSIS — F40240 Claustrophobia: Secondary | ICD-10-CM | POA: Insufficient documentation

## 2021-10-25 DIAGNOSIS — I10 Essential (primary) hypertension: Secondary | ICD-10-CM

## 2021-10-25 DIAGNOSIS — Z9114 Patient's other noncompliance with medication regimen: Secondary | ICD-10-CM | POA: Diagnosis not present

## 2021-10-25 LAB — BRAIN NATRIURETIC PEPTIDE: B Natriuretic Peptide: 3900.5 pg/mL — ABNORMAL HIGH (ref 0.0–100.0)

## 2021-10-25 LAB — BASIC METABOLIC PANEL
Anion gap: 7 (ref 5–15)
BUN: 22 mg/dL (ref 8–23)
CO2: 30 mmol/L (ref 22–32)
Calcium: 8.7 mg/dL — ABNORMAL LOW (ref 8.9–10.3)
Chloride: 103 mmol/L (ref 98–111)
Creatinine, Ser: 1.81 mg/dL — ABNORMAL HIGH (ref 0.61–1.24)
GFR, Estimated: 41 mL/min — ABNORMAL LOW (ref >60)
Glucose, Bld: 95 mg/dL (ref 70–99)
Potassium: 3.3 mmol/L — ABNORMAL LOW (ref 3.5–5.1)
Sodium: 140 mmol/L (ref 135–145)

## 2021-10-25 MED ORDER — SACUBITRIL-VALSARTAN 97-103 MG PO TABS
ORAL_TABLET | ORAL | 3 refills | Status: DC
  Filled 2021-10-25 – 2021-12-11 (×2): qty 60, 30d supply, fill #0
  Filled 2022-01-23: qty 60, 30d supply, fill #1
  Filled 2022-03-16: qty 60, 30d supply, fill #2

## 2021-10-25 MED ORDER — CARVEDILOL 3.125 MG PO TABS
3.1250 mg | ORAL_TABLET | Freq: Two times a day (BID) | ORAL | 3 refills | Status: DC
  Filled 2021-10-25 – 2022-01-23 (×2): qty 60, 30d supply, fill #0
  Filled 2022-05-14: qty 60, 30d supply, fill #1
  Filled 2022-08-14: qty 60, 30d supply, fill #2

## 2021-10-25 NOTE — Patient Instructions (Signed)
Labs done today, your results will be available in MyChart, we will contact you for abnormal readings. ? ?Your physician recommends that you schedule a follow-up appointment in: 4 months ? ?If you have any questions or concerns before your next appointment please send us a message through mychart or call our office at [phone].   ? ?TO LEAVE A MESSAGE FOR THE NURSE SELECT OPTION 2, PLEASE LEAVE A MESSAGE INCLUDING: ?YOUR NAME ?DATE OF BIRTH ?CALL BACK NUMBER ?REASON FOR CALL**this is important as we prioritize the call backs ? ?YOU WILL RECEIVE A CALL BACK THE SAME DAY AS LONG AS YOU CALL BEFORE 4:00 PM ? ?At the [facility], you and your health needs are our priority. As part of our continuing mission to provide you with exceptional heart care, we have created designated Provider Care Teams. These Care Teams include your primary Cardiologist (physician) and Advanced Practice Providers (APPs- Physician Assistants and Nurse Practitioners) who all work together to provide you with the care you need, when you need it.  ? ?You may see any of the following providers on your designated Care Team at your next follow up: ?Dr Daniel Bensimhon ?Dr Dalton McLean ?Amy Clegg, NP ?Brittainy Simmons, PA ?Jessica Milford,NP ?Lindsay Finch, PA ?Lauren Kemp, PharmD ? ? ?Please be sure to bring in all your medications bottles to every appointment.  ? ? ?

## 2021-10-25 NOTE — Addendum Note (Signed)
Encounter addended by: Jerl Mina, RN on: 10/25/2021 1:03 PM  Actions taken: Diagnosis association updated, Order list changed, Clinical Note Signed

## 2021-10-25 NOTE — Addendum Note (Signed)
Encounter addended by: Jerl Mina, RN on: 10/25/2021 1:04 PM  Actions taken: Charge Capture section accepted

## 2021-10-25 NOTE — Progress Notes (Signed)
[facility] Note    Date:  10/25/2021   ID:  Joel Beck, DOB 01/31/1956, MRN [id number]  PCP:  Arma Heading, MD  Cardiologist:   Dr. Johnsie Cancel HF Cardiologist: Dr. [name]   History of Present Illness: Joel Beck is a 64 y.o. male with NICM, HTN,  and chronic systolic heart failure diagnosed in 2015.    He returned for HF follow up 4/21. He did not bring his medications as requested at last visit but says he is taking all meds regularly. Says he feels great. Says he can walk 1 hour without problem. NYHA I symptoms.  Admitted 5/22 for A/CHF. Diuresed with IV lasix. Repeat echo EF 20-25%. Discharged on po lasix prn.   Saw Dr [name] 06/21/21 and was volume overloaded. He was switched from lasix to torsemide 40 daily and instructed to take metolazone 2.5 mg for 2 days.   Returned for HF follow up 8/22 and was taking lasix and torsemide daily and had stopped his spiro. He was referred to paramedicine to help with medications.  At last visit. Volume was up and given metolazone  Today he returns for HF follow up with [facility] [employer]. Says he is doing well. Able to do all activities without too much trouble. Gets SOB doing pushups. No edema, orthopnea or edema. Compliant with medicines.   Cardiac studies: - Echo (8/15): EF 25%  - Echo (7/17): EF 20-25%  - Echo (6/18): EF 20-25% Grade II DD - Echo (6/19): EF 20-25%, Grade I DD - Echo (5/22): EF 20-25%, RV ok. - Cath by Dr Martinique 2015 with no CAD  Unable to tolerate cMRI due to claustrophobia.   -CPX Test (7/18): Peak VO2: 20.6 (74% predicted peak VO2) VE/VCO2 slope:  28 OUES: 2.55 Peak RER: 0.96 Submax test. Mildly reduced functional capacity. Mild chrontropic incompetence in setting of submax effort. No obvious HF or pulmonary limitation noted.   Review of systems complete and found to be negative unless listed in HPI.   Past Medical History:  Diagnosis Date   CHF (congestive heart failure)  ([place]) 07/05/2014   Hypertension Dx 2015   Past Surgical History:  Procedure Laterality Date   LEFT HEART CATHETERIZATION WITH CORONARY ANGIOGRAM N/A 07/05/2014   Procedure: LEFT HEART CATHETERIZATION WITH CORONARY ANGIOGRAM;  Surgeon: Peter M Martinique, MD;  Location: [facility] LAB;  Service: Cardiovascular;  Laterality: N/A;   Current Outpatient Medications  Medication Sig Dispense Refill   aspirin EC 81 MG tablet Take 1 tablet (81 mg total) by mouth daily. 90 tablet 3   carvedilol (COREG) 3.125 MG tablet Take 1 tablet (3.125 mg total) by mouth 2 (two) times daily. 60 tablet 3   empagliflozin (JARDIANCE) 10 MG TABS tablet Take 1 tablet (10 mg total) by mouth daily before breakfast. 30 tablet 3   sacubitril-valsartan (ENTRESTO) 97-103 MG TAKE 1 TABLET BY MOUTH 2 (TWO) TIMES DAILY. MUST KEEP PENDING APPOINTMENT FOR FURTHER REFILLS 60 tablet 3   spironolactone (ALDACTONE) 25 MG tablet Take 0.5 tablets (12.5 mg total) by mouth daily. 45 tablet 3   torsemide (DEMADEX) 20 MG tablet Take 3 tablets (60 mg total) by mouth daily. 180 tablet 3   No current facility-administered medications for this encounter.   Allergies:   Ace inhibitors   Social History:  The patient  reports that he quit smoking about 44 years ago. His smoking use included cigarettes. He has never used smokeless tobacco. He reports current alcohol use of  about 1.0 standard drink per week. He reports that he does not use drugs.   Family History:  Father had high blood pressure and diabetes. Mother had no cardiac problems. No other pertinent family history.   Review of systems complete and found to be negative unless listed in HPI.    BP 115/75   Pulse 98   Wt 82.1 kg (181 lb)   SpO2 97%   BMI 29.21 kg/m   Wt Readings from Last 3 Encounters:  10/25/21 82.1 kg (181 lb)  08/22/21 80.9 kg (178 lb 6.4 oz)  07/25/21 80.6 kg (177 lb 12.8 oz)   Physical Exam General:  Well appearing. No resp difficulty HEENT: normal Neck: supple. no  JVD. Carotids 2+ bilat; no bruits. No lymphadenopathy or thryomegaly appreciated. Cor: PMI nondisplaced. Regular rate & rhythm. No rubs, gallops or murmurs. Lungs: clear Abdomen: soft, nontender, nondistended. No hepatosplenomegaly. No bruits or masses. Good bowel sounds. Extremities: no cyanosis, clubbing, rash, edema Neuro: alert & orientedx3, cranial nerves grossly intact. moves all 4 extremities w/o difficulty. Affect pleasant   ASSESSMENT AND PLAN:  1.  Chronic systolic HF:  - Due to NICM. Suspect HTN in nature. Cath without significant CAD 2015. EF has been down since 2015.  - LHC (2015) no CAD - Echo 05/2018 LVEF 20-25%, Grade 1 DD.  - Echo (5/22): EF 20-25%m RV ok - Stable NYHA II - Continue torsemide 60 mg daily.  - Continue spiro 12.5 mg daily. - Continue Jardiance 10 mg daily. - Continue Entresto 97-103 mg bid.  - Continue Coreg 3.125 mg bid. He has not tolerated higher doses. - Pt intolerant to cMRI with claustrophobia.  - CPX 06/2017 with suboptimal effort.  - Continues to refuse ICD. - He has had longstanding cardiomyopathy and I am worried he may be getting worse. We discussed repeating CPX testing at last visit.Will wait until he is better diuresed to schedule.  2. HTN  - Blood pressure well controlled. Continue current regimen.  3. Noncompliance - Difficult situation with medication compliance.  - He was followed by Paramedicine but he says he has too many appts to meet with them    Glori Bickers, MD  10/25/21 12:41 PM

## 2021-10-25 NOTE — Addendum Note (Signed)
Encounter addended by: Jerl Mina, RN on: 10/25/2021 12:59 PM  Actions taken: Order list changed

## 2021-10-31 ENCOUNTER — Other Ambulatory Visit: Payer: Self-pay

## 2021-11-09 ENCOUNTER — Emergency Department (HOSPITAL_COMMUNITY)
Admission: EM | Admit: 2021-11-09 | Discharge: 2021-11-09 | Disposition: A | Discharge status: Home / Self Care | Payer: Medicare (Managed Care) | Attending: Emergency Medicine | Admitting: Emergency Medicine

## 2021-11-09 ENCOUNTER — Other Ambulatory Visit: Payer: Self-pay

## 2021-11-09 ENCOUNTER — Emergency Department (HOSPITAL_COMMUNITY): Payer: Medicare (Managed Care)

## 2021-11-09 DIAGNOSIS — R0602 Shortness of breath: Secondary | ICD-10-CM | POA: Diagnosis present

## 2021-11-09 DIAGNOSIS — I110 Hypertensive heart disease with heart failure: Secondary | ICD-10-CM | POA: Insufficient documentation

## 2021-11-09 DIAGNOSIS — R2243 Localized swelling, mass and lump, lower limb, bilateral: Secondary | ICD-10-CM | POA: Diagnosis not present

## 2021-11-09 DIAGNOSIS — Z7982 Long term (current) use of aspirin: Secondary | ICD-10-CM | POA: Diagnosis not present

## 2021-11-09 DIAGNOSIS — Z87891 Personal history of nicotine dependence: Secondary | ICD-10-CM | POA: Diagnosis not present

## 2021-11-09 DIAGNOSIS — I5023 Acute on chronic systolic (congestive) heart failure: Secondary | ICD-10-CM | POA: Diagnosis not present

## 2021-11-09 LAB — COMPREHENSIVE METABOLIC PANEL
ALT: 41 U/L (ref 0–44)
AST: 29 U/L (ref 15–41)
Albumin: 3.4 g/dL — ABNORMAL LOW (ref 3.5–5.0)
Alkaline Phosphatase: 70 U/L (ref 38–126)
Anion gap: 9 (ref 5–15)
BUN: 28 mg/dL — ABNORMAL HIGH (ref 8–23)
CO2: 28 mmol/L (ref 22–32)
Calcium: 8.7 mg/dL — ABNORMAL LOW (ref 8.9–10.3)
Chloride: 103 mmol/L (ref 98–111)
Creatinine, Ser: 1.81 mg/dL — ABNORMAL HIGH (ref 0.61–1.24)
GFR, Estimated: 41 mL/min — ABNORMAL LOW (ref >60)
Glucose, Bld: 107 mg/dL — ABNORMAL HIGH (ref 70–99)
Potassium: 3.5 mmol/L (ref 3.5–5.1)
Sodium: 140 mmol/L (ref 135–145)
Total Bilirubin: 1 mg/dL (ref 0.3–1.2)
Total Protein: 5.8 g/dL — ABNORMAL LOW (ref 6.5–8.1)

## 2021-11-09 LAB — CBC WITH DIFFERENTIAL/PLATELET
Abs Immature Granulocytes: 0.04 10*3/uL (ref 0.00–0.07)
Basophils Absolute: 0 10*3/uL (ref 0.0–0.1)
Basophils Relative: 0 %
Eosinophils Absolute: 0.1 10*3/uL (ref 0.0–0.5)
Eosinophils Relative: 2 %
HCT: 42.9 % (ref 39.0–52.0)
Hemoglobin: 14.2 g/dL (ref 13.0–17.0)
Immature Granulocytes: 1 %
Lymphocytes Relative: 13 %
Lymphs Abs: 0.9 10*3/uL (ref 0.7–4.0)
MCH: 32.1 pg (ref 26.0–34.0)
MCHC: 33.1 g/dL (ref 30.0–36.0)
MCV: 97.1 fL (ref 80.0–100.0)
Monocytes Absolute: 0.6 10*3/uL (ref 0.1–1.0)
Monocytes Relative: 9 %
Neutro Abs: 5.2 10*3/uL (ref 1.7–7.7)
Neutrophils Relative %: 75 %
Platelets: 171 10*3/uL (ref 150–400)
RBC: 4.42 MIL/uL (ref 4.22–5.81)
RDW: 15.2 % (ref 11.5–15.5)
WBC: 7 10*3/uL (ref 4.0–10.5)
nRBC: 0 % (ref 0.0–0.2)

## 2021-11-09 LAB — BRAIN NATRIURETIC PEPTIDE: B Natriuretic Peptide: 2893.7 pg/mL — ABNORMAL HIGH (ref 0.0–100.0)

## 2021-11-09 MED ORDER — POTASSIUM CHLORIDE CRYS ER 20 MEQ PO TBCR
20.0000 meq | EXTENDED_RELEASE_TABLET | Freq: Every day | ORAL | 0 refills | Status: DC
  Filled 2021-11-09: qty 3, 3d supply, fill #0

## 2021-11-09 MED ORDER — FUROSEMIDE 10 MG/ML IJ SOLN
80.0000 mg | Freq: Once | INTRAMUSCULAR | Status: AC
  Administered 2021-11-09: 80 mg via INTRAVENOUS
  Filled 2021-11-09: qty 8

## 2021-11-09 NOTE — ED Provider Notes (Signed)
Emergency Medicine Provider Triage Evaluation Note  Joel Beck , a 65 y.o. male  was evaluated in triage.  Pt complains of shortness of breath and leg swelling.  He has a history of heart failure, ate a lot of salt recently.  The past 2 to 3 days, he has had shortness of breath when he lays flat and with exertion.  Also has bilateral leg swelling.  He is taking his fluid pill without improvement.  No cough or fevers  Review of Systems  Positive: Sob, leg swelling Negative: fever  Physical Exam  BP (!) 132/100 (BP Location: Left Arm)   Pulse 100   Temp (!) 97.5 F (36.4 C) (Oral)   Resp 20   SpO2 100%  Gen:   Awake, no distress   Resp:  Normal effort  MSK:   Moves extremities without difficulty.  Mild bilateral leg swelling.  Medical Decision Making  Medically screening exam initiated at 10:14 AM.  Appropriate orders placed.  Joel Beck was informed that the remainder of the evaluation will be completed by another provider, this initial triage assessment does not replace that evaluation, and the importance of remaining in the ED until their evaluation is complete.  Labs, cxr, ekg   Joel Heidelberg, PA-C 11/09/21 1015    Joel Starch, MD 11/09/21 1020

## 2021-11-09 NOTE — ED Provider Notes (Signed)
[facility] EMERGENCY DEPARTMENT Provider Note   CSN: [id number] Arrival date & time: 11/09/21  1000     History Chief Complaint  Patient presents with   Shortness of Breath   Leg Swelling    Joel Beck is a 65 y.o. male.  HPI 65 year old male with a history of systolic CHF presents with shortness of breath.  He states the shortness of breath has been prominent over the last 3 days.  It is hard for him to sleep due to it and it bothers him during exertion.  At rest he has no symptoms.  He denies chest pain, fever, cough.  He has been having bilateral lower extremity swelling.  He reports compliance with his meds that he states has been having increased salt recently.  He feels like his meds aren't working.  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   Onychomycosis 03/47/4259   Chronic systolic CHF (congestive heart failure) ([place]) 07/05/2014   CHF (congestive heart failure) ([place]) 07/05/2014   Accelerated hypertension 07/04/2014   GERD (gastroesophageal reflux disease) 07/04/2014    Past Surgical History:  Procedure Laterality Date   LEFT HEART CATHETERIZATION WITH CORONARY ANGIOGRAM N/A 07/05/2014   Procedure: LEFT HEART CATHETERIZATION WITH CORONARY ANGIOGRAM;  Surgeon: Peter M Martinique, MD;  Location: [facility] CATH LAB;  Service: Cardiovascular;  Laterality: N/A;       Family History  Problem Relation Age of Onset   Cancer Neg Hx    Heart disease Neg Hx     Social History   Tobacco Use   Smoking  status: Former    Types: Cigarettes    Quit date: 12/03/1976    Years since quitting: 44.9   Smokeless tobacco: Never  Substance Use Topics   Alcohol use: Yes    Alcohol/week: 1.0 standard drink    Types: 1 Cans of beer per week    Comment: occ   Drug use: No    Home Medications Prior to Admission medications   Medication Sig Start Date End Date Taking? Authorizing Provider  potassium chloride SA (KLOR-CON M) 20 MEQ tablet Take 1 tablet (20 mEq total) by mouth daily. 11/09/21  Yes Sherwood Gambler, MD  aspirin EC 81 MG tablet Take 1 tablet (81 mg total) by mouth daily. 03/15/16   Funches, Adriana Mccallum, MD  carvedilol (COREG) 3.125 MG tablet Take 1 tablet (3.125 mg total) by mouth 2 (two) times daily. 10/25/21   Bensimhon, Shaune Pascal, MD  empagliflozin (JARDIANCE) 10 MG TABS tablet Take 1 tablet (10 mg total) by mouth daily before breakfast. 08/10/21   Milford, Maricela Bo, FNP  sacubitril-valsartan (ENTRESTO) 97-103 MG TAKE 1 TABLET BY MOUTH 2 (TWO) TIMES DAILY. MUST KEEP PENDING APPOINTMENT FOR FURTHER REFILLS 10/25/21   Bensimhon, Shaune Pascal, MD  spironolactone (ALDACTONE) 25 MG tablet Take 0.5 tablets (12.5 mg total) by mouth daily. 07/25/21 11/15/21  Rafael Bihari, FNP  torsemide (DEMADEX) 20 MG tablet Take 3 tablets (60 mg total) by mouth daily. 06/29/21   Conrad Stephens, NP  Allergies    Ace inhibitors  Review of Systems   Review of Systems  Constitutional:  Negative for fever.  Respiratory:  Positive for shortness of breath. Negative for cough.   Cardiovascular:  Positive for leg swelling. Negative for chest pain.  All other systems reviewed and are negative.  Physical Exam Updated Vital Signs BP (!) 126/96 (BP Location: Left Arm)   Pulse 80   Temp (!) 97.5 F (36.4 C) (Oral)   Resp 20   Ht 5\' 6"  (1.676 m)   Wt 86.2 kg   SpO2 100%   BMI 30.67 kg/m   Physical Exam Vitals and nursing note reviewed.  Constitutional:      General: He is not in acute distress.    Appearance: He  is well-developed. He is not ill-appearing or diaphoretic.  HENT:     Head: Normocephalic and atraumatic.     Right Ear: External ear normal.     Left Ear: External ear normal.     Nose: Nose normal.  Eyes:     General:        Right eye: No discharge.        Left eye: No discharge.  Neck:     Vascular: JVD present.  Cardiovascular:     Rate and Rhythm: Normal rate and regular rhythm.     Heart sounds: Normal heart sounds.  Pulmonary:     Effort: Pulmonary effort is normal. No tachypnea or accessory muscle usage.     Breath sounds: Normal breath sounds. No wheezing or rales.  Abdominal:     Palpations: Abdomen is soft.     Tenderness: There is no abdominal tenderness.  Musculoskeletal:     Cervical back: Neck supple.     Right lower leg: Edema present.     Left lower leg: Edema present.     Comments: Mild pitting edema, most prominent in the ankles and feet but a little bit in the lower legs  Skin:    General: Skin is warm and dry.  Neurological:     Mental Status: He is alert.  Psychiatric:        Mood and Affect: Mood is not anxious.    ED Results / Procedures / Treatments   Labs (all labs ordered are listed, but only abnormal results are displayed) Labs Reviewed  COMPREHENSIVE METABOLIC PANEL - Abnormal; Notable for the following components:      Result Value   Glucose, Bld 107 (*)    BUN 28 (*)    Creatinine, Ser 1.81 (*)    Calcium 8.7 (*)    Total Protein 5.8 (*)    Albumin 3.4 (*)    GFR, Estimated 41 (*)    All other components within normal limits  BRAIN NATRIURETIC PEPTIDE - Abnormal; Notable for the following components:   B Natriuretic Peptide 2,893.7 (*)    All other components within normal limits  CBC WITH DIFFERENTIAL/PLATELET    EKG EKG Interpretation  Date/Time:  Thursday November 09 2021 10:07:21 EST Ventricular Rate:  96 PR Interval:  196 QRS Duration: 114 QT Interval:  394 QTC Calculation: 497 R Axis:   30 Text Interpretation: Normal  sinus rhythm Possible Left atrial enlargement Left ventricular hypertrophy with repolarization abnormality ( Cornell product ) Prolonged QT overall similar to Sept 2022 Confirmed by Sherwood Gambler [phone]) on 11/09/2021 11:36:10 AM  Radiology DG Chest 2 View  Result Date: 11/09/2021 CLINICAL DATA:  Short of breath, bilateral leg swelling EXAM: CHEST - 2  VIEW COMPARISON:  None. FINDINGS: Stable cardiomegaly. No effusion, infiltrate or pneumothorax. No acute osseous abnormality. IMPRESSION: Stable cardiomegaly. Electronically Signed   By: Suzy Bouchard M.D.   On: 11/09/2021 10:35    Procedures Procedures   Medications Ordered in ED Medications  furosemide (LASIX) injection 80 mg (80 mg Intravenous Given 11/09/21 1217)    ED Course  I have reviewed the triage vital signs and the nursing notes.  Pertinent labs & imaging results that were available during my care of the patient were reviewed by me and considered in my medical decision making (see chart for details).    MDM Rules/Calculators/A&P                           Patient appears to have a recurrent mild CHF exacerbation.  Based on chart review it seems like he has a history of some compliance issues and he states he has been having increased salt recently.  He otherwise is well-appearing and was able to ambulate without hypoxia or significant dyspnea.  He is feeling better after IV Lasix and has diuresed here.  I will increase his torsemide for the next 3 days and give some potassium as well given low normal potassium and expected drop with increased diuretic.  He otherwise has been instructed to follow-up with his cardiology specialist and return if symptoms worsen. Final Clinical Impression(s) / ED Diagnoses Final diagnoses:  Acute on chronic systolic congestive heart failure ([place])    Rx / DC Orders ED Discharge Orders          Ordered    potassium chloride SA (KLOR-CON M) 20 MEQ tablet  Daily        11/09/21 1332              Sherwood Gambler, MD 11/09/21 1336

## 2021-11-09 NOTE — ED Triage Notes (Signed)
Pt. Stated, My feet and ankle , lower legs swelling

## 2021-11-09 NOTE — ED Triage Notes (Signed)
Pt. Stated, I started having SOB 2 days ago

## 2021-11-09 NOTE — Discharge Instructions (Signed)
Increase your torsemide from 60 mg once per day to 60 mg twice per day for the next 3 days. Take the potassium prescribed during this time.   If you develop new or worsening shortness of breath, chest pain, or any other new/concerning symptoms then return to the ER for evaluation.  Aumente su torsemida de 60 mg una vez al da a 60 mg Rupert prximos 3 [place]. [employer] potasio prescrito durante [place].  Si presenta dificultad para respirar nueva o que empeora, dolor en el pecho o cualquier otro sntoma nuevo o preocupante, regrese a la sala de emergencias para una evaluacin.

## 2021-11-16 ENCOUNTER — Other Ambulatory Visit: Payer: Self-pay

## 2021-11-21 ENCOUNTER — Other Ambulatory Visit: Payer: Self-pay

## 2021-12-11 ENCOUNTER — Other Ambulatory Visit: Payer: Self-pay

## 2021-12-12 ENCOUNTER — Other Ambulatory Visit: Payer: Self-pay

## 2021-12-14 ENCOUNTER — Emergency Department (HOSPITAL_COMMUNITY)
Admission: EM | Admit: 2021-12-14 | Discharge: 2021-12-15 | Disposition: A | Discharge status: Home / Self Care | Payer: Medicare Other | Attending: Emergency Medicine | Admitting: Emergency Medicine

## 2021-12-14 ENCOUNTER — Emergency Department (HOSPITAL_COMMUNITY): Payer: Medicare Other

## 2021-12-14 DIAGNOSIS — I5021 Acute systolic (congestive) heart failure: Secondary | ICD-10-CM | POA: Insufficient documentation

## 2021-12-14 DIAGNOSIS — Z20822 Contact with and (suspected) exposure to covid-19: Secondary | ICD-10-CM | POA: Insufficient documentation

## 2021-12-14 DIAGNOSIS — R7309 Other abnormal glucose: Secondary | ICD-10-CM | POA: Insufficient documentation

## 2021-12-14 DIAGNOSIS — Z7982 Long term (current) use of aspirin: Secondary | ICD-10-CM | POA: Diagnosis not present

## 2021-12-14 DIAGNOSIS — R0602 Shortness of breath: Secondary | ICD-10-CM | POA: Diagnosis present

## 2021-12-14 DIAGNOSIS — R600 Localized edema: Secondary | ICD-10-CM | POA: Insufficient documentation

## 2021-12-14 LAB — BASIC METABOLIC PANEL
Anion gap: 8 (ref 5–15)
BUN: 26 mg/dL — ABNORMAL HIGH (ref 8–23)
CO2: 25 mmol/L (ref 22–32)
Calcium: 8.5 mg/dL — ABNORMAL LOW (ref 8.9–10.3)
Chloride: 105 mmol/L (ref 98–111)
Creatinine, Ser: 1.75 mg/dL — ABNORMAL HIGH (ref 0.61–1.24)
GFR, Estimated: 43 mL/min — ABNORMAL LOW (ref >60)
Glucose, Bld: 127 mg/dL — ABNORMAL HIGH (ref 70–99)
Potassium: 3.9 mmol/L (ref 3.5–5.1)
Sodium: 138 mmol/L (ref 135–145)

## 2021-12-14 LAB — CBC WITH DIFFERENTIAL/PLATELET
Abs Immature Granulocytes: 0.01 10*3/uL (ref 0.00–0.07)
Basophils Absolute: 0 10*3/uL (ref 0.0–0.1)
Basophils Relative: 0 %
Eosinophils Absolute: 0 10*3/uL (ref 0.0–0.5)
Eosinophils Relative: 1 %
HCT: 44.5 % (ref 39.0–52.0)
Hemoglobin: 15 g/dL (ref 13.0–17.0)
Immature Granulocytes: 0 %
Lymphocytes Relative: 8 %
Lymphs Abs: 0.5 10*3/uL — ABNORMAL LOW (ref 0.7–4.0)
MCH: 32.3 pg (ref 26.0–34.0)
MCHC: 33.7 g/dL (ref 30.0–36.0)
MCV: 95.7 fL (ref 80.0–100.0)
Monocytes Absolute: 0.6 10*3/uL (ref 0.1–1.0)
Monocytes Relative: 10 %
Neutro Abs: 4.4 10*3/uL (ref 1.7–7.7)
Neutrophils Relative %: 81 %
Platelets: 127 10*3/uL — ABNORMAL LOW (ref 150–400)
RBC: 4.65 MIL/uL (ref 4.22–5.81)
RDW: 14.4 % (ref 11.5–15.5)
WBC: 5.5 10*3/uL (ref 4.0–10.5)
nRBC: 0 % (ref 0.0–0.2)

## 2021-12-14 LAB — CBG MONITORING, ED: Glucose-Capillary: 120 mg/dL — ABNORMAL HIGH (ref 70–99)

## 2021-12-14 LAB — BRAIN NATRIURETIC PEPTIDE: B Natriuretic Peptide: 4405.1 pg/mL — ABNORMAL HIGH (ref 0.0–100.0)

## 2021-12-14 NOTE — ED Provider Triage Note (Signed)
Emergency Medicine Provider Triage Evaluation Note  Joel Beck , a 66 y.o. male  was evaluated in triage.  Patient states he has had worsening fatigue over the past couple of days.  He developed a cough today with a sore throat.  He feels like he is feeling feverish.  He also complains of worsening shortness of breath over the past couple of days.  Review of Systems  Positive: Fatigue, cough, sore throat, fever, shortness of breath Negative:   Physical Exam  BP (!) 151/117 (BP Location: Left Arm)    Pulse (!) 114    Temp 98.5 F (36.9 C) (Oral)    Resp 16    SpO2 96%  Gen:   Awake, no distress   Resp:  Normal effort  MSK:   Moves extremities without difficulty  Other:    Medical Decision Making  Medically screening exam initiated at 6:05 PM.  Appropriate orders placed.  Joel Beck was informed that the remainder of the evaluation will be completed by another provider, this initial triage assessment does not replace that evaluation, and the importance of remaining in the ED until their evaluation is complete.     Joel Beck, [place] 12/14/21 1805

## 2021-12-14 NOTE — ED Triage Notes (Signed)
Pt states worsening cough and SHOB for over a week. Pt states he has been fatigued.

## 2021-12-14 NOTE — ED Notes (Signed)
Pt states he haas a headache, sore throat, and a cough. Pt asked how much longer the wait.

## 2021-12-15 ENCOUNTER — Other Ambulatory Visit: Payer: Self-pay

## 2021-12-15 LAB — RESP PANEL BY RT-PCR (FLU A&B, COVID) ARPGX2
Influenza A by PCR: NEGATIVE
Influenza B by PCR: NEGATIVE
SARS Coronavirus 2 by RT PCR: NEGATIVE

## 2021-12-15 MED ORDER — FUROSEMIDE 10 MG/ML IJ SOLN
80.0000 mg | Freq: Once | INTRAMUSCULAR | Status: AC
  Administered 2021-12-15: 80 mg via INTRAVENOUS
  Filled 2021-12-15: qty 8

## 2021-12-15 NOTE — Discharge Instructions (Signed)
Please double your torsemide for the next couple days.  Call your cardiologist in the morning.

## 2021-12-15 NOTE — ED Provider Notes (Signed)
[facility] EMERGENCY DEPARTMENT Provider Note   CSN: [id number] Arrival date & time: 12/14/21  1733     History  Chief Complaint  Patient presents with   Shortness of Breath    Joel Beck is a 66 y.o. male.  66 yo M with a chief complaints of shortness of breath.  Going on for a few days now.  Feels like he has been coughing quite a bit.  Feels like it is too much fluid.  No fevers or chills.  The history is provided by the patient.  Shortness of Breath Severity:  Moderate Onset quality:  Gradual Duration:  2 days Timing:  Constant Progression:  Worsening Chronicity:  Recurrent Relieved by:  Nothing Worsened by:  Nothing Ineffective treatments:  None tried Associated symptoms: cough   Associated symptoms: no abdominal pain, no chest pain, no fever, no headaches, no rash and no vomiting       Home Medications Prior to Admission medications   Medication Sig Start Date End Date Taking? Authorizing Provider  aspirin EC 81 MG tablet Take 1 tablet (81 mg total) by mouth daily. 03/15/16   Funches, Adriana Mccallum, MD  carvedilol (COREG) 3.125 MG tablet Take 1 tablet (3.125 mg total) by mouth 2 (two) times daily. 10/25/21   Bensimhon, Shaune Pascal, MD  empagliflozin (JARDIANCE) 10 MG TABS tablet Take 1 tablet (10 mg total) by mouth daily before breakfast. 08/10/21   Milford, Maricela Bo, FNP  potassium chloride SA (KLOR-CON M) 20 MEQ tablet Take 1 tablet (20 mEq total) by mouth daily. 11/09/21   Sherwood Gambler, MD  sacubitril-valsartan (ENTRESTO) 97-103 MG TAKE 1 TABLET BY MOUTH 2 (TWO) TIMES DAILY. MUST KEEP PENDING APPOINTMENT FOR FURTHER REFILLS 10/25/21   Bensimhon, Shaune Pascal, MD  spironolactone (ALDACTONE) 25 MG tablet Take 0.5 tablets (12.5 mg total) by mouth daily. 07/25/21 11/15/21  Rafael Bihari, FNP  torsemide (DEMADEX) 20 MG tablet Take 3 tablets (60 mg total) by mouth daily. 06/29/21   Clegg, Amy D, NP      Allergies    Ace inhibitors    Review of Systems    Review of Systems  Constitutional:  Negative for chills and fever.  HENT:  Negative for congestion and facial swelling.   Eyes:  Negative for discharge and visual disturbance.  Respiratory:  Positive for cough and shortness of breath.   Cardiovascular:  Negative for chest pain and palpitations.  Gastrointestinal:  Negative for abdominal pain, diarrhea and vomiting.  Musculoskeletal:  Negative for arthralgias and myalgias.  Skin:  Negative for color change and rash.  Neurological:  Negative for tremors, syncope and headaches.  Psychiatric/Behavioral:  Negative for confusion and dysphoric mood.    Physical Exam Updated Vital Signs BP (!) 131/95    Pulse 88    Temp 99.8 F (37.7 C)    Resp (!) 26    Ht 5\' 6"  (1.676 m)    Wt 87.1 kg    SpO2 97%    BMI 30.99 kg/m  Physical Exam Vitals and nursing note reviewed.  Constitutional:      Appearance: He is well-developed.  HENT:     Head: Normocephalic and atraumatic.  Eyes:     Pupils: Pupils are equal, round, and reactive to light.  Neck:     Vascular: JVD present.  Cardiovascular:     Rate and Rhythm: Normal rate and regular rhythm.     Heart sounds: No murmur heard.   No friction rub. No gallop.  Pulmonary:     Effort: No respiratory distress.     Breath sounds: No wheezing, rhonchi or rales.  Abdominal:     General: There is no distension.     Tenderness: There is no abdominal tenderness. There is no guarding or rebound.  Musculoskeletal:        General: Normal range of motion.     Cervical back: Normal range of motion and neck supple.     Right lower leg: Edema present.     Left lower leg: Edema present.  Skin:    Coloration: Skin is not pale.     Findings: No rash.  Neurological:     Mental Status: He is alert and oriented to person, place, and time.  Psychiatric:        Behavior: Behavior normal.    ED Results / Procedures / Treatments   Labs (all labs ordered are listed, but only abnormal results are  displayed) Labs Reviewed  BASIC METABOLIC PANEL - Abnormal; Notable for the following components:      Result Value   Glucose, Bld 127 (*)    BUN 26 (*)    Creatinine, Ser 1.75 (*)    Calcium 8.5 (*)    GFR, Estimated 43 (*)    All other components within normal limits  CBC WITH DIFFERENTIAL/PLATELET - Abnormal; Notable for the following components:   Platelets 127 (*)    Lymphs Abs 0.5 (*)    All other components within normal limits  BRAIN NATRIURETIC PEPTIDE - Abnormal; Notable for the following components:   B Natriuretic Peptide 4,405.1 (*)    All other components within normal limits  CBG MONITORING, ED - Abnormal; Notable for the following components:   Glucose-Capillary 120 (*)    All other components within normal limits  RESP PANEL BY RT-PCR (FLU A&B, COVID) ARPGX2    EKG EKG Interpretation  Date/Time:  Friday December 15 2021 04:53:22 EST Ventricular Rate:  94 PR Interval:  177 QRS Duration: 119 QT Interval:  361 QTC Calculation: 452 R Axis:   109 Text Interpretation: Sinus rhythm LAE, consider biatrial enlargement Consider left ventricular hypertrophy Nonspecific T abnormalities, inferior leads ST elevation, consider anterior injury No significant change since last tracing Confirmed by Deno Etienne [phone]) on 12/15/2021 5:36:06 AM  Radiology DG Chest 2 View  Result Date: 12/14/2021 CLINICAL DATA:  Pneumonia. Shortness of breath, cough, and wheezing. EXAM: CHEST - 2 VIEW COMPARISON:  11/09/2021 FINDINGS: Cardiac silhouette is again moderately enlarged. Mediastinal contours are within normal limits with mild calcification overlying the aortic arch. Mildly increased perihilar interstitial thickening. No pleural effusion or pneumothorax. No acute skeletal abnormality. IMPRESSION: Mild perihilar edema, likely mild interstitial pulmonary edema. Stable moderately enlarged cardiac silhouette. Electronically Signed   By: Yvonne Kendall   On: 12/14/2021 18:45     Procedures Procedures    Medications Ordered in ED Medications  furosemide (LASIX) injection 80 mg (80 mg Intravenous Given 12/15/21 0602)    ED Course/ Medical Decision Making/ A&P                           Medical Decision Making  66 yo M with a significant past medical history of congestive heart failure on torsemide comes in with a chief complaints of shortness of breath.  Has been to the emergency room multiple times for this in the past.  His BNP is elevated chest x-ray shows increased edema.  We will give a  dose of Lasix.  He does have a component to his symptoms that could be more consistent with viral ideology.  His COVID and flu tests are negative.  Not hypoxic here.  Was documented as tachypneic on most recent vital signs but on my exam was not tachypneic.  Patient feeling a little bit better on reassessment.  Will discharge home.  Have him double his diuretic at home.  Cards follow-up.  6:58 AM:  I have discussed the diagnosis/risks/treatment options with the patient and family and believe the pt to be eligible for discharge home to follow-up with Cards. We also discussed returning to the ED immediately if new or worsening sx occur. We discussed the sx which are most concerning (e.g., sudden worsening pain, fever, inability to tolerate by mouth) that necessitate immediate return. Medications administered to the patient during their visit and any new prescriptions provided to the patient are listed below.  Medications given during this visit Medications  furosemide (LASIX) injection 80 mg (80 mg Intravenous Given 12/15/21 0602)     The patient appears reasonably screen and/or stabilized for discharge and I doubt any other medical condition or other [facility] requiring further screening, evaluation, or treatment in the ED at this time prior to discharge.         Final Clinical Impression(s) / ED Diagnoses Final diagnoses:  Acute systolic congestive heart failure [facility])     Rx / DC Orders ED Discharge Orders     None         Deno Etienne, DO 12/15/21 [phone]

## 2022-01-22 ENCOUNTER — Emergency Department (HOSPITAL_COMMUNITY)
Admission: EM | Admit: 2022-01-22 | Discharge: 2022-01-23 | Disposition: A | Discharge status: Home / Self Care | Payer: Medicare Other | Attending: Emergency Medicine | Admitting: Emergency Medicine

## 2022-01-22 ENCOUNTER — Emergency Department (HOSPITAL_COMMUNITY): Payer: Medicare Other

## 2022-01-22 ENCOUNTER — Other Ambulatory Visit: Payer: Self-pay

## 2022-01-22 ENCOUNTER — Encounter (HOSPITAL_COMMUNITY): Payer: Self-pay

## 2022-01-22 DIAGNOSIS — U071 COVID-19: Secondary | ICD-10-CM | POA: Diagnosis not present

## 2022-01-22 DIAGNOSIS — R0602 Shortness of breath: Secondary | ICD-10-CM | POA: Diagnosis present

## 2022-01-22 LAB — CBC
HCT: 41.9 % (ref 39.0–52.0)
Hemoglobin: 14 g/dL (ref 13.0–17.0)
MCH: 31.9 pg (ref 26.0–34.0)
MCHC: 33.4 g/dL (ref 30.0–36.0)
MCV: 95.4 fL (ref 80.0–100.0)
Platelets: 101 10*3/uL — ABNORMAL LOW (ref 150–400)
RBC: 4.39 MIL/uL (ref 4.22–5.81)
RDW: 13.8 % (ref 11.5–15.5)
WBC: 5.4 10*3/uL (ref 4.0–10.5)
nRBC: 0 % (ref 0.0–0.2)

## 2022-01-22 LAB — COMPREHENSIVE METABOLIC PANEL
ALT: 46 U/L — ABNORMAL HIGH (ref 0–44)
AST: 59 U/L — ABNORMAL HIGH (ref 15–41)
Albumin: 3.4 g/dL — ABNORMAL LOW (ref 3.5–5.0)
Alkaline Phosphatase: 95 U/L (ref 38–126)
Anion gap: 13 (ref 5–15)
BUN: 22 mg/dL (ref 8–23)
CO2: 25 mmol/L (ref 22–32)
Calcium: 8.8 mg/dL — ABNORMAL LOW (ref 8.9–10.3)
Chloride: 103 mmol/L (ref 98–111)
Creatinine, Ser: 1.8 mg/dL — ABNORMAL HIGH (ref 0.61–1.24)
GFR, Estimated: 42 mL/min — ABNORMAL LOW (ref >60)
Glucose, Bld: 142 mg/dL — ABNORMAL HIGH (ref 70–99)
Potassium: 3.8 mmol/L (ref 3.5–5.1)
Sodium: 141 mmol/L (ref 135–145)
Total Bilirubin: 1.4 mg/dL — ABNORMAL HIGH (ref 0.3–1.2)
Total Protein: 6.2 g/dL — ABNORMAL LOW (ref 6.5–8.1)

## 2022-01-22 LAB — LIPASE, BLOOD: Lipase: 32 U/L (ref 11–51)

## 2022-01-22 NOTE — ED Provider Triage Note (Signed)
Emergency Medicine Provider Triage Evaluation Note  Joel Beck , a 66 y.o. male  was evaluated in triage.  Pt complains of shortness of breath and nonproductive cough x4 days.  He also states that last time he was here he had a "needle stuck in his abdomen", and has had abdominal pain since then.  Review of Systems  Positive: SOB, nonproductive cough, Abdominal pain Negative: Nausea, vomiting, fever  Physical Exam  BP (!) 122/96    Pulse (!) 105    Temp 98 F (36.7 C)    Resp 17    SpO2 99%  Gen:   Awake, no distress   Resp:  Normal effort  MSK:   Moves extremities without difficulty  Other:    Medical Decision Making  Medically screening exam initiated at 7:30 PM.  Appropriate orders placed.  JOSIA CUEVA was informed that the remainder of the evaluation will be completed by another provider, this initial triage assessment does not replace that evaluation, and the importance of remaining in the ED until their evaluation is complete.     Kateri Plummer, Hershal Coria 01/22/22 1930

## 2022-01-22 NOTE — ED Triage Notes (Signed)
Nonproductive cough and mild shob x 4 days.

## 2022-01-23 ENCOUNTER — Other Ambulatory Visit: Payer: Self-pay

## 2022-01-23 LAB — URINALYSIS, ROUTINE W REFLEX MICROSCOPIC
Bacteria, UA: NONE SEEN
Bilirubin Urine: NEGATIVE
Glucose, UA: NEGATIVE mg/dL
Hgb urine dipstick: NEGATIVE
Ketones, ur: NEGATIVE mg/dL
Leukocytes,Ua: NEGATIVE
Nitrite: NEGATIVE
Protein, ur: 30 mg/dL — AB
Specific Gravity, Urine: 1.013 (ref 1.005–1.030)
pH: 5 (ref 5.0–8.0)

## 2022-01-23 LAB — RESP PANEL BY RT-PCR (FLU A&B, COVID) ARPGX2
Influenza A by PCR: NEGATIVE
Influenza B by PCR: NEGATIVE
SARS Coronavirus 2 by RT PCR: POSITIVE — AB

## 2022-01-23 LAB — BRAIN NATRIURETIC PEPTIDE: B Natriuretic Peptide: >4500 pg/mL — ABNORMAL HIGH (ref 0.0–100.0)

## 2022-01-23 MED ORDER — FUROSEMIDE 10 MG/ML IJ SOLN
40.0000 mg | INTRAMUSCULAR | Status: AC
  Administered 2022-01-23: 40 mg via INTRAVENOUS
  Filled 2022-01-23: qty 4

## 2022-01-23 MED ORDER — IPRATROPIUM-ALBUTEROL 0.5-2.5 (3) MG/3ML IN SOLN
3.0000 mL | Freq: Once | RESPIRATORY_TRACT | Status: AC
  Administered 2022-01-23: 3 mL via RESPIRATORY_TRACT
  Filled 2022-01-23: qty 3

## 2022-01-23 MED ORDER — MOLNUPIRAVIR EUA 200MG CAPSULE
4.0000 | ORAL_CAPSULE | Freq: Two times a day (BID) | ORAL | 0 refills | Status: AC
Start: 2022-01-23 — End: 2022-01-28
  Filled 2022-01-23: qty 40, 5d supply, fill #0

## 2022-01-23 NOTE — ED Provider Notes (Addendum)
[facility] Emergency Department Provider Note MRN:  [id number]  Arrival date & time: 01/23/22     Chief Complaint   Shortness of Breath   History of Present Illness   Joel Beck is a 66 y.o. year-old male presents to the ED with chief complaint of SOB for the past several days.  He reports associated cough and headache.  He denies any fever.  He states that he feels like he has the flu.  He also states that it feels like he needs to be diuresed some.  History provided by patient.  Review of Systems  Pertinent review of systems noted in HPI.    Physical Exam   Vitals:   01/23/22 0230 01/23/22 0330  BP: (!) 132/105 (!) 123/97  Pulse: 96 91  Resp: (!) 25 (!) 25  Temp:    SpO2: 98% 95%    CONSTITUTIONAL:  well-appearing, NAD NEURO:  Alert and oriented x 3, CN 3-12 grossly intact EYES:  eyes equal and reactive ENT/NECK:  Supple, no stridor  CARDIO:  normal rate, regular rhythm, appears well-perfused  PULM:  No respiratory distress, mild crackles GI/GU:  non-distended,  MSK/SPINE:  No gross deformities, no edema, moves all extremities  SKIN:  no rash, atraumatic   *Additional and/or pertinent findings included in MDM below  Diagnostic and Interventional Summary    EKG Interpretation  Date/Time:  Monday January 22 2022 19:33:52 EST Ventricular Rate:  104 PR Interval:  184 QRS Duration: 110 QT Interval:  348 QTC Calculation: 457 R Axis:   116 Text Interpretation: Sinus tachycardia Right axis deviation Incomplete left bundle branch block ST elevation consider lateral injury or acute infarct Abnormal ECG When compared with ECG of 22-Jan-2022 19:21, PREVIOUS ECG IS PRESENT Confirmed by Gerlene Fee [phone]) on 01/23/2022 4:36:12 AM      EKG interpretation edited by me per Dr. Sedonia Small, asked to delete critical as the EKG is not a STEMI. Labs Reviewed  RESP PANEL BY RT-PCR (FLU A&B, COVID) ARPGX2 - Abnormal; Notable for the following components:       Result Value   SARS Coronavirus 2 by RT PCR POSITIVE (*)    All other components within normal limits  COMPREHENSIVE METABOLIC PANEL - Abnormal; Notable for the following components:   Glucose, Bld 142 (*)    Creatinine, Ser 1.80 (*)    Calcium 8.8 (*)    Total Protein 6.2 (*)    Albumin 3.4 (*)    AST 59 (*)    ALT 46 (*)    Total Bilirubin 1.4 (*)    GFR, Estimated 42 (*)    All other components within normal limits  CBC - Abnormal; Notable for the following components:   Platelets 101 (*)    All other components within normal limits  URINALYSIS, ROUTINE W REFLEX MICROSCOPIC - Abnormal; Notable for the following components:   APPearance HAZY (*)    Protein, ur 30 (*)    All other components within normal limits  BRAIN NATRIURETIC PEPTIDE - Abnormal; Notable for the following components:   B Natriuretic Peptide >4,500.0 (*)    All other components within normal limits  LIPASE, BLOOD    DG Chest 2 View  Final Result      Medications  ipratropium-albuterol (DUONEB) 0.5-2.5 (3) MG/3ML nebulizer solution 3 mL (3 mLs Nebulization Given 01/23/22 0300)  furosemide (LASIX) injection 40 mg (40 mg Intravenous Given 01/23/22 0300)     Procedures  /  Critical Care Procedures  ED Course and Medical Decision Making  I have reviewed the triage vital signs, the nursing notes, and pertinent available records from the EMR.  Complexity of Problems Addressed: High Complexity: Acute illness/injury posing a threat to life or bodily function, requiring emergent diagnostic workup, evaluation, and treatment as below.  ED Course:    After considering the following differential, covid, flu, pneumonia, CHF exacerbation, ACS, I ordered COVID/flu, lasix, and a neb.  I personally interpreted the labs which are notable for covid test positive, chronically elevated BNP I visualized the chest x-ray which is notable for cardiomegaly and agree with the radiologist interpretation. EKG I observed  the patient while on the cardiac monitor and noted no significant dysrhythmias..     Social Determinants Affecting Care:     Consultants: No consultations were needed in caring for this patient.   Treatment and Plan:  Patient here with shortness of breath, cough, and headache.  States he feels like he has flu.  His COVID test resulted as positive.  Given his CKD, will treat with molnupiravir.  Patient was given Lasix in the ED along with breathing treatment, and feels significantly improved.  He is requesting discharge.  I considered admission due to patient's initial presentation, but after considering the examination and diagnostic results, patient will not require admission and can be discharged with outpatient follow-up.  Patient seen by and discussed with attending physician, Dr. Sedonia Small, who agrees with plan for discharge.  Final Clinical Impressions(s) / ED Diagnoses     ICD-10-CM   1. COVID-19  U07.1       ED Discharge Orders          Ordered    molnupiravir EUA (LAGEVRIO) 200 mg CAPS capsule  2 times daily        01/23/22 0443             Discharge Instructions Discussed with and Provided to Patient:    Discharge Instructions      You have been diagnosed with COVID-19.  Please take medications as prescribed.  Take all of your regular home medications.  Return to the emergency department if your symptoms worsen.      Montine Circle, PA-C 01/23/22 0458    Montine Circle, PA-C 01/23/22 0459    Maudie Flakes, MD 01/23/22 [phone]

## 2022-01-23 NOTE — Discharge Instructions (Signed)
You have been diagnosed with COVID-19.  Please take medications as prescribed.  Take all of your regular home medications.  Return to the emergency department if your symptoms worsen.

## 2022-01-29 ENCOUNTER — Other Ambulatory Visit: Payer: Self-pay

## 2022-02-02 ENCOUNTER — Other Ambulatory Visit: Payer: Self-pay

## 2022-02-20 ENCOUNTER — Encounter (HOSPITAL_COMMUNITY): Payer: Self-pay

## 2022-02-20 ENCOUNTER — Other Ambulatory Visit: Payer: Self-pay

## 2022-02-20 ENCOUNTER — Emergency Department (HOSPITAL_COMMUNITY)
Admission: EM | Admit: 2022-02-20 | Discharge: 2022-02-20 | Disposition: A | Discharge status: Home / Self Care | Payer: Medicare Other | Attending: Emergency Medicine | Admitting: Emergency Medicine

## 2022-02-20 ENCOUNTER — Emergency Department (HOSPITAL_COMMUNITY): Payer: Medicare Other

## 2022-02-20 DIAGNOSIS — R531 Weakness: Secondary | ICD-10-CM | POA: Diagnosis not present

## 2022-02-20 DIAGNOSIS — R1013 Epigastric pain: Secondary | ICD-10-CM | POA: Diagnosis present

## 2022-02-20 DIAGNOSIS — K219 Gastro-esophageal reflux disease without esophagitis: Secondary | ICD-10-CM | POA: Insufficient documentation

## 2022-02-20 DIAGNOSIS — Z7982 Long term (current) use of aspirin: Secondary | ICD-10-CM | POA: Diagnosis not present

## 2022-02-20 LAB — CBC
HCT: 43.7 % (ref 39.0–52.0)
Hemoglobin: 14.4 g/dL (ref 13.0–17.0)
MCH: 31.6 pg (ref 26.0–34.0)
MCHC: 33 g/dL (ref 30.0–36.0)
MCV: 95.8 fL (ref 80.0–100.0)
Platelets: 122 10*3/uL — ABNORMAL LOW (ref 150–400)
RBC: 4.56 MIL/uL (ref 4.22–5.81)
RDW: 14.5 % (ref 11.5–15.5)
WBC: 6 10*3/uL (ref 4.0–10.5)
nRBC: 0 % (ref 0.0–0.2)

## 2022-02-20 LAB — COMPREHENSIVE METABOLIC PANEL
ALT: 24 U/L (ref 0–44)
AST: 25 U/L (ref 15–41)
Albumin: 3.8 g/dL (ref 3.5–5.0)
Alkaline Phosphatase: 86 U/L (ref 38–126)
Anion gap: 6 (ref 5–15)
BUN: 35 mg/dL — ABNORMAL HIGH (ref 8–23)
CO2: 30 mmol/L (ref 22–32)
Calcium: 8.9 mg/dL (ref 8.9–10.3)
Chloride: 102 mmol/L (ref 98–111)
Creatinine, Ser: 1.88 mg/dL — ABNORMAL HIGH (ref 0.61–1.24)
GFR, Estimated: 39 mL/min — ABNORMAL LOW (ref >60)
Glucose, Bld: 148 mg/dL — ABNORMAL HIGH (ref 70–99)
Potassium: 3.7 mmol/L (ref 3.5–5.1)
Sodium: 138 mmol/L (ref 135–145)
Total Bilirubin: 1.5 mg/dL — ABNORMAL HIGH (ref 0.3–1.2)
Total Protein: 6.7 g/dL (ref 6.5–8.1)

## 2022-02-20 LAB — LIPASE, BLOOD: Lipase: 38 U/L (ref 11–51)

## 2022-02-20 MED ORDER — LIDOCAINE VISCOUS HCL 2 % MT SOLN
15.0000 mL | Freq: Once | OROMUCOSAL | Status: AC
Start: 2022-02-20 — End: 2022-02-20
  Administered 2022-02-20: 15 mL via ORAL
  Filled 2022-02-20: qty 15

## 2022-02-20 MED ORDER — ALUM & MAG HYDROXIDE-SIMETH 200-200-20 MG/5ML PO SUSP
30.0000 mL | Freq: Once | ORAL | Status: AC
  Administered 2022-02-20: 30 mL via ORAL
  Filled 2022-02-20: qty 30

## 2022-02-20 MED ORDER — FAMOTIDINE 20 MG PO TABS
20.0000 mg | ORAL_TABLET | Freq: Two times a day (BID) | ORAL | 0 refills | Status: DC
  Filled 2022-02-20: qty 30, 15d supply, fill #0

## 2022-02-20 NOTE — Discharge Instructions (Signed)
Return for any problem.  ?

## 2022-02-20 NOTE — ED Provider Triage Note (Signed)
Emergency Medicine Provider Triage Evaluation Note ? ?Joel Beck , a 66 y.o. male  was evaluated in triage.  Pt complains of 3 months of abdominal pain described as bloating.  Patient also complains of intermittent shortness of breath with cough and white/green sputum production.  Patient denies vomiting.  Patient denies diarrhea.  Patient denies chest pain ? ?Review of Systems  ?Positive: Shortness of breath, abdominal pain ?Negative: Chest pain, diarrhea ? ?Physical Exam  ?BP (!) 135/104 (BP Location: Right Arm)   Pulse 99   Temp 98.2 ?F (36.8 ?C) (Oral)   Resp 16   Ht 5\' 11"  (1.803 m)   Wt 74.8 kg   SpO2 98%   BMI 23.01 kg/m?  ?Gen:   Awake, no distress   ?Resp:  Normal effort  ?MSK:   Moves extremities without difficulty  ?Other:   ? ?Medical Decision Making  ?Medically screening exam initiated at 6:02 PM.  Appropriate orders placed.  DIMITRIY CARRERAS was informed that the remainder of the evaluation will be completed by another provider, this initial triage assessment does not replace that evaluation, and the importance of remaining in the ED until their evaluation is complete. ? ? ?  ?Dorothyann Peng, PA-C ?02/20/22 1804 ? ?

## 2022-02-20 NOTE — ED Triage Notes (Signed)
Patient c/o mid abdominal pain and feeling bloated x months. Patient also c/o a productive cough with brown sputum. ?Patient denies N/V. ? ? ?Patient asked if he needs an interpreter and stated "no." ?

## 2022-02-20 NOTE — ED Provider Notes (Signed)
?[facility] DEPT ?Provider Note ? ? ?CSN: [id number] ?Arrival date & time: 02/20/22  1637 ? ?  ? ?History ? ?Chief Complaint  ?Patient presents with  ? Abdominal Pain  ? Bloated  ? Weakness  ? ? ?Joel Beck is a 66 y.o. male. ? ?66 year old male with prior medical history as detailed below presents for evaluation.  Patient complains of approximately 3 months of intermittent bloating and discomfort.  He reports feeling bloating in his epigastrium.  This seems to be associated with meals.  He also reports intermittent sour taste in his mouth. ? ?Patient denies chest pain or shortness of breath.  He denies significant abdominal discomfort currently. ? ?He does request imaging of his abdomen to " make sure there is nothing wrong." ? ?Patient without prior history of reported GERD.  Patient does not currently take antiacid medications. ? ?The history is provided by the patient and medical records.  ?Abdominal Pain ?Pain location:  Epigastric ?Pain quality: aching   ?Pain radiates to:  Does not radiate ?Pain severity:  No pain ?Onset quality:  Gradual ?Duration:  12 weeks ?Timing:  Intermittent ?Progression:  Waxing and waning ?Chronicity:  Chronic ?Weakness ?Associated symptoms: abdominal pain   ? ?  ? ?Home Medications ?Prior to Admission medications   ?Medication Sig Start Date End Date Taking? Authorizing Provider  ?famotidine (PEPCID) 20 MG tablet Take 1 tablet (20 mg total) by mouth 2 (two) times daily. 02/20/22  Yes Valarie Merino, MD  ?aspirin EC 81 MG tablet Take 1 tablet (81 mg total) by mouth daily. 03/15/16   Funches, Adriana Mccallum, MD  ?carvedilol (COREG) 3.125 MG tablet Take 1 tablet (3.125 mg total) by mouth 2 (two) times daily. 10/25/21   Bensimhon, Shaune Pascal, MD  ?empagliflozin (JARDIANCE) 10 MG TABS tablet Take 1 tablet (10 mg total) by mouth daily before breakfast. 08/10/21   Milford, Maricela Bo, FNP  ?Multiple Vitamins-Minerals (CENTRUM MEN) TABS Take 1 tablet by mouth daily.     [provider]  ?potassium chloride SA (KLOR-CON M) 20 MEQ tablet Take 1 tablet (20 mEq total) by mouth daily. 11/09/21   Sherwood Gambler, MD  ?sacubitril-valsartan (ENTRESTO) 97-103 MG TAKE 1 TABLET BY MOUTH 2 (TWO) TIMES DAILY. MUST KEEP PENDING APPOINTMENT FOR FURTHER REFILLS ?Patient taking differently: Take 1 tablet by mouth 2 (two) times daily. 10/25/21   Bensimhon, Shaune Pascal, MD  ?spironolactone (ALDACTONE) 25 MG tablet Take 0.5 tablets (12.5 mg total) by mouth daily. 07/25/21 01/23/22  Rafael Bihari, FNP  ?torsemide (DEMADEX) 20 MG tablet Take 3 tablets (60 mg total) by mouth daily. 06/29/21   Darrick Grinder D, NP  ?   ? ?Allergies    ?Ace inhibitors   ? ?Review of Systems   ?Review of Systems  ?Gastrointestinal:  Positive for abdominal pain.  ?Neurological:  Positive for weakness.  ?All other systems reviewed and are negative. ? ?Physical Exam ?Updated Vital Signs ?BP (!) 138/96   Pulse 87   Temp 98.2 ?F (36.8 ?C) (Oral)   Resp 16   Ht 5\' 11"  (1.803 m)   Wt 74.8 kg   SpO2 100%   BMI 23.01 kg/m?  ?Physical Exam ?Vitals and nursing note reviewed.  ?Constitutional:   ?   General: He is not in acute distress. ?   Appearance: Normal appearance. He is well-developed.  ?HENT:  ?   Head: Normocephalic and atraumatic.  ?Eyes:  ?   Conjunctiva/sclera: Conjunctivae normal.  ?   Pupils: Pupils  are equal, round, and reactive to light.  ?Cardiovascular:  ?   Rate and Rhythm: Normal rate and regular rhythm.  ?   Heart sounds: Normal heart sounds.  ?Pulmonary:  ?   Effort: Pulmonary effort is normal. No respiratory distress.  ?   Breath sounds: Normal breath sounds.  ?Abdominal:  ?   General: There is no distension.  ?   Palpations: Abdomen is soft.  ?   Tenderness: There is no abdominal tenderness.  ?Musculoskeletal:     ?   General: No deformity. Normal range of motion.  ?   Cervical back: Normal range of motion and neck supple.  ?Skin: ?   General: Skin is warm and dry.  ?Neurological:  ?   General: No  focal deficit present.  ?   Mental Status: He is alert and oriented to person, place, and time.  ? ? ?ED Results / Procedures / Treatments   ?Labs ?(all labs ordered are listed, but only abnormal results are displayed) ?Labs Reviewed  ?COMPREHENSIVE METABOLIC PANEL - Abnormal; Notable for the following components:  ?    Result Value  ? Glucose, Bld 148 (*)   ? BUN 35 (*)   ? Creatinine, Ser 1.88 (*)   ? Total Bilirubin 1.5 (*)   ? GFR, Estimated 39 (*)   ? All other components within normal limits  ?CBC - Abnormal; Notable for the following components:  ? Platelets 122 (*)   ? All other components within normal limits  ?LIPASE, BLOOD  ?URINALYSIS, ROUTINE W REFLEX MICROSCOPIC  ? ? ?EKG ?None ? ?Radiology ?CT ABDOMEN PELVIS WO CONTRAST ? ?Result Date: 02/20/2022 ?CLINICAL DATA:  Abdominal pain, shortness of breath, cough EXAM: CT ABDOMEN AND PELVIS WITHOUT CONTRAST TECHNIQUE: Multidetector CT imaging of the abdomen and pelvis was performed following the standard protocol without IV contrast. RADIATION DOSE REDUCTION: This exam was performed according to the departmental dose-optimization program which includes automated exposure control, adjustment of the mA and/or kV according to patient size and/or use of iterative reconstruction technique. COMPARISON:  05/27/2021 FINDINGS: Lower chest: Lung bases are essentially clear. Hepatobiliary: 11 mm cyst in the right hepatic lobe (series 2/image 19), unchanged. Contracted gallbladder with mild gallbladder wall edema. No intrahepatic or extrahepatic ductal dilatation. Pancreas: Within normal limits. Spleen: Within normal limits. Adrenals/Urinary Tract: Adrenal glands are within normal limits. Kidneys are within normal limits.  No renal calculi hydronephrosis. Thick-walled bladder, although underdistended. Stomach/Bowel: Stomach is within normal limits. No evidence of bowel obstruction. Normal appendix (coronal image 62). No colonic wall thickening or inflammatory changes.  Vascular/Lymphatic: No evidence of abdominal aortic aneurysm. Atherosclerotic calcifications of the abdominal aorta and branch vessels. No suspicious abdominopelvic lymphadenopathy. Reproductive: Prostate is unremarkable. Other: Small volume abdominopelvic ascites. Tiny fat containing periumbilical hernia. Musculoskeletal: Mild degenerative changes of the lumbar spine. IMPRESSION: No evidence of bowel obstruction.  Normal appendix. Small volume abdominopelvic ascites. No CT findings to account for the patient's abdominal pain. Electronically Signed   By: Julian Hy M.D.   On: 02/20/2022 20:56  ? ?DG Chest 2 View ? ?Result Date: 02/20/2022 ?CLINICAL DATA:  Shortness of breath. Patient c/o mid abdominal pain and feeling bloated x months. Patient also c/o a productive cough with brown sputum. Patient denies N/V. EXAM: CHEST - 2 VIEW COMPARISON:  Chest x-ray 222 FINDINGS: Stable cardiomegaly. The heart and mediastinal contours are unchanged. Aortic calcification. No focal consolidation. No pulmonary edema. No pleural effusion. No pneumothorax. No acute osseous abnormality. IMPRESSION: 1. Stable cardiomegaly  with no active cardiopulmonary disease. 2.  Aortic Atherosclerosis (ICD10-I70.0). Electronically Signed   By: Iven Finn M.D.   On: 02/20/2022 18:42   ? ?Procedures ?Procedures  ? ? ?Medications Ordered in ED ?Medications  ?alum & mag hydroxide-simeth (MAALOX/MYLANTA) 200-200-20 MG/5ML suspension 30 mL (30 mLs Oral Given 02/20/22 2035)  ?  And  ?lidocaine (XYLOCAINE) 2 % viscous mouth solution 15 mL (15 mLs Oral Given 02/20/22 2035)  ? ? ?ED Course/ Medical Decision Making/ A&P ?  ?                        ?Medical Decision Making ?Amount and/or Complexity of Data Reviewed ?Labs: ordered. ?Radiology: ordered. ? ?Risk ?OTC drugs. ?Prescription drug management. ? ? ? ?Medical Screen Complete ? ?This patient presented to the ED with complaint of epigastric bloating. ? ?This complaint involves an extensive number  of treatment options. The initial differential diagnosis includes, but is not limited to, GERD, metabolic abnormality, intra-abdominal pathology, intrathoracic pathology, etc. ? ?This presentation is: Chronic, Self

## 2022-02-21 ENCOUNTER — Other Ambulatory Visit: Payer: Self-pay

## 2022-02-21 ENCOUNTER — Encounter (HOSPITAL_COMMUNITY): Payer: Medicare (Managed Care)

## 2022-02-22 ENCOUNTER — Other Ambulatory Visit: Payer: Self-pay

## 2022-02-22 ENCOUNTER — Ambulatory Visit (HOSPITAL_COMMUNITY)
Admission: RE | Admit: 2022-02-22 | Discharge: 2022-02-22 | Disposition: A | Discharge status: Home / Self Care | Payer: Medicare Other | Source: Ambulatory Visit | Attending: Adult Health | Admitting: Adult Health

## 2022-02-22 ENCOUNTER — Encounter (HOSPITAL_COMMUNITY): Payer: Medicare (Managed Care)

## 2022-02-22 ENCOUNTER — Encounter (HOSPITAL_COMMUNITY): Payer: Self-pay

## 2022-02-22 VITALS — BP 122/80 | HR 96 | Wt 185.4 lb

## 2022-02-22 DIAGNOSIS — I5020 Unspecified systolic (congestive) heart failure: Secondary | ICD-10-CM

## 2022-02-22 DIAGNOSIS — Z79899 Other long term (current) drug therapy: Secondary | ICD-10-CM | POA: Insufficient documentation

## 2022-02-22 DIAGNOSIS — I5023 Acute on chronic systolic (congestive) heart failure: Secondary | ICD-10-CM | POA: Diagnosis present

## 2022-02-22 DIAGNOSIS — Z87891 Personal history of nicotine dependence: Secondary | ICD-10-CM | POA: Insufficient documentation

## 2022-02-22 DIAGNOSIS — I10 Essential (primary) hypertension: Secondary | ICD-10-CM

## 2022-02-22 DIAGNOSIS — I110 Hypertensive heart disease with heart failure: Secondary | ICD-10-CM | POA: Diagnosis not present

## 2022-02-22 DIAGNOSIS — Z9114 Patient's other noncompliance with medication regimen: Secondary | ICD-10-CM | POA: Insufficient documentation

## 2022-02-22 DIAGNOSIS — Z8616 Personal history of COVID-19: Secondary | ICD-10-CM | POA: Diagnosis not present

## 2022-02-22 DIAGNOSIS — R0609 Other forms of dyspnea: Secondary | ICD-10-CM | POA: Diagnosis not present

## 2022-02-22 LAB — BASIC METABOLIC PANEL
Anion gap: 7 (ref 5–15)
BUN: 31 mg/dL — ABNORMAL HIGH (ref 8–23)
CO2: 28 mmol/L (ref 22–32)
Calcium: 9 mg/dL (ref 8.9–10.3)
Chloride: 108 mmol/L (ref 98–111)
Creatinine, Ser: 1.92 mg/dL — ABNORMAL HIGH (ref 0.61–1.24)
GFR, Estimated: 38 mL/min — ABNORMAL LOW (ref >60)
Glucose, Bld: 98 mg/dL (ref 70–99)
Potassium: 3.6 mmol/L (ref 3.5–5.1)
Sodium: 143 mmol/L (ref 135–145)

## 2022-02-22 LAB — BRAIN NATRIURETIC PEPTIDE: B Natriuretic Peptide: >4500 pg/mL — ABNORMAL HIGH (ref 0.0–100.0)

## 2022-02-22 MED ORDER — FUROSEMIDE 10 MG/ML IJ SOLN
80.0000 mg | Freq: Once | INTRAMUSCULAR | Status: AC
  Administered 2022-02-22: 80 mg via INTRAVENOUS

## 2022-02-22 MED ORDER — POTASSIUM CHLORIDE CRYS ER 20 MEQ PO TBCR
40.0000 meq | EXTENDED_RELEASE_TABLET | Freq: Once | ORAL | Status: AC
  Administered 2022-02-22: 40 meq via ORAL

## 2022-02-22 NOTE — Progress Notes (Signed)
A.Clegg NP aware of patient void of 350 cc yellow urine after receiving IV lasix. ?

## 2022-02-22 NOTE — Progress Notes (Signed)
ReDS Vest / Clip - 02/22/22 1500   ? ?  ? ReDS Vest / Clip  ? Station Marker C   ? Ruler Value 28   ? ReDS Value Range High volume overload   ? ReDS Actual Value 53   ? ?  ?  ? ?  ? ? ?

## 2022-02-22 NOTE — Progress Notes (Signed)
?  ?Advanced Heart Failure Clinic Note  ? ? ?Date:  02/22/2022  ? ?ID:  Joel Beck, DOB 09-08-1956, MRN [id number] ? ?PCP:  Arma Heading, MD  ?Cardiologist:   Dr. Johnsie Cancel ?HF Cardiologist: Dr. [name] ?  ?History of Present Illness: ?Joel Beck is a 66 y.o. male with NICM, HTN,  and chronic systolic heart failure diagnosed in 2015.   ? ?Admitted 5/22 for A/CHF. Diuresed with IV lasix. Repeat echo EF 20-25%. Discharged on po lasix prn.  ? ?Saw Dr [name] 06/21/21 and was volume overloaded. He was switched from lasix to torsemide 40 daily and instructed to take metolazone 2.5 mg for 2 days.  ? ?Returned for HF follow up 8/22 and was taking lasix and torsemide daily and had stopped his spiro. He was referred to paramedicine to help with medication. Unable to start Paramedicine because he said he had to many appointments.  ? ?Saw Dr [name] 10/2021 and doing fine. NYHA II.  ? ?Evaluated in the ED 01/23/22 for increased dyspnea. Had [place] 01/23/22 and has not felt good since that time. Given IV lasix and started on Legevrio for COVID. Discharged to home.  ? ?Evaluated in the ED 02/20/22 for abdominal bloating. Started on GI cocktail. Discharged to home later that day.  ? ?Today he returns for HF follow up.Overall feeling fair. Complaining of SOB with exertion. + Orthopnea. + Abd bloating. Appetite ok. No fever or chills. Weight at home has been going up.  Taking all medications. ? ?Cardiac studies: ?- Echo (8/15): EF 25%  ?- Echo (7/17): EF 20-25%  ?- Echo (6/18): EF 20-25% Grade II DD ?- Echo (6/19): EF 20-25%, Grade I DD ?- Echo (5/22): EF 20-25%, RV ok. ?- Cath by Dr Martinique 2015 with no CAD ? ?Unable to tolerate cMRI due to claustrophobia.  ? ?-CPX Test (7/18): ?Peak VO2: 20.6 (74% predicted peak VO2) ?VE/VCO2 slope:  28 ?OUES: 2.55 ?Peak RER: 0.96 ?Submax test. Mildly reduced functional capacity. Mild chrontropic incompetence in setting of submax effort. No obvious HF or pulmonary limitation noted.   ? ?Review of systems complete and found to be negative unless listed in HPI.  ? ?Past Medical History:  ?Diagnosis Date  ? CHF (congestive heart failure) ([place]) 07/05/2014  ? Hypertension Dx 2015  ? ?Past Surgical History:  ?Procedure Laterality Date  ? LEFT HEART CATHETERIZATION WITH CORONARY ANGIOGRAM N/A 07/05/2014  ? Procedure: LEFT HEART CATHETERIZATION WITH CORONARY ANGIOGRAM;  Surgeon: Peter M Martinique, MD;  Location: [facility] CATH LAB;  Service: Cardiovascular;  Laterality: N/A;  ? ?Current Outpatient Medications  ?Medication Sig Dispense Refill  ? aspirin EC 81 MG tablet Take 1 tablet (81 mg total) by mouth daily. 90 tablet 3  ? carvedilol (COREG) 3.125 MG tablet Take 1 tablet (3.125 mg total) by mouth 2 (two) times daily. 60 tablet 3  ? empagliflozin (JARDIANCE) 10 MG TABS tablet Take 1 tablet (10 mg total) by mouth daily before breakfast. 30 tablet 3  ? famotidine (PEPCID) 20 MG tablet Take 1 tablet (20 mg total) by mouth 2 (two) times daily. 30 tablet 0  ? Multiple Vitamins-Minerals (CENTRUM MEN) TABS Take 1 tablet by mouth daily.    ? potassium chloride SA (KLOR-CON M) 20 MEQ tablet Take 1 tablet (20 mEq total) by mouth daily. 3 tablet 0  ? sacubitril-valsartan (ENTRESTO) 97-103 MG TAKE 1 TABLET BY MOUTH 2 (TWO) TIMES DAILY. MUST KEEP PENDING APPOINTMENT FOR FURTHER REFILLS 60 tablet 3  ? spironolactone (ALDACTONE) 25  MG tablet Take 0.5 tablets (12.5 mg total) by mouth daily. 45 tablet 3  ? torsemide (DEMADEX) 20 MG tablet Take 3 tablets (60 mg total) by mouth daily. 180 tablet 3  ? ?No current facility-administered medications for this encounter.  ? ?Allergies:   Ace inhibitors  ? ?Social History:  The patient  reports that he quit smoking about 45 years ago. His smoking use included cigarettes. He has never used smokeless tobacco. He reports current alcohol use of about 1.0 standard drink per week. He reports that he does not use drugs.  ? ?Family History:  Father had high blood pressure and diabetes. Mother had  no cardiac problems. No other pertinent family history.  ? ?Review of systems complete and found to be negative unless listed in HPI.   ? ?BP 122/80   Pulse 96   Wt 84.1 kg (185 lb 6.4 oz)   SpO2 98%   BMI 25.86 kg/m?  ? ?Wt Readings from Last 3 Encounters:  ?02/22/22 84.1 kg (185 lb 6.4 oz)  ?02/20/22 74.8 kg (165 lb)  ?12/15/21 87.1 kg (192 lb)  ? ? ReDS Vest / Clip - 02/22/22 1500   ? ?  ? ReDS Vest / Clip  ? Station Marker C   ? Ruler Value 28   ? ReDS Value Range High volume overload   ? ReDS Actual Value 53   ? ?  ?  ? ?  ? ?Spanish Interpreter present ?Physical Exam ?General: Walked slowly in the clinic. Marland Kitchen No resp difficulty ?HEENT: normal ?Neck: supple. JVP 11-12 . Carotids 2+ bilat; no bruits. No lymphadenopathy or thryomegaly appreciated. ?Cor: PMI nondisplaced. Regular rate & rhythm. No rubs, gallops or murmurs. ?Lungs: clear ?Abdomen: soft, nontender, distended. No hepatosplenomegaly. No bruits or masses. Good bowel sounds. ?Extremities: no cyanosis, clubbing, rash, edema ?Neuro: alert & orientedx3, cranial nerves grossly intact. moves all 4 extremities w/o difficulty. Affect pleasant ? ? ?ASSESSMENT AND PLAN: ? ?1.  Acute/Chronic systolic HF:  ?- Due to NICM. Suspect HTN in nature. Cath without significant CAD 2015. EF has been down since 2015.  ?- LHC (2015) no CAD ?- Echo 05/2018 LVEF 20-25%, Grade 1 DD.  ?- Echo (5/22): EF 20-25%m RV ok ?NYHA III. Wonder if recent COVID is also playing role Reds Clip 53%. Volume status elevated. Give 80 mg IV lasix + 40 meq K now.  Had 350 cc clear urine output. He wants to go home without assessing for additional urine output.  ?- Tomorrow will need to reassess volume status and will need to determine home diuretic regime. Consider torsemide 80 mg /40 mg .   ?- Continue spiro 12.5 mg daily. ?- Continue Jardiance 10 mg daily. ?- Continue Entresto 97-103 mg bid.  ?- Continue Coreg 3.125 mg bid. He has not tolerated higher doses. ?- Pt intolerant to cMRI with  claustrophobia.  ?- CPX 06/2017 with suboptimal effort.  ?- Continues to refuse ICD. ?- Check BMET and BNP  ?- Set up repeat ECHO ? ?2. HTN  ?- Stable ? ?3. Noncompliance ?Stress medication compliance.  ?-   ?4. H/O COVID ? 01/2022 .  ? ?Follow up tomorrow to reassess volume and determine home diuretic regimen.  ? ?Darrick Grinder, NP  ?02/22/22 ?2:29 PM ? ?

## 2022-02-22 NOTE — Patient Instructions (Addendum)
Thank you for coming in today  ?No medication changes were made ? ?Lasix 80 mg iv given in clinic along with  potassium 40 meq ? ?Your physician recommends that you schedule a follow-up appointment in: Friday March 24@ 10 Am  to reassess your fluid status ? ?If you have any questions or concerns before your next appointment please send [place] a message through [place] or call our office at [phone].   ? ?TO LEAVE A MESSAGE FOR THE NURSE SELECT OPTION 2, PLEASE LEAVE A MESSAGE INCLUDING: ?YOUR NAME ?DATE OF BIRTH ?CALL BACK NUMBER ?REASON FOR CALL**this is important as we prioritize the call backs ? ?YOU WILL RECEIVE A CALL BACK THE SAME DAY AS LONG AS YOU CALL BEFORE 4:00 PM ? ?At the [facility], you and your health needs are our priority. As part of our continuing mission to provide you with exceptional heart care, we have created designated Provider Care Teams. These Care Teams include your primary Cardiologist (physician) and Advanced Practice Providers (APPs- Physician Assistants and Nurse Practitioners) who all work together to provide you with the care you need, when you need it.  ? ?You may see any of the following providers on your designated Care Team at your next follow up: ?Dr Glori Bickers ?Dr Loralie Champagne ?Darrick Grinder, NP ?Lyda Jester, PA ?Jessica Milford,NP ?Marlyce Huge, PA ?Audry Riles, PharmD ? ? ?Please be sure to bring in all your medications bottles to every appointment.  ? ?Do the following things EVERYDAY: ?Weigh yourself in the morning before breakfast. Write it down and keep it in a log. ?Take your medicines as prescribed ?Eat low salt foods--Limit salt (sodium) to 2000 mg per day.  ?Stay as active as you can everyday ?Limit all fluids for the day to less than 2 liters ? ? ? ?

## 2022-02-23 ENCOUNTER — Encounter (HOSPITAL_COMMUNITY): Payer: Self-pay

## 2022-02-23 ENCOUNTER — Other Ambulatory Visit: Payer: Self-pay

## 2022-02-23 ENCOUNTER — Ambulatory Visit (HOSPITAL_COMMUNITY)
Admission: RE | Admit: 2022-02-23 | Discharge: 2022-02-23 | Disposition: A | Discharge status: Home / Self Care | Payer: Medicare Other | Source: Ambulatory Visit | Attending: Family Medicine | Admitting: Family Medicine

## 2022-02-23 VITALS — BP 120/94 | HR 101 | Ht 71.0 in | Wt 183.0 lb

## 2022-02-23 DIAGNOSIS — I428 Other cardiomyopathies: Secondary | ICD-10-CM | POA: Insufficient documentation

## 2022-02-23 DIAGNOSIS — I5022 Chronic systolic (congestive) heart failure: Secondary | ICD-10-CM | POA: Diagnosis not present

## 2022-02-23 DIAGNOSIS — Z9114 Patient's other noncompliance with medication regimen: Secondary | ICD-10-CM | POA: Insufficient documentation

## 2022-02-23 DIAGNOSIS — Z79899 Other long term (current) drug therapy: Secondary | ICD-10-CM | POA: Diagnosis not present

## 2022-02-23 DIAGNOSIS — Z7984 Long term (current) use of oral hypoglycemic drugs: Secondary | ICD-10-CM | POA: Diagnosis not present

## 2022-02-23 DIAGNOSIS — I5023 Acute on chronic systolic (congestive) heart failure: Secondary | ICD-10-CM | POA: Insufficient documentation

## 2022-02-23 DIAGNOSIS — I10 Essential (primary) hypertension: Secondary | ICD-10-CM | POA: Diagnosis not present

## 2022-02-23 DIAGNOSIS — Z8616 Personal history of COVID-19: Secondary | ICD-10-CM | POA: Diagnosis not present

## 2022-02-23 DIAGNOSIS — I110 Hypertensive heart disease with heart failure: Secondary | ICD-10-CM | POA: Diagnosis not present

## 2022-02-23 LAB — BASIC METABOLIC PANEL
Anion gap: 8 (ref 5–15)
BUN: 32 mg/dL — ABNORMAL HIGH (ref 8–23)
CO2: 26 mmol/L (ref 22–32)
Calcium: 8.9 mg/dL (ref 8.9–10.3)
Chloride: 108 mmol/L (ref 98–111)
Creatinine, Ser: 1.83 mg/dL — ABNORMAL HIGH (ref 0.61–1.24)
GFR, Estimated: 41 mL/min — ABNORMAL LOW (ref >60)
Glucose, Bld: 94 mg/dL (ref 70–99)
Potassium: 3.8 mmol/L (ref 3.5–5.1)
Sodium: 142 mmol/L (ref 135–145)

## 2022-02-23 LAB — BRAIN NATRIURETIC PEPTIDE: B Natriuretic Peptide: >4500 pg/mL — ABNORMAL HIGH (ref 0.0–100.0)

## 2022-02-23 MED ORDER — FUROSEMIDE 10 MG/ML IJ SOLN
80.0000 mg | Freq: Once | INTRAMUSCULAR | Status: AC
  Administered 2022-02-23: 80 mg via INTRAVENOUS

## 2022-02-23 MED ORDER — TORSEMIDE 20 MG PO TABS: 80.0000 mg | ORAL_TABLET | Freq: Two times a day (BID) | ORAL | 3 refills | Status: DC

## 2022-02-23 MED ORDER — POTASSIUM CHLORIDE CRYS ER 20 MEQ PO TBCR
40.0000 meq | EXTENDED_RELEASE_TABLET | Freq: Once | ORAL | Status: AC
  Administered 2022-02-23: 40 meq via ORAL

## 2022-02-23 MED ORDER — POTASSIUM CHLORIDE CRYS ER 20 MEQ PO TBCR
40.0000 meq | EXTENDED_RELEASE_TABLET | Freq: Two times a day (BID) | ORAL | 3 refills | Status: DC
  Filled 2022-02-23: qty 120, 30d supply, fill #0

## 2022-02-23 MED ORDER — POTASSIUM CHLORIDE 20 MEQ PO PACK: 40.0000 meq | PACK | Freq: Once | ORAL | Status: DC

## 2022-02-23 MED ORDER — METOLAZONE 2.5 MG PO TABS
2.5000 mg | ORAL_TABLET | Freq: Once | ORAL | Status: AC
  Administered 2022-02-23: 2.5 mg via ORAL

## 2022-02-23 NOTE — Addendum Note (Signed)
Encounter addended by: Scarlette Calico, RN on: 02/23/2022 12:59 PM ? Actions taken: Flowsheet accepted, Clinical Note Signed

## 2022-02-23 NOTE — Progress Notes (Signed)
?  ?Advanced Heart Failure Clinic Note  ? ? ?Date:  02/23/2022  ? ?ID:  Joel Beck, DOB [birth year], MRN [id number] ? ?PCP:  Arma Heading, MD  ?Cardiologist:   Dr. Johnsie Cancel ?HF Cardiologist: Dr. [name] ?  ?History of Present Illness: ?Joel Beck is a 66 y.o. male with NICM, HTN,  and chronic systolic heart failure diagnosed in 2015.   ? ?Admitted 5/22 for A/CHF. Diuresed with IV lasix. Repeat echo EF 20-25%. Discharged on po lasix prn.  ? ?Saw Dr [name] 06/21/21 and was volume overloaded. He was switched from lasix to torsemide 40 daily and instructed to take metolazone 2.5 mg for 2 days.  ? ?Returned for HF follow up 8/22 and was taking lasix and torsemide daily and had stopped his spiro. He was referred to paramedicine to help with medication. Unable to start Paramedicine because he said he had to many appointments.  ? ?Saw Dr [name] 10/2021 and doing fine. NYHA II.  ? ?Evaluated in the ED 01/23/22 for increased dyspnea. Had [place] 01/23/22 and has not felt good since that time. Given IV lasix and started on Legevrio for COVID. Discharged to home.  ? ?Evaluated in the ED 02/20/22 for abdominal bloating. Started on GI cocktail. Discharged to home later that day.  ? ?Follow up 02/22/22 volume overloaded, given 80 mg IV lasix + 40 KCL, ReDs 53%. ? ?Today he returns for HF follow up. Overall feeling fair, remains SOB with walking. + abdominal bloating, PND, and swelling. Appetite ok. No fever or chills. Weight at home has been going up.  Taking all medications. Says he did not urinate as aggressively at home after given IV lasix yesterday. ? ?Cardiac studies: ?- Echo (8/15): EF 25%  ?- Echo (7/17): EF 20-25%  ?- Echo (6/18): EF 20-25% Grade II DD ?- Echo (6/19): EF 20-25%, Grade I DD ?- Echo (5/22): EF 20-25%, RV ok. ?- Cath by Dr Martinique 2015 with no CAD ? ?Unable to tolerate cMRI due to claustrophobia.  ? ?- CPX Test (7/18): ?Peak VO2: 20.6 (74% predicted peak VO2) ?VE/VCO2 slope:  28 ?OUES: 2.55 ?Peak RER:  0.96 ?Submax test. Mildly reduced functional capacity. Mild chrontropic incompetence in setting of submax effort. No obvious HF or pulmonary limitation noted.  ? ?Review of systems complete and found to be negative unless listed in HPI.  ? ?Past Medical History:  ?Diagnosis Date  ? CHF (congestive heart failure) ([place]) 07/05/2014  ? Hypertension Dx 2015  ? ?Past Surgical History:  ?Procedure Laterality Date  ? LEFT HEART CATHETERIZATION WITH CORONARY ANGIOGRAM N/A 07/05/2014  ? Procedure: LEFT HEART CATHETERIZATION WITH CORONARY ANGIOGRAM;  Surgeon: Peter M Martinique, MD;  Location: [facility] CATH LAB;  Service: Cardiovascular;  Laterality: N/A;  ? ?Current Outpatient Medications  ?Medication Sig Dispense Refill  ? aspirin EC 81 MG tablet Take 1 tablet (81 mg total) by mouth daily. 90 tablet 3  ? carvedilol (COREG) 3.125 MG tablet Take 1 tablet (3.125 mg total) by mouth 2 (two) times daily. 60 tablet 3  ? empagliflozin (JARDIANCE) 10 MG TABS tablet Take 1 tablet (10 mg total) by mouth daily before breakfast. 30 tablet 3  ? famotidine (PEPCID) 20 MG tablet Take 1 tablet (20 mg total) by mouth 2 (two) times daily. 30 tablet 0  ? Multiple Vitamins-Minerals (CENTRUM MEN) TABS Take 1 tablet by mouth daily.    ? potassium chloride SA (KLOR-CON M) 20 MEQ tablet Take 1 tablet (20 mEq total) by mouth daily. 3  tablet 0  ? sacubitril-valsartan (ENTRESTO) 97-103 MG TAKE 1 TABLET BY MOUTH 2 (TWO) TIMES DAILY. MUST KEEP PENDING APPOINTMENT FOR FURTHER REFILLS 60 tablet 3  ? spironolactone (ALDACTONE) 25 MG tablet Take 0.5 tablets (12.5 mg total) by mouth daily. 45 tablet 3  ? torsemide (DEMADEX) 20 MG tablet Take 3 tablets (60 mg total) by mouth daily. 180 tablet 3  ? ?No current facility-administered medications for this encounter.  ? ?Allergies:   Ace inhibitors  ? ?Social History:  The patient  reports that he quit smoking about 45 years ago. His smoking use included cigarettes. He has never used smokeless tobacco. He reports current alcohol  use of about 1.0 standard drink per week. He reports that he does not use drugs.  ? ?Family History:  Father had high blood pressure and diabetes. Mother had no cardiac problems. No other pertinent family history.  ? ?Review of systems complete and found to be negative unless listed in HPI.   ? ?BP (!) 120/94   Pulse (!) 101   Ht 5\' 11"  (1.803 m)   Wt 83 kg (183 lb)   SpO2 99%   BMI 25.52 kg/m?  ? ?Wt Readings from Last 3 Encounters:  ?02/23/22 83 kg (183 lb)  ?02/22/22 84.1 kg (185 lb 6.4 oz)  ?02/20/22 74.8 kg (165 lb)  ? ? ?Physical Exam: ?General:  NAD. Mild dyspnea with speaking. ?HEENT: Normal ?Neck: Supple. JVP to ear. Carotids 2+ bilat; no bruits. No lymphadenopathy or thryomegaly appreciated. ?Cor: PMI nondisplaced. Regular rate & rhythm. No rubs, gallops or murmurs. ?Lungs: Clear ?Abdomen: nontender, +distended. No hepatosplenomegaly. No bruits or masses. Good bowel sounds. ?Extremities: No cyanosis, clubbing, rash, 2-3+ BLE edema to knees. ?Neuro: Alert & oriented x 3, cranial nerves grossly intact. Moves all 4 extremities w/o difficulty. Affect pleasant. ? ?ASSESSMENT AND PLAN: ?1.  Acute on Chronic systolic HF:  ?- Due to NICM. Suspect HTN in nature. Cath without significant CAD 2015. EF has been down since 2015.  ?- LHC (2015) no CAD ?- Echo 05/2018 LVEF 20-25%, Grade 1 DD.  ?- Echo (5/22): EF 20-25%m RV ok ?NYHA III. Wonder if recent COVID is also playing role Reds Clip 53%--57%. Volume status elevated. Give 80 mg IV lasix + metolazone 2.5 mg x 1 + 40 meq K now. No more torsemide tonight. ?- Tomorrow increase torsemide to 80 mg bid + increase KCL to 40 mEq bid. ?- Continue spiro 12.5 mg daily. ?- Continue Jardiance 10 mg daily. ?- Continue Entresto 97-103 mg bid.  ?- Continue Coreg 3.125 mg bid. He has not tolerated higher doses. ?- Pt intolerant to cMRI with claustrophobia.  ?- CPX 06/2017 with suboptimal effort.  ?- Continues to refuse ICD. ?- Check BMET and BNP.  ?- Set up repeat ECHO. ? ?2. HTN   ?- Stable. ? ?3. Noncompliance ?- Stress medication compliance.  ?- Consider referral back to paramedicine. ? ?4. H/O COVID ? - 01/2022.  ? ?Follow up with APP next week (ReDs and labs), consider re-referral to paramedicine and +/-Furoscix. ? ?Rafael Bihari, FNP  ?02/23/22 ?10:02 AM ? ?

## 2022-02-23 NOTE — Progress Notes (Signed)
ReDS Vest / Clip - 02/23/22 1200   ? ?  ? ReDS Vest / Clip  ? Station Marker C   ? Ruler Value 28   ? ReDS Value Range High volume overload   ? ReDS Actual Value 57   ? ?  ?  ? ?  ? ? ?

## 2022-02-23 NOTE — Progress Notes (Signed)
22 g IV inserted into L forearm, 3rd attempt, pt tolerated well, 80 mg IV Lasix administered. Pt administered 40 meq (2 tabs) of KCL PO and Metolazone 2.5 mg PO, tolerated all well. Pt given urinal and call bell, will continue to monitor ? ?Kevan Rosebush, RN, BSN, CHFN ?Specialty Coordinator ?Advanced Heart Failure Clinic ? ?

## 2022-02-23 NOTE — Progress Notes (Signed)
Pt urinated 300 cc, PIV d/c'd w/catheter intact, AVS reviewed w/pt and interpreter, pt verbalized understanding. Pt d/c'd from clinic ? ?Kevan Rosebush, RN, BSN, CHFN ?Specialty Coordinator ?Advanced [facility] Failure Clinic ? ?

## 2022-02-23 NOTE — Patient Instructions (Addendum)
Aumente la torasemida a 80 mg (4 tabletas) dos veces al d?a ? ?Aumente el potasio a 40 meq (2 pesta?as) dos veces al d?a ? ?Su m?dico le recomienda programar una cita de seguimiento en: la pr?xima semana el lunes 27/03/23 a la 1:30 p. m. ? ?Haz lo siguiente TODOS LOS D?AS: ?1) P?sate por la ma?ana antes del desayuno. An?telo y gu?rdelo en un registro. ?2) Tome sus medicamentos seg?n lo prescrito ?3) Coma alimentos bajos en sal: limite la sal (sodio) a 2000 mg por d?a. ?4) Mant?ngase tan activo como pueda todos los d?as ?5) Limite todos los l?quidos del d?a a menos de 2 litros ?

## 2022-02-26 ENCOUNTER — Encounter (HOSPITAL_COMMUNITY): Payer: Self-pay

## 2022-02-26 ENCOUNTER — Other Ambulatory Visit: Payer: Self-pay

## 2022-02-26 ENCOUNTER — Ambulatory Visit (HOSPITAL_COMMUNITY)
Admission: RE | Admit: 2022-02-26 | Discharge: 2022-02-26 | Disposition: A | Discharge status: Home / Self Care | Payer: Medicare Other | Source: Ambulatory Visit | Attending: Family Medicine | Admitting: Family Medicine

## 2022-02-26 VITALS — BP 102/70 | HR 96 | Wt 161.8 lb

## 2022-02-26 DIAGNOSIS — I110 Hypertensive heart disease with heart failure: Secondary | ICD-10-CM | POA: Diagnosis not present

## 2022-02-26 DIAGNOSIS — I5022 Chronic systolic (congestive) heart failure: Secondary | ICD-10-CM | POA: Diagnosis not present

## 2022-02-26 DIAGNOSIS — Z8249 Family history of ischemic heart disease and other diseases of the circulatory system: Secondary | ICD-10-CM | POA: Diagnosis not present

## 2022-02-26 DIAGNOSIS — F40240 Claustrophobia: Secondary | ICD-10-CM | POA: Insufficient documentation

## 2022-02-26 DIAGNOSIS — Z7984 Long term (current) use of oral hypoglycemic drugs: Secondary | ICD-10-CM | POA: Insufficient documentation

## 2022-02-26 DIAGNOSIS — I428 Other cardiomyopathies: Secondary | ICD-10-CM | POA: Diagnosis not present

## 2022-02-26 DIAGNOSIS — Z8616 Personal history of COVID-19: Secondary | ICD-10-CM | POA: Diagnosis not present

## 2022-02-26 DIAGNOSIS — I10 Essential (primary) hypertension: Secondary | ICD-10-CM | POA: Diagnosis not present

## 2022-02-26 DIAGNOSIS — Z79899 Other long term (current) drug therapy: Secondary | ICD-10-CM | POA: Diagnosis not present

## 2022-02-26 DIAGNOSIS — Z9114 Patient's other noncompliance with medication regimen: Secondary | ICD-10-CM | POA: Diagnosis not present

## 2022-02-26 LAB — BASIC METABOLIC PANEL
Anion gap: 6 (ref 5–15)
BUN: 22 mg/dL (ref 8–23)
CO2: 35 mmol/L — ABNORMAL HIGH (ref 22–32)
Calcium: 9.5 mg/dL (ref 8.9–10.3)
Chloride: 99 mmol/L (ref 98–111)
Creatinine, Ser: 1.68 mg/dL — ABNORMAL HIGH (ref 0.61–1.24)
GFR, Estimated: 45 mL/min — ABNORMAL LOW (ref >60)
Glucose, Bld: 91 mg/dL (ref 70–99)
Potassium: 5.1 mmol/L (ref 3.5–5.1)
Sodium: 140 mmol/L (ref 135–145)

## 2022-02-26 LAB — BRAIN NATRIURETIC PEPTIDE: B Natriuretic Peptide: 1119.2 pg/mL — ABNORMAL HIGH (ref 0.0–100.0)

## 2022-02-26 MED ORDER — METOLAZONE 2.5 MG PO TABS
2.5000 mg | ORAL_TABLET | ORAL | 5 refills | Status: DC
  Filled 2022-02-26: qty 4, 28d supply, fill #0
  Filled 2022-03-28: qty 4, 28d supply, fill #1
  Filled 2022-05-03: qty 4, 28d supply, fill #2
  Filled 2022-05-31: qty 4, 28d supply, fill #3

## 2022-02-26 NOTE — Patient Instructions (Signed)
Thank you for coming in today ? ?Labs were done today, if any labs are abnormal the clinic will call you ?No news is good news ? ?START Metolazone 2.5 mg 1 tablet with EXTRA dose of 40 meq of Potassium EVERY Friday ? ?Your physician recommends that you schedule a follow-up appointment in:  ?4-6 weeks  ?3-4 months with Dr. [name] with echocardiogram ? ?Your physician has requested that you have an echocardiogram. Echocardiography is a painless test that uses sound waves to create images of your heart. It provides your doctor with information about the size and shape of your heart and how well your heart?s chambers and valves are working. This procedure takes approximately one hour. There are no restrictions for this procedure. ?  ?At the [facility], you and your health needs are our priority. As part of our continuing mission to provide you with exceptional heart care, we have created designated Provider Care Teams. These Care Teams include your primary Cardiologist (physician) and Advanced Practice Providers (APPs- Physician Assistants and Nurse Practitioners) who all work together to provide you with the care you need, when you need it.  ? ?You may see any of the following providers on your designated Care Team at your next follow up: ?Dr Glori Bickers ?Dr Loralie Champagne ?Darrick Grinder, NP ?Lyda Jester, PA ?Jessica Milford,NP ?Marlyce Huge, PA ?Audry Riles, PharmD ? ? ?Please be sure to bring in all your medications bottles to every appointment.  ? ? ?If you have any questions or concerns before your next appointment please send [place] a message through [place] or call our office at [phone].   ? ?TO LEAVE A MESSAGE FOR THE NURSE SELECT OPTION 2, PLEASE LEAVE A MESSAGE INCLUDING: ?YOUR NAME ?DATE OF BIRTH ?CALL BACK NUMBER ?REASON FOR CALL**this is important as we prioritize the call backs ? ?YOU WILL RECEIVE A CALL BACK THE SAME DAY AS LONG AS YOU CALL BEFORE 4:00 PM ? ?

## 2022-02-26 NOTE — Progress Notes (Addendum)
?  ?Advanced Heart Failure Clinic Note  ? ? ?Date:  02/26/2022  ? ?ID:  Joel Beck, DOB 16-May-1956, MRN [id number] ? ?PCP:  Arma Heading, MD  ?Cardiologist:  Dr. Johnsie Cancel ?HF Cardiologist: Dr. [name] ?  ?History of Present Illness: ?Joel Beck is a 66 y.o. male with NICM, HTN,  and chronic systolic heart failure diagnosed in 2015.   ? ?Admitted 5/22 for A/CHF. Diuresed with IV lasix. Repeat echo EF 20-25%. Discharged on po lasix prn.  ? ?Saw Dr [name] 06/21/21 and was volume overloaded. He was switched from lasix to torsemide 40 daily and instructed to take metolazone 2.5 mg for 2 days.  ? ?Returned for HF follow up 8/22 and was taking lasix and torsemide daily and had stopped his spiro. He was referred to paramedicine to help with medication. Unable to start Paramedicine because he said he had to many appointments.  ? ?Saw Dr [name] 10/2021 and doing fine. NYHA II.  ? ?Evaluated in the ED 01/23/22 for increased dyspnea. Had [place] 01/23/22 and has not felt good since that time. Given IV lasix and started on Legevrio for COVID. Discharged to home.  ? ?Evaluated in the ED 02/20/22 for abdominal bloating. Started on GI cocktail. Discharged to home later that day.  ? ?Follow up 02/22/22 volume overloaded, given 80 mg IV lasix + 40 KCL, ReDs 53%. Remained volume overloaded at follow up next day, ReDs 57% (? Accuracy), weight down 2 lbs. Given another dose of IV Lasix 80 mg + metolazone 2.5 mg x 1 + extra 40 KCL and increased daily torsemide to 80 bid starting the following day. ? ?Today he returns for HF follow up with the interpretor. Overall feeling better, diuresed 22 lbs. Feels weak. Breathing and swelling has improved. Denies palpitations, CP, dizziness, or PND/Orthopnea. Appetite ok. No fever or chills. Weight at home 180 pounds. Taking all medications. Watching his salt and fluid intake. ? ?Cardiac studies: ?- Echo (8/15): EF 25%  ?- Echo (7/17): EF 20-25%  ?- Echo (6/18): EF 20-25% Grade II DD ?- Echo  (6/19): EF 20-25%, Grade I DD ?- Echo (5/22): EF 20-25%, RV ok. ?- Cath by Dr Martinique 2015 with no CAD ? ?Unable to tolerate cMRI due to claustrophobia.  ? ?- CPX Test (7/18): ?Peak VO2: 20.6 (74% predicted peak VO2) ?VE/VCO2 slope:  28 ?OUES: 2.55 ?Peak RER: 0.96 ?Submax test. Mildly reduced functional capacity. Mild chrontropic incompetence in setting of submax effort. No obvious HF or pulmonary limitation noted.  ? ?Review of systems complete and found to be negative unless listed in HPI.  ? ?Past Medical History:  ?Diagnosis Date  ? CHF (congestive heart failure) ([place]) 07/05/2014  ? Hypertension Dx 2015  ? ?Past Surgical History:  ?Procedure Laterality Date  ? LEFT HEART CATHETERIZATION WITH CORONARY ANGIOGRAM N/A 07/05/2014  ? Procedure: LEFT HEART CATHETERIZATION WITH CORONARY ANGIOGRAM;  Surgeon: Peter M Martinique, MD;  Location: [facility] CATH LAB;  Service: Cardiovascular;  Laterality: N/A;  ? ?Current Outpatient Medications  ?Medication Sig Dispense Refill  ? aspirin EC 81 MG tablet Take 1 tablet (81 mg total) by mouth daily. 90 tablet 3  ? carvedilol (COREG) 3.125 MG tablet Take 1 tablet (3.125 mg total) by mouth 2 (two) times daily. 60 tablet 3  ? empagliflozin (JARDIANCE) 10 MG TABS tablet Take 1 tablet (10 mg total) by mouth daily before breakfast. 30 tablet 3  ? famotidine (PEPCID) 20 MG tablet Take 1 tablet (20 mg total) by mouth  2 (two) times daily. 30 tablet 0  ? Multiple Vitamins-Minerals (CENTRUM MEN) TABS Take 1 tablet by mouth daily.    ? potassium chloride SA (KLOR-CON M) 20 MEQ tablet Take 2 tablets (40 mEq total) by mouth 2 (two) times daily. 120 tablet 3  ? sacubitril-valsartan (ENTRESTO) 97-103 MG TAKE 1 TABLET BY MOUTH 2 (TWO) TIMES DAILY. MUST KEEP PENDING APPOINTMENT FOR FURTHER REFILLS 60 tablet 3  ? spironolactone (ALDACTONE) 25 MG tablet Take 0.5 tablets (12.5 mg total) by mouth daily. 45 tablet 3  ? torsemide (DEMADEX) 20 MG tablet Take 4 tablets (80 mg total) by mouth 2 (two) times daily. 240  tablet 3  ? ?No current facility-administered medications for this encounter.  ? ?Allergies:   Ace inhibitors  ? ?Social History:  The patient  reports that he quit smoking about 45 years ago. His smoking use included cigarettes. He has never used smokeless tobacco. He reports current alcohol use of about 1.0 standard drink per week. He reports that he does not use drugs.  ? ?Family History:  Father had high blood pressure and diabetes. Mother had no cardiac problems. No other pertinent family history.  ? ?Review of systems complete and found to be negative unless listed in HPI.   ? ?BP 102/70   Pulse 96   Wt 73.4 kg (161 lb 12.8 oz)   SpO2 98%   BMI 22.57 kg/m?  ? ?Wt Readings from Last 3 Encounters:  ?02/26/22 73.4 kg (161 lb 12.8 oz)  ?02/23/22 83 kg (183 lb)  ?02/22/22 84.1 kg (185 lb 6.4 oz)  ? ? ?Physical Exam: ?General:  NAD. No resp difficulty ?HEENT: Normal ?Neck: Supple. No JVD. Carotids 2+ bilat; no bruits. No lymphadenopathy or thryomegaly appreciated. ?Cor: PMI nondisplaced. Regular rate & rhythm. No rubs, gallops or murmurs. ?Lungs: Clear ?Abdomen: Soft, nontender, nondistended. No hepatosplenomegaly. No bruits or masses. Good bowel sounds. ?Extremities: No cyanosis, clubbing, rash, edema ?Neuro: Alert & oriented x 3, cranial nerves grossly intact. Moves all 4 extremities w/o difficulty. Affect pleasant. ? ?ReDs: 40% ? ?ASSESSMENT AND PLAN: ?1.  Chronic Systolic HF:  ?- Due to NICM. Suspect HTN in nature. EF has been down since 2015.  ?- LHC (2015) no CAD. ?- CPX (7/18) with suboptimal effort.  ?- Echo (6/19): LVEF 20-25%, Grade 1 DD.  ?- Echo (5/22): EF 20-25%, RV ok ?- Improved NYHA II. Volume much-improved, weight down 22 lbs. ReDs 57-->40%. No room to increase GDMT. ?- Continue torsemide 80 mg bid + KCL 40 mEq bid. ?- Add metolazone + extra 40 KCL weekly (Fridays). ?- Continue spironolactone 12.5 mg daily. ?- Continue Jardiance 10 mg daily. ?- Continue Entresto 97-103 mg bid.  ?- Continue Coreg  3.125 mg bid. He has not tolerated higher doses. ?- Pt intolerant to cMRI with claustrophobia.  ?- Continues to refuse ICD. ?- Check BMET and BNP.  ?- Set up repeat ECHO. ? ?2. HTN  ?- Stable. ? ?3. Noncompliance ?- Stress medication compliance.  ?- Consider referral back to paramedicine (Furoscix?). ? ?4. H/O COVID ? - 01/2022.  ? ?Follow up with APP in 4-6 weeks and 3-4 months with Dr. [name].  ? ?Rafael Bihari, FNP  ?02/26/22 ?1:39 PM ? ?

## 2022-02-26 NOTE — Progress Notes (Signed)
ReDS Vest / Clip - 02/26/22 1300   ? ?  ? ReDS Vest / Clip  ? Station Marker C   ? Ruler Value 27   ? ReDS Value Range Moderate volume overload   ? ReDS Actual Value 40   ? ?  ?  ? ?  ? ? ?

## 2022-03-08 ENCOUNTER — Telehealth (HOSPITAL_COMMUNITY): Payer: Self-pay

## 2022-03-08 ENCOUNTER — Other Ambulatory Visit: Payer: Self-pay

## 2022-03-08 MED ORDER — POTASSIUM CHLORIDE CRYS ER 20 MEQ PO TBCR
EXTENDED_RELEASE_TABLET | ORAL | 3 refills | Status: DC
  Filled 2022-03-08: qty 120, 40d supply, fill #0
  Filled 2022-08-01: qty 120, 37d supply, fill #0
  Filled 2022-10-30: qty 120, 37d supply, fill #1

## 2022-03-08 NOTE — Telephone Encounter (Signed)
Patient advised and verbalized understanding. Med list updated to reflect changes.  ? ?

## 2022-03-08 NOTE — Telephone Encounter (Signed)
-----   Message from Rafael Bihari, [place] sent at 02/26/2022  4:01 PM EDT ----- ?Labs stable. K high end of normal. Please decrease daily KCL suppl to 40 q AM/20 q PM. ? ?Make sure to take extra 40 KCL on metolazone days (Fridays). ? ?BNP improved. ?

## 2022-03-09 ENCOUNTER — Other Ambulatory Visit: Payer: Self-pay

## 2022-03-16 ENCOUNTER — Other Ambulatory Visit: Payer: Self-pay

## 2022-03-28 ENCOUNTER — Other Ambulatory Visit: Payer: Self-pay

## 2022-03-28 ENCOUNTER — Other Ambulatory Visit (HOSPITAL_COMMUNITY): Payer: Self-pay | Admitting: Adult Health

## 2022-03-30 ENCOUNTER — Telehealth (HOSPITAL_COMMUNITY): Payer: Self-pay

## 2022-03-30 NOTE — Progress Notes (Incomplete)
?  ?Advanced Heart Failure Clinic Note  ? ? ?Date:  03/30/2022  ? ?ID:  Joel Beck, DOB 01-Mar-1956, MRN [id number] ? ?PCP:  Arma Heading, MD  ?Cardiologist:  Dr. Johnsie Cancel ?HF Cardiologist: Dr. [name] ?  ?HPI: ?Joel Beck is a 66 y.o. male with NICM, HTN,  and chronic systolic heart failure diagnosed in 2015.   ? ?Admitted 5/22 for A/CHF. Diuresed with IV lasix. Repeat echo EF 20-25%. Discharged on po lasix prn.  ? ?Saw Dr [name] 06/21/21 and was volume overloaded. He was switched from lasix to torsemide 40 daily and instructed to take metolazone 2.5 mg for 2 days.  ? ?Returned for HF follow up 8/22 and was taking lasix and torsemide daily and had stopped his spiro. He was referred to paramedicine to help with medication. Unable to start Paramedicine because he said he had to many appointments.  ? ?Saw Dr [name] 10/2021 and doing fine. NYHA II.  ? ?Evaluated in the ED 01/23/22 for increased dyspnea. Had [place] 01/23/22 and has not felt good since that time. Given IV lasix and started on Legevrio for COVID. Discharged to home.  ? ?Evaluated in the ED 02/20/22 for abdominal bloating. Started on GI cocktail. Discharged to home later that day.  ? ?Follow up 02/22/22 volume overloaded, given 80 mg IV lasix + 40 KCL, ReDs 53%. Remained volume overloaded at follow up next day, ReDs 57% (? Accuracy), weight down 2 lbs. Given another dose of IV Lasix 80 mg + metolazone 2.5 mg x 1 + extra 40 KCL and increased daily torsemide to 80 bid starting the following day. ? ?Today he returns for HF follow up with the interpretor. Overall feeling better, diuresed 22 lbs. Feels weak. Breathing and swelling has improved. Denies palpitations, CP, dizziness, or PND/Orthopnea. Appetite ok. No fever or chills. Weight at home 180 pounds. Taking all medications. Watching his salt and fluid intake. ? ?Cardiac studies: ?- Echo (8/15): EF 25%  ?- Echo (7/17): EF 20-25%  ?- Echo (6/18): EF 20-25% Grade II DD ?- Echo (6/19): EF 20-25%, Grade  I DD ?- Echo (5/22): EF 20-25%, RV ok. ?- Cath by Dr Martinique 2015 with no CAD ? ?Unable to tolerate cMRI due to claustrophobia.  ? ?- CPX Test (7/18): ?Peak VO2: 20.6 (74% predicted peak VO2) ?VE/VCO2 slope:  28 ?OUES: 2.55 ?Peak RER: 0.96 ?Submax test. Mildly reduced functional capacity. Mild chrontropic incompetence in setting of submax effort. No obvious HF or pulmonary limitation noted.  ? ?Review of systems complete and found to be negative unless listed in HPI.  ? ?Past Medical History:  ?Diagnosis Date  ? CHF (congestive heart failure) ([place]) 07/05/2014  ? Hypertension Dx 2015  ? ?Past Surgical History:  ?Procedure Laterality Date  ? LEFT HEART CATHETERIZATION WITH CORONARY ANGIOGRAM N/A 07/05/2014  ? Procedure: LEFT HEART CATHETERIZATION WITH CORONARY ANGIOGRAM;  Surgeon: Peter M Martinique, MD;  Location: [facility] CATH LAB;  Service: Cardiovascular;  Laterality: N/A;  ? ?Current Outpatient Medications  ?Medication Sig Dispense Refill  ? aspirin EC 81 MG tablet Take 1 tablet (81 mg total) by mouth daily. 90 tablet 3  ? carvedilol (COREG) 3.125 MG tablet Take 1 tablet (3.125 mg total) by mouth 2 (two) times daily. 60 tablet 3  ? empagliflozin (JARDIANCE) 10 MG TABS tablet Take 1 tablet (10 mg total) by mouth daily before breakfast. 30 tablet 3  ? famotidine (PEPCID) 20 MG tablet Take 1 tablet (20 mg total) by mouth 2 (two) times  daily. 30 tablet 0  ? metolazone (ZAROXOLYN) 2.5 MG tablet Take 1 tablet (2.5 mg total) by mouth once a week. 1 tablet every Friday with extra 40 meq of Potassium 4 tablet 5  ? Multiple Vitamins-Minerals (CENTRUM MEN) TABS Take 1 tablet by mouth daily.    ? potassium chloride SA (KLOR-CON M) 20 MEQ tablet Take 2 tablets (40 mEq total) by mouth every morning AND 1 tablet (20 mEq total) every evening. Take an extra 2 tablets when taking Metolazone. 120 tablet 3  ? sacubitril-valsartan (ENTRESTO) 97-103 MG TAKE 1 TABLET BY MOUTH 2 (TWO) TIMES DAILY. MUST KEEP PENDING APPOINTMENT FOR FURTHER REFILLS 60  tablet 3  ? spironolactone (ALDACTONE) 25 MG tablet Take 0.5 tablets (12.5 mg total) by mouth daily. 45 tablet 3  ? torsemide (DEMADEX) 20 MG tablet Take 4 tablets (80 mg total) by mouth 2 (two) times daily. 240 tablet 3  ? ?No current facility-administered medications for this visit.  ? ?Allergies:   Ace inhibitors  ? ?Social History:  The patient  reports that he quit smoking about 45 years ago. His smoking use included cigarettes. He has never used smokeless tobacco. He reports current alcohol use of about 1.0 standard drink per week. He reports that he does not use drugs.  ? ?Family History:  Father had high blood pressure and diabetes. Mother had no cardiac problems. No other pertinent family history.  ? ?Review of systems complete and found to be negative unless listed in HPI.   ? ?There were no vitals taken for this visit. ? ?Wt Readings from Last 3 Encounters:  ?02/26/22 73.4 kg (161 lb 12.8 oz)  ?02/23/22 83 kg (183 lb)  ?02/22/22 84.1 kg (185 lb 6.4 oz)  ? ? ?Physical Exam: ?General:  NAD. No resp difficulty ?HEENT: Normal ?Neck: Supple. No JVD. Carotids 2+ bilat; no bruits. No lymphadenopathy or thryomegaly appreciated. ?Cor: PMI nondisplaced. Regular rate & rhythm. No rubs, gallops or murmurs. ?Lungs: Clear ?Abdomen: Soft, nontender, nondistended. No hepatosplenomegaly. No bruits or masses. Good bowel sounds. ?Extremities: No cyanosis, clubbing, rash, edema ?Neuro: Alert & oriented x 3, cranial nerves grossly intact. Moves all 4 extremities w/o difficulty. Affect pleasant. ? ?ReDs: 40% ? ?ASSESSMENT AND PLAN: ?1.  Chronic Systolic HF:  ?- Due to NICM. Suspect HTN in nature. EF has been down since 2015.  ?- LHC (2015) no CAD. ?- CPX (7/18) with suboptimal effort.  ?- Echo (6/19): LVEF 20-25%, Grade 1 DD.  ?- Echo (5/22): EF 20-25%, RV ok ?- Improved NYHA II. Volume much-improved, weight down 22 lbs. ReDs 57-->40%. No room to increase GDMT. ?- Continue torsemide 80 mg bid + KCL 40 mEq bid. ?- Add  metolazone + extra 40 KCL weekly (Fridays). ?- Continue spironolactone 12.5 mg daily. ?- Continue Jardiance 10 mg daily. ?- Continue Entresto 97-103 mg bid.  ?- Continue Coreg 3.125 mg bid. He has not tolerated higher doses. ?- Pt intolerant to cMRI with claustrophobia.  ?- Continues to refuse ICD. ?- Check BMET and BNP.  ?- Set up repeat ECHO. ? ?2. HTN  ?- Stable. ? ?3. Noncompliance ?- Stress medication compliance.  ?- Consider referral back to paramedicine (Furoscix?). ? ?4. H/O COVID ? - 01/2022.  ? ?Follow up with APP in 4-6 weeks and 3-4 months with Dr. [name].  ? ?Rafael Bihari, FNP  ?03/30/22 ?8:52 AM ? ?

## 2022-03-30 NOTE — Telephone Encounter (Signed)
Called and left patient a detailed voice message to confirm/remind patient of their appointment at the [facility] on 04/02/22.  ? ?  ? ?

## 2022-04-02 ENCOUNTER — Encounter (HOSPITAL_COMMUNITY): Payer: Medicare Other

## 2022-04-03 IMAGING — CR DG CHEST 2V
2 series · 2 of 2 positions shown · non-contrast
Comparison: 01/14/2016

CLINICAL DATA: Shortness of breath and chest pain

EXAM:
CHEST - 2 VIEW

[chest pa]
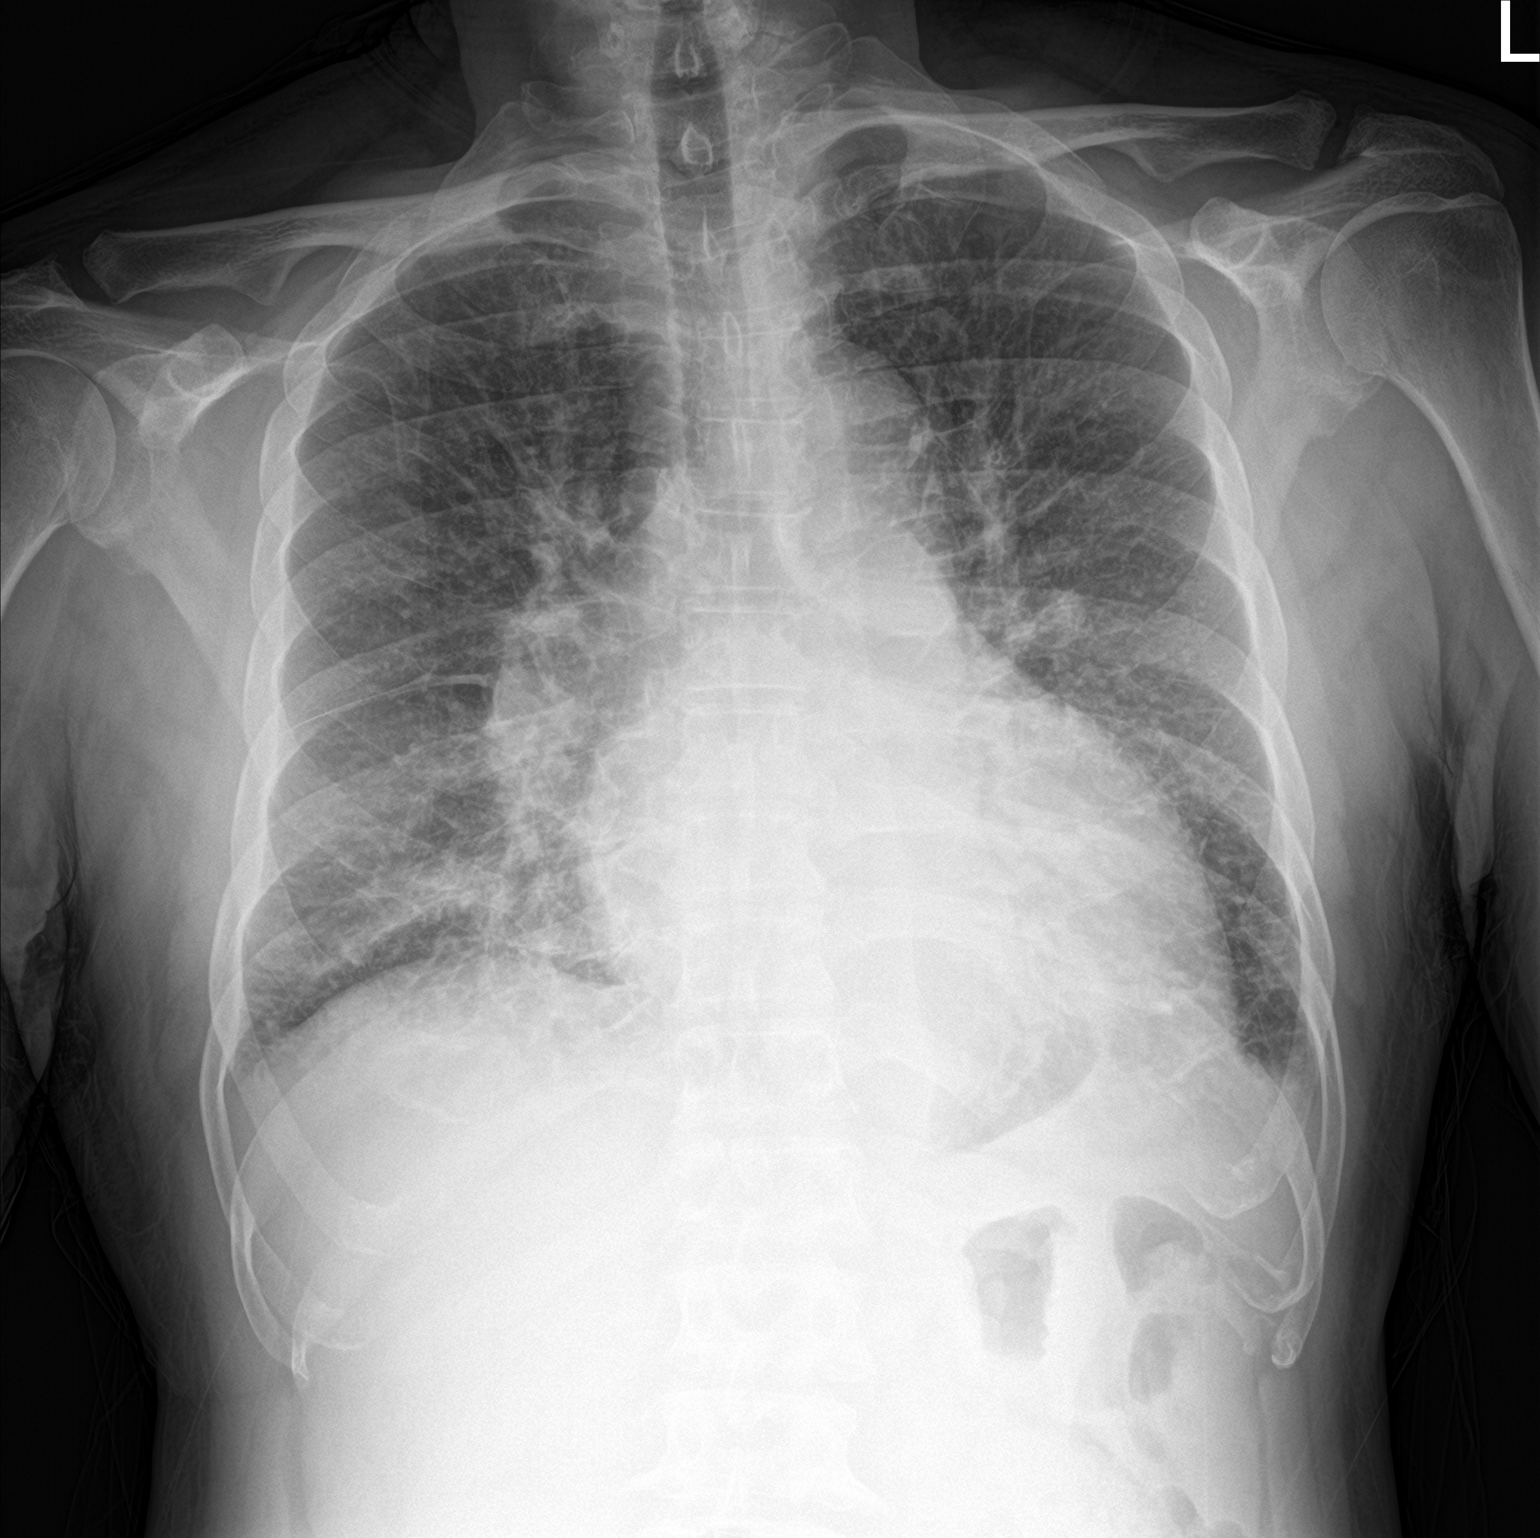

[chest lat]
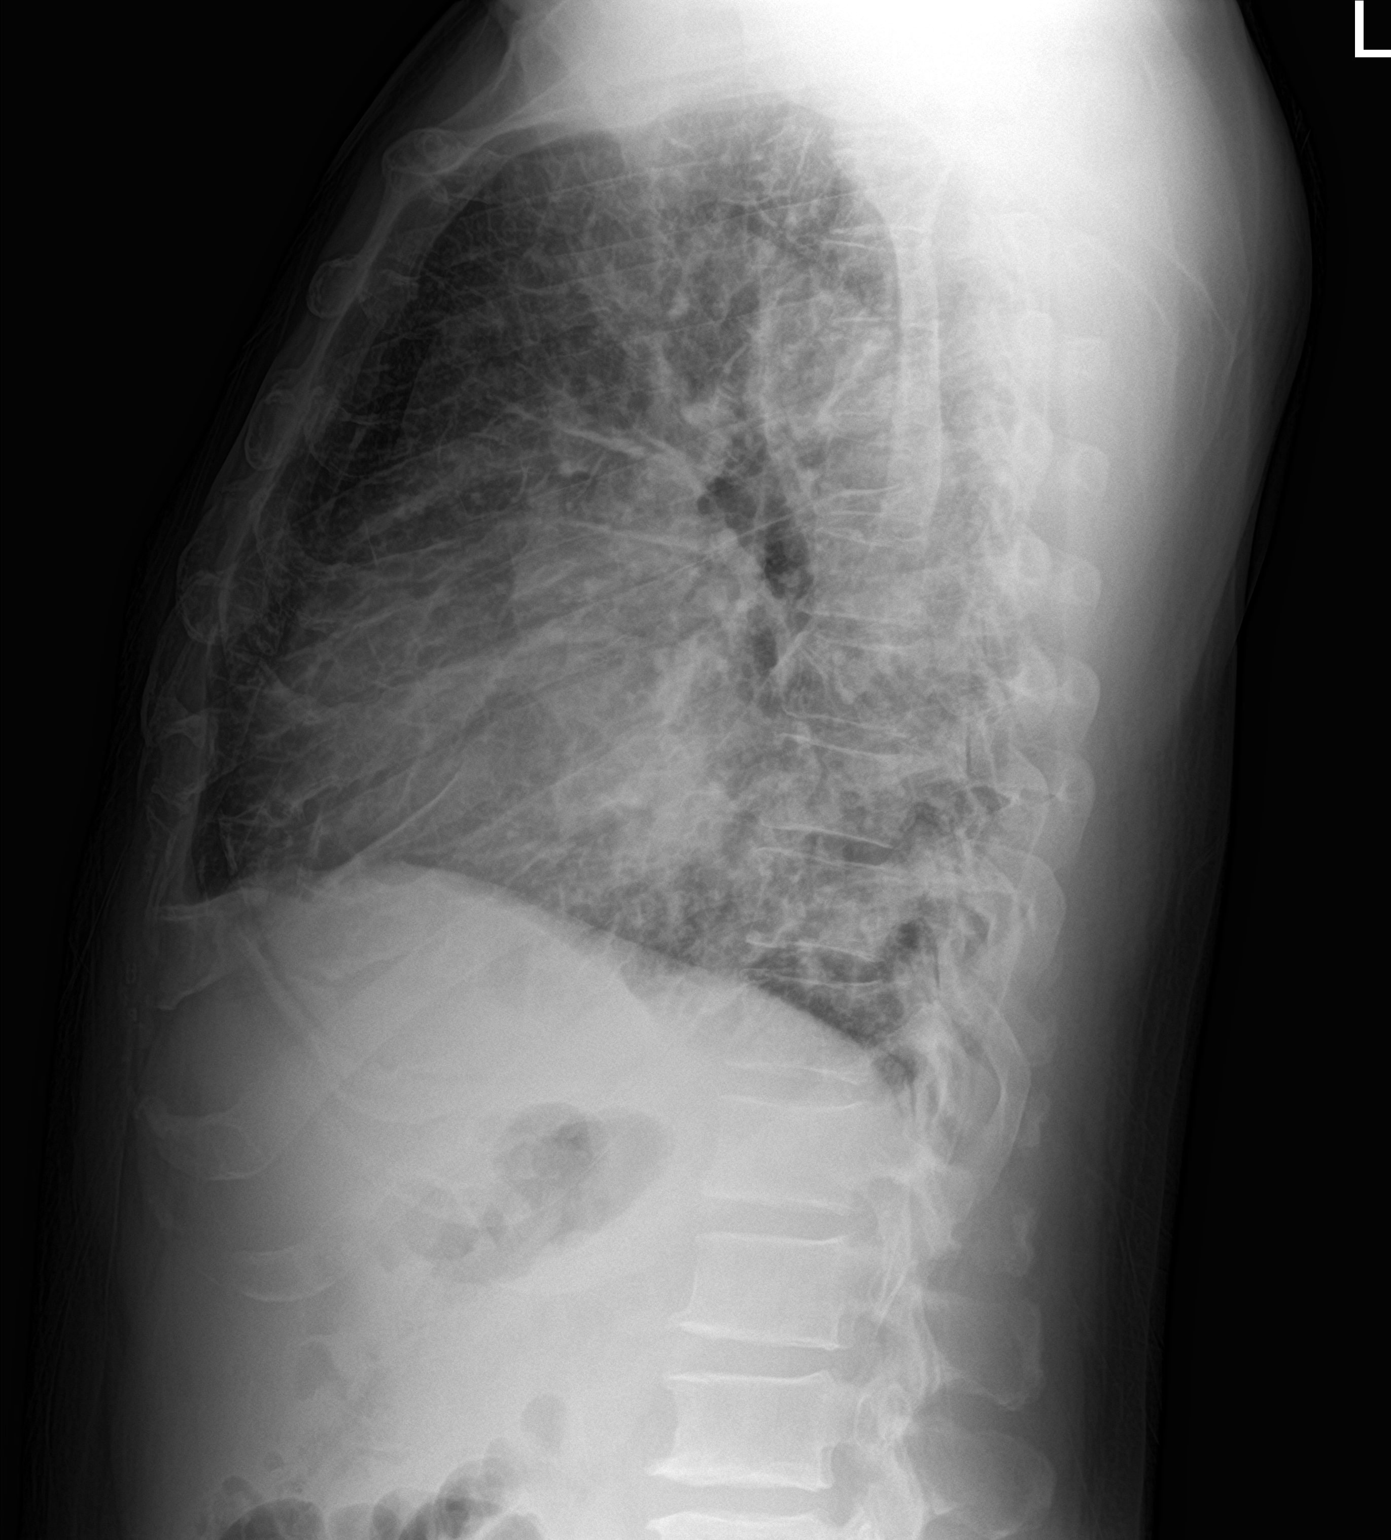

[2 of 2 positions shown; findings below may reference images not displayed]

FINDINGS: Cardiomegaly. Hilar vascular fullness. Diffuse interstitial opacity
with Kerley lines and fissure thickening.
IMPRESSION: CHF.

## 2022-04-17 ENCOUNTER — Ambulatory Visit: Payer: Medicare Other | Admitting: Emergency Medicine

## 2022-05-03 ENCOUNTER — Other Ambulatory Visit: Payer: Self-pay

## 2022-05-03 ENCOUNTER — Other Ambulatory Visit (HOSPITAL_COMMUNITY): Payer: Self-pay | Admitting: Family Medicine

## 2022-05-03 MED ORDER — TORSEMIDE 20 MG PO TABS
80.0000 mg | ORAL_TABLET | Freq: Two times a day (BID) | ORAL | 3 refills | Status: DC
  Filled 2022-05-03: qty 240, 30d supply, fill #0

## 2022-05-10 ENCOUNTER — Other Ambulatory Visit: Payer: Self-pay

## 2022-05-14 ENCOUNTER — Other Ambulatory Visit: Payer: Self-pay

## 2022-05-14 ENCOUNTER — Other Ambulatory Visit (HOSPITAL_COMMUNITY): Payer: Self-pay

## 2022-05-14 ENCOUNTER — Other Ambulatory Visit (HOSPITAL_COMMUNITY): Payer: Self-pay | Admitting: Internal Medicine

## 2022-05-14 MED ORDER — ENTRESTO 97-103 MG PO TABS
1.0000 | ORAL_TABLET | Freq: Two times a day (BID) | ORAL | 11 refills | Status: DC
  Filled 2022-06-25: qty 60, 30d supply, fill #0
  Filled 2022-08-14: qty 60, 30d supply, fill #1
  Filled 2022-10-01 – 2022-10-05 (×2): qty 60, 30d supply, fill #2
  Filled 2022-11-09: qty 60, 30d supply, fill #3
  Filled 2023-01-14: qty 60, 30d supply, fill #4
  Filled 2023-02-27: qty 60, 30d supply, fill #5
  Filled 2023-03-27: qty 60, 30d supply, fill #6

## 2022-05-14 MED ORDER — ENTRESTO 97-103 MG PO TABS
1.0000 | ORAL_TABLET | Freq: Two times a day (BID) | ORAL | 1 refills | Status: DC
  Filled 2022-05-14: qty 60, 30d supply, fill #0

## 2022-05-30 ENCOUNTER — Other Ambulatory Visit (HOSPITAL_COMMUNITY): Payer: Medicare Other

## 2022-05-31 ENCOUNTER — Encounter (HOSPITAL_COMMUNITY): Payer: Self-pay | Admitting: Internal Medicine

## 2022-05-31 ENCOUNTER — Ambulatory Visit (HOSPITAL_BASED_OUTPATIENT_CLINIC_OR_DEPARTMENT_OTHER)
Admission: RE | Admit: 2022-05-31 | Discharge: 2022-05-31 | Disposition: A | Discharge status: Home / Self Care | Payer: Medicare Other | Source: Ambulatory Visit | Attending: Family Medicine | Admitting: Family Medicine

## 2022-05-31 ENCOUNTER — Ambulatory Visit (HOSPITAL_COMMUNITY)
Admission: RE | Admit: 2022-05-31 | Discharge: 2022-05-31 | Disposition: A | Discharge status: Home / Self Care | Payer: Medicare Other | Source: Ambulatory Visit | Attending: Internal Medicine | Admitting: Internal Medicine

## 2022-05-31 ENCOUNTER — Other Ambulatory Visit: Payer: Self-pay

## 2022-05-31 VITALS — BP 124/80 | HR 85 | Wt 182.4 lb

## 2022-05-31 DIAGNOSIS — I428 Other cardiomyopathies: Secondary | ICD-10-CM | POA: Diagnosis not present

## 2022-05-31 DIAGNOSIS — I5023 Acute on chronic systolic (congestive) heart failure: Secondary | ICD-10-CM | POA: Diagnosis not present

## 2022-05-31 DIAGNOSIS — Z7984 Long term (current) use of oral hypoglycemic drugs: Secondary | ICD-10-CM | POA: Insufficient documentation

## 2022-05-31 DIAGNOSIS — I5022 Chronic systolic (congestive) heart failure: Secondary | ICD-10-CM | POA: Diagnosis present

## 2022-05-31 DIAGNOSIS — I110 Hypertensive heart disease with heart failure: Secondary | ICD-10-CM | POA: Insufficient documentation

## 2022-05-31 DIAGNOSIS — F40240 Claustrophobia: Secondary | ICD-10-CM | POA: Insufficient documentation

## 2022-05-31 DIAGNOSIS — Z79899 Other long term (current) drug therapy: Secondary | ICD-10-CM | POA: Diagnosis not present

## 2022-05-31 DIAGNOSIS — Z91148 Patient's other noncompliance with medication regimen for other reason: Secondary | ICD-10-CM

## 2022-05-31 DIAGNOSIS — Z91199 Patient's noncompliance with other medical treatment and regimen due to unspecified reason: Secondary | ICD-10-CM | POA: Diagnosis not present

## 2022-05-31 LAB — COMPREHENSIVE METABOLIC PANEL
ALT: 56 U/L — ABNORMAL HIGH (ref 0–44)
AST: 31 U/L (ref 15–41)
Albumin: 3.9 g/dL (ref 3.5–5.0)
Alkaline Phosphatase: 74 U/L (ref 38–126)
Anion gap: 6 (ref 5–15)
BUN: 24 mg/dL — ABNORMAL HIGH (ref 8–23)
CO2: 26 mmol/L (ref 22–32)
Calcium: 9.1 mg/dL (ref 8.9–10.3)
Chloride: 109 mmol/L (ref 98–111)
Creatinine, Ser: 1.42 mg/dL — ABNORMAL HIGH (ref 0.61–1.24)
GFR, Estimated: 55 mL/min — ABNORMAL LOW (ref >60)
Glucose, Bld: 108 mg/dL — ABNORMAL HIGH (ref 70–99)
Potassium: 4.6 mmol/L (ref 3.5–5.1)
Sodium: 141 mmol/L (ref 135–145)
Total Bilirubin: 1.2 mg/dL (ref 0.3–1.2)
Total Protein: 6.7 g/dL (ref 6.5–8.1)

## 2022-05-31 LAB — ECHOCARDIOGRAM COMPLETE
Area-P 1/2: 3.74 cm2
Calc EF: 27.6 %
MV M vel: 4.67 m/s
MV Peak grad: 87 mmHg
P 1/2 time: 379 msec
S' Lateral: 6 cm
Single Plane A2C EF: 26.8 %
Single Plane A4C EF: 27.8 %

## 2022-05-31 LAB — CBC
HCT: 44.8 % (ref 39.0–52.0)
Hemoglobin: 14.6 g/dL (ref 13.0–17.0)
MCH: 31.9 pg (ref 26.0–34.0)
MCHC: 32.6 g/dL (ref 30.0–36.0)
MCV: 98 fL (ref 80.0–100.0)
Platelets: 152 10*3/uL (ref 150–400)
RBC: 4.57 MIL/uL (ref 4.22–5.81)
RDW: 14.9 % (ref 11.5–15.5)
WBC: 6.2 10*3/uL (ref 4.0–10.5)
nRBC: 0 % (ref 0.0–0.2)

## 2022-05-31 LAB — BRAIN NATRIURETIC PEPTIDE: B Natriuretic Peptide: 3659.3 pg/mL — ABNORMAL HIGH (ref 0.0–100.0)

## 2022-05-31 MED ORDER — TORSEMIDE 20 MG PO TABS
60.0000 mg | ORAL_TABLET | Freq: Every day | ORAL | 3 refills | Status: DC
  Filled 2022-05-31: qty 240, 80d supply, fill #0
  Filled 2022-10-30: qty 240, 80d supply, fill #1

## 2022-05-31 NOTE — Progress Notes (Signed)
Advanced Heart Failure Clinic Note    Date:  05/31/2022   ID:  [name], DOB 27-Oct-1956, MRN [id number]  PCP:  Arma Heading, MD  Cardiologist:  Dr. Johnsie Cancel HF Cardiologist: Dr. [name]   History of Present Illness: Joel Beck is a 67 y.o. male with NICM, HTN,  and chronic systolic heart failure diagnosed in 2015.    Admitted 5/22 for A/CHF. Diuresed with IV lasix. Repeat echo EF 20-25%. Discharged on po lasix prn.   Saw Dr [name] 06/21/21 and was volume overloaded. He was switched from lasix to torsemide 40 daily and instructed to take metolazone 2.5 mg for 2 days.   Returned for HF follow up 8/22 and was taking lasix and torsemide daily and had stopped his spiro. He was referred to paramedicine to help with medication. Unable to start Paramedicine because he said he had to many appointments.   Saw Dr [name] 10/2021 and doing fine. NYHA II.   Evaluated in the ED 01/23/22 for increased dyspnea. Had [place] 01/23/22 and has not felt good since that time. Given IV lasix and started on Legevrio for COVID. Discharged to home.   Evaluated in the ED 02/20/22 for abdominal bloating. Started on GI cocktail. Discharged to home later that day.   Follow up 02/22/22 volume overloaded, given 80 mg IV lasix + 40 KCL, ReDs 53%. Remained volume overloaded at follow up next day, ReDs 57% (? Accuracy), weight down 2 lbs. Given another dose of IV Lasix 80 mg + metolazone 2.5 mg x 1 + extra 40 KCL and increased daily torsemide to 80 bid starting the following day.  Today he returns for HF follow up with the interpreter. Says he feels so-so. Gets SOB easily. Not taking torsemide x 2 months. Weight up 20 pounds. Feels bloated. No CP.   Echo today 05/31/22 EF < 20% RV moderately down Severe MR. Mod-severe TR IVC markedly dilated Personally reviewed    Cardiac studies: - Echo (8/15): EF 25%  - Echo (7/17): EF 20-25%  - Echo (6/18): EF 20-25% Grade II DD - Echo (6/19): EF 20-25%, Grade I  DD - Echo (5/22): EF 20-25%, RV ok. - Cath by Dr Martinique 2015 with no CAD  Unable to tolerate cMRI due to claustrophobia.   - CPX Test (7/18): Peak VO2: 20.6 (74% predicted peak VO2) VE/VCO2 slope:  28 OUES: 2.55 Peak RER: 0.96 Submax test. Mildly reduced functional capacity. Mild chrontropic incompetence in setting of submax effort. No obvious HF or pulmonary limitation noted.   Review of systems complete and found to be negative unless listed in HPI.   Past Medical History:  Diagnosis Date   CHF (congestive heart failure) ([place]) 07/05/2014   Hypertension Dx 2015   Past Surgical History:  Procedure Laterality Date   LEFT HEART CATHETERIZATION WITH CORONARY ANGIOGRAM N/A 07/05/2014   Procedure: LEFT HEART CATHETERIZATION WITH CORONARY ANGIOGRAM;  Surgeon: Peter M Martinique, MD;  Location: [facility] CATH LAB;  Service: Cardiovascular;  Laterality: N/A;   Current Outpatient Medications  Medication Sig Dispense Refill   aspirin EC 81 MG tablet Take 1 tablet (81 mg total) by mouth daily. 90 tablet 3   carvedilol (COREG) 3.125 MG tablet Take 1 tablet (3.125 mg total) by mouth 2 (two) times daily. 60 tablet 3   metolazone (ZAROXOLYN) 2.5 MG tablet Take 1 tablet (2.5 mg total) by mouth once a week. 1 tablet every Friday with extra 40 meq of Potassium 4 tablet 5  Multiple Vitamins-Minerals (CENTRUM MEN) TABS Take 1 tablet by mouth daily.     potassium chloride SA (KLOR-CON M) 20 MEQ tablet Take 2 tablets (40 mEq total) by mouth every morning AND 1 tablet (20 mEq total) every evening. Take an extra 2 tablets when taking Metolazone. 120 tablet 3   sacubitril-valsartan (ENTRESTO) 97-103 MG Take 1 tablet by mouth 2 (two) times daily. 60 tablet 11   spironolactone (ALDACTONE) 25 MG tablet Take 0.5 tablets (12.5 mg total) by mouth daily. 45 tablet 3   torsemide (DEMADEX) 20 MG tablet Take 4 tablets (80 mg total) by mouth 2 (two) times daily. 240 tablet 3   No current facility-administered medications for this  encounter.   Allergies:   Ace inhibitors   Social History:  The patient  reports that he quit smoking about 45 years ago. His smoking use included cigarettes. He has never used smokeless tobacco. He reports current alcohol use of about 1.0 standard drink of alcohol per week. He reports that he does not use drugs.   Family History:  Father had high blood pressure and diabetes. Mother had no cardiac problems. No other pertinent family history.   Review of systems complete and found to be negative unless listed in HPI.    BP 124/80   Pulse 85   Wt 82.7 kg (182 lb 6.4 oz)   SpO2 98%   BMI 25.44 kg/m   Wt Readings from Last 3 Encounters:  05/31/22 82.7 kg (182 lb 6.4 oz)  02/26/22 73.4 kg (161 lb 12.8 oz)  02/23/22 83 kg (183 lb)    Physical Exam: General:  Well appearing. No resp difficulty HEENT: normal Neck: supple. Jvp to jaw Carotids 2+ bilat; no bruits. No lymphadenopathy or thryomegaly appreciated. Cor: PMI nondisplaced. Regular rate & rhythm. Lungs: + basilar crackles  Abdomen: soft, nontender, + distended. No hepatosplenomegaly. No bruits or masses. Good bowel sounds. Extremities: no cyanosis, clubbing, rash, trace edema Neuro: alert & orientedx3, cranial nerves grossly intact. moves all 4 extremities w/o difficulty. Affect pleasant   ReDs: 40%  ASSESSMENT AND PLAN: 1.  Chronic Systolic HF:  - Due to NICM. Suspect HTN in nature. EF has been down since 2015.  - LHC (2015) no CAD. - CPX (7/18) with suboptimal effort.  - Echo (6/19): LVEF 20-25%, Grade 1 DD.  - Echo (5/22): EF 20-25%, RV ok - Echo today 05/31/22 EF < 20% RV moderately down Severe MR. Mod-severe TR IVC markedly dilated Personally reviewed - He is worse today in setting of missing diuretics x 2 months. Unclear if he is taking other meds correctly but he says he is.  - Markedly volume overloaded ReDS 49% Resume torsemide 40 daily  - Take metolazone 2.5 today and tomorrow with 40 KCL with each dose then  resume metolazone 2.5/K 40 every Friday.  - Continue spironolactone 12.5 mg daily. - Continue Jardiance 10 mg daily. - Continue Entresto 97-103 mg bid.  - Continue Coreg 3.125 mg bid. He has not tolerated higher doses. - Pt intolerant to cMRI with claustrophobia.  - Continues to refuse ICD. - Likely will require advanced therapies in future but he has been resistant and likely not candidate with noncompliance - Labs today  - See back next week - Total time spent 45 minutes. Over half that time spent discussing above.   2. HTN  - Stable.  3. Noncompliance - Stress medication compliance numerous times - Asked him to bring meds with him next week.  - Consider  referral back to paramedicine   Total time spent 45 minutes. Over half that time spent discussing above.    Glori Bickers, MD  05/31/22 3:21 PM

## 2022-05-31 NOTE — Progress Notes (Signed)
ReDS Vest / Clip - 05/31/22 1500       ReDS Vest / Clip   Station Marker D    Ruler Value 29    ReDS Value Range High volume overload    ReDS Actual Value 49

## 2022-05-31 NOTE — Patient Instructions (Addendum)
Tome torasemida 60 mg (3 tabletas) al da.  Tome 1 tableta de [place] hoy 6/29 y maana 6/30. Despus de que tomarlo todos los viernes. Tome 2 comprimidos de potasio adicionales con su metolazona.  Laboratorios realizados hoy, sus resultados estarn disponibles en MyChart, nos pondremos en contacto con usted para lecturas anormales.  Su mdico recomienda que programe una cita de seguimiento en: 1 semana.   Si tiene [place] pregunta o inquietud antes de su prxima cita, envenos un mensaje a travs de mychart o llame a nuestra oficina al [phone].    PARA DEJAR UN MENSAJE PARA LA ENFERMERA SELECCIONE LA OPCIN 2, POR FAVOR DEJE UN MENSAJE QUE INCLUYA: TE LLAMAS FECHA DE NACIMIENTO  NMERO DE DEVOLUCIN DE LLAMADA  MOTIVO DE LA LLAMADA**esto es importante ya que priorizamos las devoluciones de llamada   Asegrese de traer todos sus frascos de medicamentos a cada cita.

## 2022-06-06 ENCOUNTER — Telehealth (HOSPITAL_COMMUNITY): Payer: Self-pay

## 2022-06-06 NOTE — Telephone Encounter (Signed)
Called and left patient a message to confirm/remind patient of their appointment at the [facility] on 06/07/22.   Patient reminded to bring all medications and/or complete list.

## 2022-06-06 NOTE — Progress Notes (Incomplete)
Advanced Heart Failure Clinic Note    Date:  06/07/2022   ID:  RASHEED WELTY, DOB 08-03-1956, MRN [id number]  PCP:  Arma Heading, MD  Cardiologist:  Dr. Johnsie Cancel HF Cardiologist: Dr. [name]   HPI: KEIDRICK MURTY is a 66 y.o. male with NICM, HTN,  and chronic systolic heart failure diagnosed in 2015.    Admitted 5/22 for A/CHF. Diuresed with IV lasix. Repeat echo EF 20-25%. Discharged on po lasix prn.   Saw Dr [name] 06/21/21 and was volume overloaded. He was switched from lasix to torsemide 40 daily and instructed to take metolazone 2.5 mg for 2 days.   Returned for HF follow up 8/22 and was taking lasix and torsemide daily and had stopped his spiro. He was referred to paramedicine to help with medication. Unable to start Paramedicine because he said he had to many appointments.   Saw Dr [name] 10/2021 and doing fine. NYHA II.   Evaluated in the ED 01/23/22 for increased dyspnea. Had [place] 01/23/22 and has not felt good since that time. Given IV lasix and started on Legevrio for COVID. Discharged to home.   Evaluated in the ED 02/20/22 for abdominal bloating. Started on GI cocktail. Discharged to home later that day.   Follow up 02/22/22 volume overloaded, given 80 mg IV lasix + 40 KCL, ReDs 53%. Remained volume overloaded at follow up next day, ReDs 57% (? Accuracy), weight down 2 lbs. Given another dose of IV Lasix 80 mg + metolazone 2.5 mg x 1 + extra 40 KCL and increased daily torsemide to 80 bid starting the following day.  Echo 6/23 EF < 20%, RV moderately down, severe MR, mod-severe TR, IVC markedly dilated. Weight up 20 lbs, had been off torsemide x 2 months. Resumed torsemide 40 mg daily, instructed to take metolazone 2.5 mg/40 KCL x 2 days, then weekly thereafter.  Today he returns for HF follow up with his wife. Overall feeling fine. He says he has no SOB with activity. Denies palpitations, CP, dizziness, edema, or PND/Orthopnea. Appetite ok. No fever or chills. Weight  at home 172 pounds. Taking all medications. Drinks a lot of water during the day.  Cardiac studies: - Echo (8/15): EF 25%  - Echo (7/17): EF 20-25%  - Echo (6/18): EF 20-25% Grade II DD - Echo (6/19): EF 20-25%, Grade I DD - Echo (5/22): EF 20-25%, RV ok. - Echo (6/23): EF < 20%, RV moderately down, severe MR, mod-severe TR.  - Cath by Dr Martinique 2015 with no CAD  Unable to tolerate cMRI due to claustrophobia.   - CPX Test (7/18): Peak VO2: 20.6 (74% predicted peak VO2) VE/VCO2 slope:  28 OUES: 2.55 Peak RER: 0.96 Submax test. Mildly reduced functional capacity. Mild chrontropic incompetence in setting of submax effort. No obvious HF or pulmonary limitation noted.   Review of systems complete and found to be negative unless listed in HPI.   Past Medical History:  Diagnosis Date   CHF (congestive heart failure) ([place]) 07/05/2014   Hypertension Dx 2015   Past Surgical History:  Procedure Laterality Date   LEFT HEART CATHETERIZATION WITH CORONARY ANGIOGRAM N/A 07/05/2014   Procedure: LEFT HEART CATHETERIZATION WITH CORONARY ANGIOGRAM;  Surgeon: Peter M Martinique, MD;  Location: [facility] CATH LAB;  Service: Cardiovascular;  Laterality: N/A;   Current Outpatient Medications  Medication Sig Dispense Refill   aspirin EC 81 MG tablet Take 1 tablet (81 mg total) by mouth daily. 90 tablet 3  carvedilol (COREG) 3.125 MG tablet Take 1 tablet (3.125 mg total) by mouth 2 (two) times daily. 60 tablet 3   metolazone (ZAROXOLYN) 2.5 MG tablet Take 1 tablet (2.5 mg total) by mouth once a week. 1 tablet every Friday with extra 40 meq of Potassium 4 tablet 5   Multiple Vitamins-Minerals (CENTRUM MEN) TABS Take 1 tablet by mouth daily.     potassium chloride SA (KLOR-CON M) 20 MEQ tablet Take 2 tablets (40 mEq total) by mouth every morning AND 1 tablet (20 mEq total) every evening. Take an extra 2 tablets when taking Metolazone. 120 tablet 3   sacubitril-valsartan (ENTRESTO) 97-103 MG Take 1 tablet by mouth 2  (two) times daily. 60 tablet 11   spironolactone (ALDACTONE) 25 MG tablet Take 0.5 tablets (12.5 mg total) by mouth daily. 45 tablet 3   torsemide (DEMADEX) 20 MG tablet Take 3 tablets (60 mg total) by mouth daily. 240 tablet 3   No current facility-administered medications for this encounter.   Allergies:   Ace inhibitors   Social History:  The patient  reports that he quit smoking about 45 years ago. His smoking use included cigarettes. He has never used smokeless tobacco. He reports current alcohol use of about 1.0 standard drink of alcohol per week. He reports that he does not use drugs.   Family History:  Father had high blood pressure and diabetes. Mother had no cardiac problems. No other pertinent family history.   Review of systems complete and found to be negative unless listed in HPI.    BP 110/78   Pulse 82   Wt 78.3 kg (172 lb 9.6 oz)   SpO2 97%   BMI 24.07 kg/m   Wt Readings from Last 3 Encounters:  06/07/22 78.3 kg (172 lb 9.6 oz)  05/31/22 82.7 kg (182 lb 6.4 oz)  02/26/22 73.4 kg (161 lb 12.8 oz)   Physical Exam: General:  NAD. No resp difficulty, walked into clinic HEENT: Normal Neck: Supple. JVP 7-8. Carotids 2+ bilat; no bruits. No lymphadenopathy or thryomegaly appreciated. Cor: PMI nondisplaced. Regular rate & rhythm. No rubs, gallops, 2/6 SEM LLSB. Lungs: Clear Abdomen: Soft, nontender, nondistended. No hepatosplenomegaly. No bruits or masses. Good bowel sounds. Extremities: No cyanosis, clubbing, rash, edema Neuro: Alert & oriented x 3, cranial nerves grossly intact. Moves all 4 extremities w/o difficulty. Affect pleasant.  ReDs: 49%  Assessment & Plan: 1.  Chronic Systolic HF:  - Due to NICM. Suspect HTN in nature. EF has been down since 2015.  - LHC (2015) no CAD. - CPX (7/18) with suboptimal effort.  - Echo (6/19): LVEF 20-25%, Grade 1 DD.  - Echo (5/22): EF 20-25%, RV ok - Echo (6/23): EF < 20% RV moderately down Severe MR. Mod-severe TR IVC  markedly dilated  - Improved NYHA II, volume better today, weight down 10 lbs, likely has 10 lbs more to go, REDs 49% - Take metolazone 2.5 mg + extra 40 KCL today and tomorrow (Continue metolazone 2.5 + 40 KCL every Friday thereafter). - Continue torsemide 40 mg daily. - Continue spironolactone 12.5 mg daily. - Continue Jardiance 10 mg daily. - Continue Entresto 97-103 mg bid.  - Continue Coreg 3.125 mg bid. He has not tolerated higher doses. - Pt intolerant to cMRI with claustrophobia.  - Continues to refuse ICD. - Likely will require advanced therapies in future but he has been resistant and likely not candidate with noncompliance - Labs today, repeat BMET in 10-14 days.  2. HTN  -  Stable.  3. Noncompliance - Stress medication compliance numerous times. - Asked him to bring meds with him next week.  - Consider referral back to paramedicine, he was discharged 10/22.  Follow up in 4 weeks with APP and 3-4 months with Dr. [name].  [place], FNP  06/07/22 1:45 PM

## 2022-06-07 ENCOUNTER — Encounter (HOSPITAL_COMMUNITY): Payer: Self-pay

## 2022-06-07 ENCOUNTER — Ambulatory Visit (HOSPITAL_COMMUNITY)
Admission: RE | Admit: 2022-06-07 | Discharge: 2022-06-07 | Disposition: A | Discharge status: Home / Self Care | Payer: Medicare Other | Source: Ambulatory Visit | Attending: Family Medicine | Admitting: Family Medicine

## 2022-06-07 VITALS — BP 110/78 | HR 82 | Wt 172.6 lb

## 2022-06-07 DIAGNOSIS — Z91148 Patient's other noncompliance with medication regimen for other reason: Secondary | ICD-10-CM

## 2022-06-07 DIAGNOSIS — I428 Other cardiomyopathies: Secondary | ICD-10-CM | POA: Insufficient documentation

## 2022-06-07 DIAGNOSIS — Z7984 Long term (current) use of oral hypoglycemic drugs: Secondary | ICD-10-CM | POA: Insufficient documentation

## 2022-06-07 DIAGNOSIS — I110 Hypertensive heart disease with heart failure: Secondary | ICD-10-CM | POA: Diagnosis present

## 2022-06-07 DIAGNOSIS — I5022 Chronic systolic (congestive) heart failure: Secondary | ICD-10-CM

## 2022-06-07 DIAGNOSIS — Z91199 Patient's noncompliance with other medical treatment and regimen due to unspecified reason: Secondary | ICD-10-CM | POA: Insufficient documentation

## 2022-06-07 DIAGNOSIS — F40240 Claustrophobia: Secondary | ICD-10-CM | POA: Diagnosis not present

## 2022-06-07 DIAGNOSIS — I10 Essential (primary) hypertension: Secondary | ICD-10-CM

## 2022-06-07 DIAGNOSIS — Z79899 Other long term (current) drug therapy: Secondary | ICD-10-CM | POA: Insufficient documentation

## 2022-06-07 LAB — BASIC METABOLIC PANEL
Anion gap: 9 (ref 5–15)
BUN: 20 mg/dL (ref 8–23)
CO2: 26 mmol/L (ref 22–32)
Calcium: 9.6 mg/dL (ref 8.9–10.3)
Chloride: 107 mmol/L (ref 98–111)
Creatinine, Ser: 1.43 mg/dL — ABNORMAL HIGH (ref 0.61–1.24)
GFR, Estimated: 55 mL/min — ABNORMAL LOW (ref >60)
Glucose, Bld: 98 mg/dL (ref 70–99)
Potassium: 4.4 mmol/L (ref 3.5–5.1)
Sodium: 142 mmol/L (ref 135–145)

## 2022-06-07 LAB — BRAIN NATRIURETIC PEPTIDE: B Natriuretic Peptide: 1656.9 pg/mL — ABNORMAL HIGH (ref 0.0–100.0)

## 2022-06-07 NOTE — Progress Notes (Signed)
ReDS Vest / Clip - 06/07/22 1300       ReDS Vest / Clip   Station Marker C    Ruler Value 26    ReDS Value Range High volume overload    ReDS Actual Value 49

## 2022-06-07 NOTE — Patient Instructions (Addendum)
Please take Metolazone 2.5 mg today and tomorrow with additional 40 meq of potassium  Labs today We will only contact you if something comes back abnormal or we need to make some changes. Otherwise no news is good news!  Labs needed in 2 weeks  Your physician recommends that you schedule a follow-up appointment in: 4 weeks  in the Advanced Practitioners (PA/NP) Clinic and in 3-4 months with Dr [name]. You will receive a reminder letter in the mail two months in advance. If you don't receive a letter, please call our office to schedule the follow-up appointment.  Do the following things EVERYDAY: Weigh yourself in the morning before breakfast. Write it down and keep it in a log. Take your medicines as prescribed Eat low salt foods--Limit salt (sodium) to 2000 mg per day.  Stay as active as you can everyday Limit all fluids for the day to less than 2 liters  At the [facility], you and your health needs are our priority. As part of our continuing mission to provide you with exceptional heart care, we have created designated Provider Care Teams. These Care Teams include your primary Cardiologist (physician) and Advanced Practice Providers (APPs- Physician Assistants and Nurse Practitioners) who all work together to provide you with the care you need, when you need it.   You may see any of the following providers on your designated Care Team at your next follow up: Dr Glori Bickers Dr Haynes Kerns, NP Lyda Jester, [place] [facility] [place], [place] Audry Riles, PharmD   Please be sure to bring in all your medications bottles to every appointment.

## 2022-06-21 ENCOUNTER — Other Ambulatory Visit (HOSPITAL_COMMUNITY): Payer: Medicare Other

## 2022-06-25 ENCOUNTER — Other Ambulatory Visit: Payer: Self-pay

## 2022-07-05 ENCOUNTER — Telehealth (HOSPITAL_COMMUNITY): Payer: Self-pay

## 2022-07-05 NOTE — Telephone Encounter (Signed)
Called to confirm/remind patient of their appointment at the Advanced Heart Failure Clinic on 07/06/22.   Patient reminded to bring all medications and/or complete list.  Confirmed patient has transportation. Gave directions, instructed to utilize valet parking.  Confirmed appointment prior to ending call.   

## 2022-07-06 ENCOUNTER — Inpatient Hospital Stay (HOSPITAL_COMMUNITY)
Admission: RE | Admit: 2022-07-06 | Discharge: 2022-07-06 | Disposition: A | Discharge status: Home / Self Care | Payer: Medicare Other | Source: Ambulatory Visit

## 2022-07-31 ENCOUNTER — Telehealth (HOSPITAL_COMMUNITY): Payer: Self-pay

## 2022-07-31 NOTE — Telephone Encounter (Signed)
Called and left patient a voice message to confirm/remind patient of their appointment at the Advanced [facility] Failure Clinic on 08/01/22.       

## 2022-08-01 ENCOUNTER — Ambulatory Visit (HOSPITAL_COMMUNITY)
Admission: RE | Admit: 2022-08-01 | Discharge: 2022-08-01 | Disposition: A | Discharge status: Home / Self Care | Payer: Medicare Other | Source: Ambulatory Visit | Attending: Family Medicine | Admitting: Family Medicine

## 2022-08-01 ENCOUNTER — Other Ambulatory Visit: Payer: Self-pay

## 2022-08-01 ENCOUNTER — Encounter (HOSPITAL_COMMUNITY): Payer: Self-pay

## 2022-08-01 VITALS — BP 114/90 | HR 88 | Ht 71.0 in | Wt 176.4 lb

## 2022-08-01 DIAGNOSIS — I5023 Acute on chronic systolic (congestive) heart failure: Secondary | ICD-10-CM | POA: Diagnosis not present

## 2022-08-01 DIAGNOSIS — I5022 Chronic systolic (congestive) heart failure: Secondary | ICD-10-CM | POA: Insufficient documentation

## 2022-08-01 DIAGNOSIS — R194 Change in bowel habit: Secondary | ICD-10-CM | POA: Diagnosis not present

## 2022-08-01 DIAGNOSIS — I110 Hypertensive heart disease with heart failure: Secondary | ICD-10-CM | POA: Diagnosis present

## 2022-08-01 DIAGNOSIS — I428 Other cardiomyopathies: Secondary | ICD-10-CM | POA: Diagnosis not present

## 2022-08-01 DIAGNOSIS — R109 Unspecified abdominal pain: Secondary | ICD-10-CM | POA: Insufficient documentation

## 2022-08-01 DIAGNOSIS — F40240 Claustrophobia: Secondary | ICD-10-CM | POA: Insufficient documentation

## 2022-08-01 DIAGNOSIS — K429 Umbilical hernia without obstruction or gangrene: Secondary | ICD-10-CM | POA: Insufficient documentation

## 2022-08-01 DIAGNOSIS — Z91199 Patient's noncompliance with other medical treatment and regimen due to unspecified reason: Secondary | ICD-10-CM | POA: Insufficient documentation

## 2022-08-01 DIAGNOSIS — I10 Essential (primary) hypertension: Secondary | ICD-10-CM

## 2022-08-01 DIAGNOSIS — Z7984 Long term (current) use of oral hypoglycemic drugs: Secondary | ICD-10-CM | POA: Insufficient documentation

## 2022-08-01 DIAGNOSIS — Z79899 Other long term (current) drug therapy: Secondary | ICD-10-CM | POA: Insufficient documentation

## 2022-08-01 LAB — BASIC METABOLIC PANEL
Anion gap: 8 (ref 5–15)
BUN: 26 mg/dL — ABNORMAL HIGH (ref 8–23)
CO2: 29 mmol/L (ref 22–32)
Calcium: 9.2 mg/dL (ref 8.9–10.3)
Chloride: 106 mmol/L (ref 98–111)
Creatinine, Ser: 1.74 mg/dL — ABNORMAL HIGH (ref 0.61–1.24)
GFR, Estimated: 43 mL/min — ABNORMAL LOW (ref >60)
Glucose, Bld: 108 mg/dL — ABNORMAL HIGH (ref 70–99)
Potassium: 4.3 mmol/L (ref 3.5–5.1)
Sodium: 143 mmol/L (ref 135–145)

## 2022-08-01 LAB — BRAIN NATRIURETIC PEPTIDE: B Natriuretic Peptide: 4249.6 pg/mL — ABNORMAL HIGH (ref 0.0–100.0)

## 2022-08-01 NOTE — Progress Notes (Signed)
ReDS Vest / Clip - 08/01/22 1517       ReDS Vest / Clip   Station Marker C    Ruler Value 27.5    ReDS Value Range High volume overload    ReDS Actual Value 49

## 2022-08-01 NOTE — Patient Instructions (Addendum)
Take Metolazone 2.5 mg and extra 40 meq (2 tablets) of potassium today (one time only). Then return to regular dosing of Friday Metolazone and potassium.  Return to see Nurse Practitioner in 4 weeks. Return to see Dr. [name] in 3 months.

## 2022-08-01 NOTE — Progress Notes (Signed)
Advanced Heart Failure Clinic Note    Date:  08/01/2022   ID:  Alpha, Mysliwiec 1956-04-05, MRN [id number]  PCP:  Arma Heading, MD  Cardiologist:  Dr. Johnsie Cancel HF Cardiologist: Dr. [name]   HPI: Joel Beck is a 66 y.o. male with NICM, HTN,  and chronic systolic heart failure diagnosed in 2015.    Admitted 5/22 for A/CHF. Diuresed with IV lasix. Repeat echo EF 20-25%. Discharged on po lasix prn.   Saw Dr [name] 06/21/21 and was volume overloaded. He was switched from lasix to torsemide 40 daily and instructed to take metolazone 2.5 mg for 2 days.   Returned for HF follow up 8/22 and was taking lasix and torsemide daily and had stopped his spiro. He was referred to paramedicine to help with medication. Unable to start Paramedicine because he said he had to many appointments.   Saw Dr [name] 10/2021 and doing fine. NYHA II.   Evaluated in the ED 01/23/22 for increased dyspnea. Had [place] 01/23/22 and has not felt good since that time. Given IV lasix and started on Legevrio for COVID. Discharged to home.   Evaluated in the ED 02/20/22 for abdominal bloating. Started on GI cocktail. Discharged to home later that day.   Follow up 02/22/22 volume overloaded, given 80 mg IV lasix + 40 KCL, ReDs 53%. Remained volume overloaded at follow up next day, ReDs 57% (? Accuracy), weight down 2 lbs. Given another dose of IV Lasix 80 mg + metolazone 2.5 mg x 1 + extra 40 KCL and increased daily torsemide to 80 bid starting the following day.  Echo 6/23 EF < 20%, RV moderately down, severe MR, mod-severe TR, IVC markedly dilated. Weight up 20 lbs, had been off torsemide x 2 months. Resumed torsemide 40 mg daily, instructed to take metolazone 2.5 mg/40 KCL x 2 days, then weekly thereafter.  Today he returns for HF follow up with his wife and interpretor. Overall feeling poorly. He is tired and nauseated. Main issue is belly pain. He is not constipated. He associates his abdominal pain with  heparin injection he received inpatient last year. He says he is not short of breath with ADLs. Denies palpitations, CP, dizziness, edema, or PND/Orthopnea. Appetite ok. No fever or chills. Weight at home 176-177 pounds. Taking all medications. Has been dismissed from paramedicine. Drinks a lot of fluid during the day.   Cardiac studies: - Echo (8/15): EF 25%  - Echo (7/17): EF 20-25%  - Echo (6/18): EF 20-25% Grade II DD - Echo (6/19): EF 20-25%, Grade I DD - Echo (5/22): EF 20-25%, RV ok. - Echo (6/23): EF < 20%, RV moderately down, severe MR, mod-severe TR.  - Cath by Dr Martinique 2015 with no CAD  Unable to tolerate cMRI due to claustrophobia.   - CPX Test (7/18): Peak VO2: 20.6 (74% predicted peak VO2) VE/VCO2 slope:  28 OUES: 2.55 Peak RER: 0.96 Submax test. Mildly reduced functional capacity. Mild chrontropic incompetence in setting of submax effort. No obvious HF or pulmonary limitation noted.   Review of systems complete and found to be negative unless listed in HPI.   Past Medical History:  Diagnosis Date   CHF (congestive heart failure) ([place]) 07/05/2014   Hypertension Dx 2015   Past Surgical History:  Procedure Laterality Date   LEFT HEART CATHETERIZATION WITH CORONARY ANGIOGRAM N/A 07/05/2014   Procedure: LEFT HEART CATHETERIZATION WITH CORONARY ANGIOGRAM;  Surgeon: Peter M Martinique, MD;  Location: [facility] CATH LAB;  Service: Cardiovascular;  Laterality: N/A;   Current Outpatient Medications  Medication Sig Dispense Refill   aspirin EC 81 MG tablet Take 1 tablet (81 mg total) by mouth daily. 90 tablet 3   carvedilol (COREG) 3.125 MG tablet Take 1 tablet (3.125 mg total) by mouth 2 (two) times daily. 60 tablet 3   potassium chloride SA (KLOR-CON M) 20 MEQ tablet Take 2 tablets (40 mEq total) by mouth every morning AND 1 tablet (20 mEq total) every evening. Take an extra 2 tablets when taking Metolazone. 120 tablet 3   sacubitril-valsartan (ENTRESTO) 97-103 MG Take 1 tablet by  mouth 2 (two) times daily. 60 tablet 11   spironolactone (ALDACTONE) 25 MG tablet Take 0.5 tablets (12.5 mg total) by mouth daily. 45 tablet 3   torsemide (DEMADEX) 20 MG tablet Take 3 tablets (60 mg total) by mouth daily. (Patient taking differently: Take 40 mg by mouth daily.) 240 tablet 3   metolazone (ZAROXOLYN) 2.5 MG tablet Take 1 tablet (2.5 mg total) by mouth once a week. 1 tablet every Friday with extra 40 meq of Potassium 4 tablet 5   Multiple Vitamins-Minerals (CENTRUM MEN) TABS Take 1 tablet by mouth daily.     No current facility-administered medications for this encounter.   Allergies:   Ace inhibitors   Social History:  The patient  reports that he quit smoking about 45 years ago. His smoking use included cigarettes. He has never used smokeless tobacco. He reports current alcohol use of about 1.0 standard drink of alcohol per week. He reports that he does not use drugs.   Family History:  Father had high blood pressure and diabetes. Mother had no cardiac problems. No other pertinent family history.   Review of systems complete and found to be negative unless listed in HPI.    BP (!) 114/90   Pulse 88   Ht 5\' 11"  (1.803 m)   Wt 80 kg (176 lb 6.4 oz)   SpO2 100%   BMI 24.60 kg/m   Wt Readings from Last 3 Encounters:  08/01/22 80 kg (176 lb 6.4 oz)  06/07/22 78.3 kg (172 lb 9.6 oz)  05/31/22 82.7 kg (182 lb 6.4 oz)   Physical Exam: General:  NAD. No resp difficulty HEENT: Normal Neck: Supple. No JVD. Carotids 2+ bilat; no bruits. No lymphadenopathy or thryomegaly appreciated. Cor: PMI nondisplaced. Regular rate & rhythm. No rubs, gallops, 2/6 SEM LLSB Lungs: Clear Abdomen: Soft, nontender, nondistended. No hepatosplenomegaly. No bruits or masses. Good bowel sounds. + umbilical hernia Extremities: No cyanosis, clubbing, rash, trace pedal edema Neuro: Alert & oriented x 3, cranial nerves grossly intact. Moves all 4 extremities w/o difficulty. Affect pleasant.  ReDs:  49%  Assessment & Plan: 1.  Chronic Systolic HF:  - Due to NICM. Suspect HTN in nature. EF has been down since 2015.  - LHC (2015) no CAD. - CPX (7/18) with suboptimal effort.  - Echo (6/19): LVEF 20-25%, Grade 1 DD.  - Echo (5/22): EF 20-25%, RV ok - Echo (6/23): EF < 20% RV moderately down Severe MR. Mod-severe TR IVC markedly dilated  - NYHA II, volume up, REDs 49%. ? Medication compliance and indiscretion with fluid. - Take metolazone 2.5 mg + extra 40 KCL today and then continue weekly metolazone 2.5 + 40 KCL every Friday. - Continue torsemide 40 mg daily. - Continue spironolactone 12.5 mg daily. - Continue Jardiance 10 mg daily. - Continue Entresto 97-103 mg bid.  - Continue Coreg 3.125 mg bid.  He has not tolerated higher doses. - Pt intolerant to cMRI with claustrophobia.  - Continues to refuse ICD. - Likely will require advanced therapies in future but he has been resistant and likely not candidate with noncompliance - BMET and BNP - Consider Furoscix if volume refractory to escalation to diuretics.  2. HTN  - Stable.  3. Abdominal pain - likely multifactorial. - No changes in bowel habits. - Diurese as above. - He has an umbilical hernia, has PCP follow up.  4. Noncompliance - Stress medication compliance numerous times. - Asked him to bring meds with him next visit. - Consider referral back to paramedicine, he was discharged 10/22.  Follow up in 4 weeks with APP and 3-4 months with Dr. [name].  [place], FNP  08/01/22 3:33 PM

## 2022-08-10 NOTE — Progress Notes (Signed)
Appt details made

## 2022-08-14 ENCOUNTER — Other Ambulatory Visit: Payer: Self-pay

## 2022-08-15 ENCOUNTER — Encounter (HOSPITAL_COMMUNITY): Payer: Medicare Other

## 2022-08-28 ENCOUNTER — Telehealth (HOSPITAL_COMMUNITY): Payer: Self-pay

## 2022-08-28 NOTE — Telephone Encounter (Signed)
Called and left patient a message to confirm/remind patient of their appointment at the [facility] on 08/29/22.   A message was also left to bring all medications and/or complete list and to give our office a call back if needed and the number and option was provided.

## 2022-08-29 ENCOUNTER — Encounter (HOSPITAL_COMMUNITY): Payer: Medicare Other

## 2022-08-30 ENCOUNTER — Encounter (HOSPITAL_COMMUNITY): Payer: Medicare Other

## 2022-09-04 ENCOUNTER — Other Ambulatory Visit: Payer: Self-pay

## 2022-09-04 ENCOUNTER — Other Ambulatory Visit: Payer: Self-pay | Admitting: Emergency Medicine

## 2022-09-04 ENCOUNTER — Other Ambulatory Visit (HOSPITAL_COMMUNITY): Payer: Self-pay | Admitting: Internal Medicine

## 2022-09-04 MED ORDER — CARVEDILOL 3.125 MG PO TABS
3.1250 mg | ORAL_TABLET | Freq: Two times a day (BID) | ORAL | 3 refills | Status: DC
  Filled 2022-09-04: qty 180, 90d supply, fill #0

## 2022-10-01 ENCOUNTER — Other Ambulatory Visit: Payer: Self-pay

## 2022-10-04 ENCOUNTER — Ambulatory Visit: Payer: Medicare Other | Admitting: Emergency Medicine

## 2022-10-05 ENCOUNTER — Other Ambulatory Visit: Payer: Self-pay

## 2022-10-16 ENCOUNTER — Emergency Department (HOSPITAL_COMMUNITY): Payer: Medicare Other

## 2022-10-16 ENCOUNTER — Emergency Department (HOSPITAL_COMMUNITY)
Admission: EM | Admit: 2022-10-16 | Discharge: 2022-10-16 | Disposition: A | Discharge status: Home / Self Care | Payer: Medicare Other | Attending: Emergency Medicine | Admitting: Emergency Medicine

## 2022-10-16 ENCOUNTER — Encounter (HOSPITAL_COMMUNITY): Payer: Self-pay

## 2022-10-16 DIAGNOSIS — K429 Umbilical hernia without obstruction or gangrene: Secondary | ICD-10-CM | POA: Diagnosis not present

## 2022-10-16 DIAGNOSIS — Z7982 Long term (current) use of aspirin: Secondary | ICD-10-CM | POA: Insufficient documentation

## 2022-10-16 DIAGNOSIS — R1084 Generalized abdominal pain: Secondary | ICD-10-CM | POA: Diagnosis present

## 2022-10-16 LAB — COMPREHENSIVE METABOLIC PANEL
ALT: 29 U/L (ref 0–44)
AST: 27 U/L (ref 15–41)
Albumin: 3.5 g/dL (ref 3.5–5.0)
Alkaline Phosphatase: 92 U/L (ref 38–126)
Anion gap: 6 (ref 5–15)
BUN: 23 mg/dL (ref 8–23)
CO2: 29 mmol/L (ref 22–32)
Calcium: 9 mg/dL (ref 8.9–10.3)
Chloride: 108 mmol/L (ref 98–111)
Creatinine, Ser: 1.43 mg/dL — ABNORMAL HIGH (ref 0.61–1.24)
GFR, Estimated: 54 mL/min — ABNORMAL LOW (ref >60)
Glucose, Bld: 107 mg/dL — ABNORMAL HIGH (ref 70–99)
Potassium: 3.9 mmol/L (ref 3.5–5.1)
Sodium: 143 mmol/L (ref 135–145)
Total Bilirubin: 1 mg/dL (ref 0.3–1.2)
Total Protein: 6.4 g/dL — ABNORMAL LOW (ref 6.5–8.1)

## 2022-10-16 LAB — CBC WITH DIFFERENTIAL/PLATELET
Abs Immature Granulocytes: 0.02 10*3/uL (ref 0.00–0.07)
Basophils Absolute: 0 10*3/uL (ref 0.0–0.1)
Basophils Relative: 0 %
Eosinophils Absolute: 0.3 10*3/uL (ref 0.0–0.5)
Eosinophils Relative: 4 %
HCT: 44.5 % (ref 39.0–52.0)
Hemoglobin: 14.4 g/dL (ref 13.0–17.0)
Immature Granulocytes: 0 %
Lymphocytes Relative: 11 %
Lymphs Abs: 0.7 10*3/uL (ref 0.7–4.0)
MCH: 31.9 pg (ref 26.0–34.0)
MCHC: 32.4 g/dL (ref 30.0–36.0)
MCV: 98.5 fL (ref 80.0–100.0)
Monocytes Absolute: 0.5 10*3/uL (ref 0.1–1.0)
Monocytes Relative: 8 %
Neutro Abs: 4.7 10*3/uL (ref 1.7–7.7)
Neutrophils Relative %: 77 %
Platelets: 149 10*3/uL — ABNORMAL LOW (ref 150–400)
RBC: 4.52 MIL/uL (ref 4.22–5.81)
RDW: 14 % (ref 11.5–15.5)
WBC: 6.1 10*3/uL (ref 4.0–10.5)
nRBC: 0 % (ref 0.0–0.2)

## 2022-10-16 LAB — LIPASE, BLOOD: Lipase: 44 U/L (ref 11–51)

## 2022-10-16 NOTE — ED Provider Notes (Signed)
[facility] DEPT Provider Note   CSN: [id number] Arrival date & time: 10/16/22  0753     History  Chief Complaint  Patient presents with   Abdominal Pain    Joel Beck is a 66 y.o. male.  66 year old male with prior medical history as detailed below presents for evaluation.  Patient is complaining of frequent intermittent diffuse abdominal pain.  This is been ongoing for at least a year.  Patient reports that his pain was somewhat worse this week.  Patient reports that his pain is mostly around his umbilicus.  Pain is worse with heavy lifting and or with vigorous intercourse with his spouse.  Patient denies nausea or vomiting.  He denies change in bowel movements.  He denies any urinary symptoms.  He denies fever.  The history is provided by medical records and the patient. A language interpreter was used.  Abdominal Pain      Home Medications Prior to Admission medications   Medication Sig Start Date End Date Taking? Authorizing Provider  aspirin EC 81 MG tablet Take 1 tablet (81 mg total) by mouth daily. 03/15/16   Funches, Adriana Mccallum, MD  carvedilol (COREG) 3.125 MG tablet Take 1 tablet (3.125 mg total) by mouth 2 (two) times daily. 09/04/22   Milford, Maricela Bo, FNP  metolazone (ZAROXOLYN) 2.5 MG tablet Take 1 tablet (2.5 mg total) by mouth once a week. 1 tablet every Friday with extra 40 meq of Potassium 02/26/22 06/28/22  Milford, Maricela Bo, FNP  Multiple Vitamins-Minerals (CENTRUM MEN) TABS Take 1 tablet by mouth daily.    [provider]  potassium chloride SA (KLOR-CON M) 20 MEQ tablet Take 2 tablets (40 mEq total) by mouth every morning AND 1 tablet (20 mEq total) every evening. Take an extra 2 tablets when taking Metolazone. 03/08/22   Milford, Maricela Bo, FNP  sacubitril-valsartan (ENTRESTO) 97-103 MG Take 1 tablet by mouth 2 (two) times daily. 05/14/22   Rafael Bihari, FNP  spironolactone (ALDACTONE) 25 MG tablet Take 0.5 tablets  (12.5 mg total) by mouth daily. 07/25/21 02/23/23  Rafael Bihari, FNP  torsemide (DEMADEX) 20 MG tablet Take 3 tablets (60 mg total) by mouth daily. Patient taking differently: Take 40 mg by mouth daily. 05/31/22   Bensimhon, Shaune Pascal, MD      Allergies    Ace inhibitors    Review of Systems   Review of Systems  Gastrointestinal:  Positive for abdominal pain.  All other systems reviewed and are negative.   Physical Exam Updated Vital Signs BP (!) 125/103 (BP Location: Right Arm)   Pulse 85   Temp 97.6 F (36.4 C) (Oral)   Resp 16   Ht 5\' 11"  (1.803 m)   Wt 74.8 kg   SpO2 100%   BMI 23.01 kg/m  Physical Exam Vitals and nursing note reviewed.  Constitutional:      General: He is not in acute distress.    Appearance: Normal appearance. He is well-developed.  HENT:     Head: Normocephalic and atraumatic.  Eyes:     Conjunctiva/sclera: Conjunctivae normal.     Pupils: Pupils are equal, round, and reactive to light.  Cardiovascular:     Rate and Rhythm: Normal rate and regular rhythm.     Heart sounds: Normal heart sounds.  Pulmonary:     Effort: Pulmonary effort is normal. No respiratory distress.     Breath sounds: Normal breath sounds.  Abdominal:     General: There is  no distension.     Palpations: Abdomen is soft.     Tenderness: There is no abdominal tenderness.     Comments: Mall easily reducible umbilical hernia is present.  No indication of incarceration or obstruction on exam  Musculoskeletal:        General: No deformity. Normal range of motion.     Cervical back: Normal range of motion and neck supple.  Skin:    General: Skin is warm and dry.  Neurological:     General: No focal deficit present.     Mental Status: He is alert and oriented to person, place, and time.     ED Results / Procedures / Treatments   Labs (all labs ordered are listed, but only abnormal results are displayed) Labs Reviewed  CBC WITH DIFFERENTIAL/PLATELET - Abnormal; Notable  for the following components:      Result Value   Platelets 149 (*)    All other components within normal limits  COMPREHENSIVE METABOLIC PANEL - Abnormal; Notable for the following components:   Glucose, Bld 107 (*)    Creatinine, Ser 1.43 (*)    Total Protein 6.4 (*)    GFR, Estimated 54 (*)    All other components within normal limits  LIPASE, BLOOD  URINALYSIS, ROUTINE W REFLEX MICROSCOPIC    EKG None  Radiology CT ABDOMEN PELVIS WO CONTRAST  Result Date: 10/16/2022 CLINICAL DATA:  66 year old male with acute abdominal and pelvic pain. EXAM: CT ABDOMEN AND PELVIS WITHOUT CONTRAST TECHNIQUE: Multidetector CT imaging of the abdomen and pelvis was performed following the standard protocol without IV contrast. RADIATION DOSE REDUCTION: This exam was performed according to the departmental dose-optimization program which includes automated exposure control, adjustment of the mA and/or kV according to patient size and/or use of iterative reconstruction technique. COMPARISON:  02/20/2022 and 05/27/2021 CTs FINDINGS: Please note that parenchymal and vascular abnormalities may be missed as intravenous contrast was not administered. Lower chest: No acute abnormalities noted. Moderate cardiomegaly and mild LEFT basilar atelectasis/scarring again noted. Hepatobiliary: The liver is unchanged with a 1 cm RIGHT hepatic cyst. No new hepatic abnormalities are identified. Gallbladder wall thickening is again noted likely related to ascites or hepatic dysfunction. There is no evidence of intrahepatic or extrahepatic biliary dilatation. Pancreas: Unremarkable Spleen: Unremarkable Adrenals/Urinary Tract: The kidneys, adrenal glands and bladder are unremarkable. No urinary calculi or hydronephrosis identified. Stomach/Bowel: Stomach is within normal limits. Appendix appears normal. No evidence of bowel wall thickening, distention, or inflammatory changes. Vascular/Lymphatic: Aortic atherosclerosis. No enlarged  abdominal or pelvic lymph nodes. Reproductive: Prostate is unremarkable. Other: A very small amount of ascites is noted within the abdomen and pelvis. A small paraumbilical hernia containing fat is again noted. There is no evidence of pneumoperitoneum. Musculoskeletal: No acute or suspicious bony abnormalities are noted. IMPRESSION: 1. Very small amount of ascites within the abdomen and pelvis. 2. Unchanged gallbladder wall thickening, likely related to ascites or hepatic dysfunction. 3. Unchanged moderate cardiomegaly and mild LEFT basilar atelectasis/scarring. 4. Unchanged small paraumbilical hernia containing fat. 5.  Aortic Atherosclerosis (ICD10-I70.0). Electronically Signed   By: Margarette Canada M.D.   On: 10/16/2022 09:27    Procedures Procedures    Medications Ordered in ED Medications - No data to display  ED Course/ Medical Decision Making/ A&P                           Medical Decision Making Amount and/or Complexity of Data Reviewed Radiology:  ordered.    Medical Screen Complete  This patient presented to the ED with complaint of abdominal pain.  This complaint involves an extensive number of treatment options. The initial differential diagnosis includes, but is not limited to, local hernia, intra-abdominal pathology, metabolic abnormality, etc.  This presentation is: Acute, Chronic, Self-Limited, Previously Undiagnosed, Uncertain Prognosis, Complicated, and Systemic Symptoms  Patient is presenting with complaint of chronic intermittent abdominal pain.  Patient's pain appears to be centered around his umbilical hernia.  Patient's reported pain seems to be worse with Valsalva maneuvers.  Exam is not suggestive of umbilical hernia incarceration or obstruction.  Labs and CT imaging obtained is without evidence of significant acute pathology.  Patient is advised that follow-up with surgery for further evaluation of his umbilical hernia and possible need for repair is  appropriate.  Importance of close follow-up is stressed.  Strict return precautions given and understood.  Additional history obtained:  External records from outside sources obtained and reviewed including prior ED visits and prior Inpatient records.    Lab Tests:  I ordered and personally interpreted labs.  The pertinent results include: CBC, CMP, lipase   Imaging Studies ordered:  I ordered imaging studies including CT abdomen pelvis I independently visualized and interpreted obtained imaging which showed no acute pathology I agree with the radiologist interpretation. Problem List / ED Course:  Umbilical hernia   Reevaluation:  After the interventions noted above, I reevaluated the patient and found that they have: stayed the same   Disposition:  After consideration of the diagnostic results and the patients response to treatment, I feel that the patent would benefit from close outpatient follow-up.          Final Clinical Impression(s) / ED Diagnoses Final diagnoses:  Umbilical hernia without obstruction and without gangrene    Rx / DC Orders ED Discharge Orders     None         Valarie Merino, MD 10/16/22 1040

## 2022-10-16 NOTE — Discharge Instructions (Signed)
  Return for any problem.  You have a small umbilical hernia.  You should follow-up with both your regular care provider and also with surgery.  Surgery may be indicated for correction of your umbilical hernia.  This is something that can be discussed with the general surgeons at [facility].  Please call and make an appointment.

## 2022-10-16 NOTE — ED Triage Notes (Signed)
Pt arrived via POV, c/o abd pain x1 year. Pain worsening this week. Denies any n/v or diarrhea, denies any urinary  sx.

## 2022-10-26 ENCOUNTER — Emergency Department (HOSPITAL_COMMUNITY)
Admission: EM | Admit: 2022-10-26 | Discharge: 2022-10-26 | Disposition: A | Discharge status: Home / Self Care | Payer: Medicare Other | Attending: Emergency Medicine | Admitting: Emergency Medicine

## 2022-10-26 ENCOUNTER — Other Ambulatory Visit: Payer: Self-pay

## 2022-10-26 ENCOUNTER — Emergency Department (HOSPITAL_COMMUNITY): Payer: Medicare Other

## 2022-10-26 DIAGNOSIS — N189 Chronic kidney disease, unspecified: Secondary | ICD-10-CM | POA: Insufficient documentation

## 2022-10-26 DIAGNOSIS — Z79899 Other long term (current) drug therapy: Secondary | ICD-10-CM | POA: Insufficient documentation

## 2022-10-26 DIAGNOSIS — I5023 Acute on chronic systolic (congestive) heart failure: Secondary | ICD-10-CM | POA: Diagnosis not present

## 2022-10-26 DIAGNOSIS — Z7982 Long term (current) use of aspirin: Secondary | ICD-10-CM | POA: Diagnosis not present

## 2022-10-26 DIAGNOSIS — R0602 Shortness of breath: Secondary | ICD-10-CM | POA: Diagnosis present

## 2022-10-26 DIAGNOSIS — I1310 Hypertensive heart and chronic kidney disease without heart failure, with stage 1 through stage 4 chronic kidney disease, or unspecified chronic kidney disease: Secondary | ICD-10-CM | POA: Diagnosis not present

## 2022-10-26 DIAGNOSIS — Z1152 Encounter for screening for COVID-19: Secondary | ICD-10-CM | POA: Diagnosis not present

## 2022-10-26 LAB — CBC
HCT: 41.7 % (ref 39.0–52.0)
Hemoglobin: 13.9 g/dL (ref 13.0–17.0)
MCH: 32.2 pg (ref 26.0–34.0)
MCHC: 33.3 g/dL (ref 30.0–36.0)
MCV: 96.5 fL (ref 80.0–100.0)
Platelets: 124 10*3/uL — ABNORMAL LOW (ref 150–400)
RBC: 4.32 MIL/uL (ref 4.22–5.81)
RDW: 14.1 % (ref 11.5–15.5)
WBC: 5.4 10*3/uL (ref 4.0–10.5)
nRBC: 0 % (ref 0.0–0.2)

## 2022-10-26 LAB — BASIC METABOLIC PANEL
Anion gap: 9 (ref 5–15)
BUN: 30 mg/dL — ABNORMAL HIGH (ref 8–23)
CO2: 25 mmol/L (ref 22–32)
Calcium: 8.8 mg/dL — ABNORMAL LOW (ref 8.9–10.3)
Chloride: 107 mmol/L (ref 98–111)
Creatinine, Ser: 1.84 mg/dL — ABNORMAL HIGH (ref 0.61–1.24)
GFR, Estimated: 40 mL/min — ABNORMAL LOW (ref >60)
Glucose, Bld: 148 mg/dL — ABNORMAL HIGH (ref 70–99)
Potassium: 3.8 mmol/L (ref 3.5–5.1)
Sodium: 141 mmol/L (ref 135–145)

## 2022-10-26 LAB — BRAIN NATRIURETIC PEPTIDE: B Natriuretic Peptide: >4500 pg/mL — ABNORMAL HIGH (ref 0.0–100.0)

## 2022-10-26 LAB — TROPONIN I (HIGH SENSITIVITY)
Troponin I (High Sensitivity): 38 ng/L — ABNORMAL HIGH (ref <18)
Troponin I (High Sensitivity): 41 ng/L — ABNORMAL HIGH (ref <18)

## 2022-10-26 LAB — RESP PANEL BY RT-PCR (FLU A&B, COVID) ARPGX2
Influenza A by PCR: NEGATIVE
Influenza B by PCR: NEGATIVE
SARS Coronavirus 2 by RT PCR: NEGATIVE

## 2022-10-26 MED ORDER — FUROSEMIDE 10 MG/ML IJ SOLN: 60.0000 mg | Freq: Once | INTRAMUSCULAR | Status: DC

## 2022-10-26 MED ORDER — POTASSIUM CHLORIDE CRYS ER 20 MEQ PO TBCR: 40.0000 meq | EXTENDED_RELEASE_TABLET | Freq: Two times a day (BID) | ORAL | Status: DC

## 2022-10-26 MED ORDER — FUROSEMIDE 10 MG/ML IJ SOLN
80.0000 mg | Freq: Once | INTRAMUSCULAR | Status: AC
  Administered 2022-10-26: 80 mg via INTRAVENOUS
  Filled 2022-10-26: qty 8

## 2022-10-26 MED ORDER — POTASSIUM CHLORIDE CRYS ER 20 MEQ PO TBCR
40.0000 meq | EXTENDED_RELEASE_TABLET | Freq: Once | ORAL | Status: AC
  Administered 2022-10-26: 40 meq via ORAL
  Filled 2022-10-26: qty 2

## 2022-10-26 NOTE — ED Provider Notes (Signed)
[facility] EMERGENCY DEPARTMENT Provider Note   CSN: [id number] Arrival date & time: 10/26/22  1539     History  Chief Complaint  Patient presents with   Shortness of Breath    Joel Beck is a 66 y.o. male with chronic systolic heart failure (YS06%), hypertension, CKD, GERD, onychomycosis presents with SOB.  Patient p/w SOB x 6 days, denies f/c, CP, or cough. His symptoms are worst at night and have been preventing him from sleeping. He doesn't feel that short of breath until he lies down at night. He also endorses increased bilateral symmetric LEE.  He has had this happen before when he needs IV lasix. He states he takes a diuretic.    Shortness of Breath      Home Medications Prior to Admission medications   Medication Sig Start Date End Date Taking? Authorizing Provider  aspirin EC 81 MG tablet Take 1 tablet (81 mg total) by mouth daily. 03/15/16   Funches, Adriana Mccallum, MD  carvedilol (COREG) 3.125 MG tablet Take 1 tablet (3.125 mg total) by mouth 2 (two) times daily. 09/04/22   Milford, Maricela Bo, FNP  metolazone (ZAROXOLYN) 2.5 MG tablet Take 1 tablet (2.5 mg total) by mouth once a week. 1 tablet every Friday with extra 40 meq of Potassium 02/26/22 06/28/22  Milford, Maricela Bo, FNP  Multiple Vitamins-Minerals (CENTRUM MEN) TABS Take 1 tablet by mouth daily.    [provider]  potassium chloride SA (KLOR-CON M) 20 MEQ tablet Take 2 tablets (40 mEq total) by mouth every morning AND 1 tablet (20 mEq total) every evening. Take an extra 2 tablets when taking Metolazone. 03/08/22   Milford, Maricela Bo, FNP  sacubitril-valsartan (ENTRESTO) 97-103 MG Take 1 tablet by mouth 2 (two) times daily. 05/14/22   Rafael Bihari, FNP  spironolactone (ALDACTONE) 25 MG tablet Take 0.5 tablets (12.5 mg total) by mouth daily. 07/25/21 02/23/23  Rafael Bihari, FNP  torsemide (DEMADEX) 20 MG tablet Take 3 tablets (60 mg total) by mouth daily. Patient taking differently:  Take 40 mg by mouth daily. 05/31/22   Bensimhon, Shaune Pascal, MD      Allergies    Ace inhibitors    Review of Systems   Review of Systems  Respiratory:  Positive for shortness of breath.    Review of systems positive for orthopnea.  A 10 point review of systems was performed and is negative unless otherwise reported in HPI.  Physical Exam Updated Vital Signs BP 111/88 (BP Location: Left Arm)   Pulse 76   Temp (!) 97.5 F (36.4 C) (Oral)   Resp 20   SpO2 99%  Physical Exam General: Normal appearing male, lying in bed.  HEENT: PERRLA, Sclera anicteric, MMM, trachea midline. Cardiology: RRR, no murmurs/rubs/gallops. BL radial and DP pulses equal bilaterally.  Resp: Normal respiratory rate and effort. CTAB, no wheezes, rhonchi, crackles.  Abd: Soft, non-tender, non-distended. No rebound tenderness or guarding.  GU: Deferred. MSK: Mild pitting edema bilaterally, symmetric. No signs of trauma. Extremities without deformity or TTP. No cyanosis or clubbing. Skin: warm, dry. No rashes or lesions. Neuro: A&Ox4, CNs II-XII grossly intact. MAEs. Sensation grossly intact.  Psych: Normal mood and affect.   ED Results / Procedures / Treatments   Labs (all labs ordered are listed, but only abnormal results are displayed) Labs Reviewed  BASIC METABOLIC PANEL - Abnormal; Notable for the following components:      Result Value   Glucose, Bld 148 (*)  BUN 30 (*)    Creatinine, Ser 1.84 (*)    Calcium 8.8 (*)    GFR, Estimated 40 (*)    All other components within normal limits  CBC - Abnormal; Notable for the following components:   Platelets 124 (*)    All other components within normal limits  BRAIN NATRIURETIC PEPTIDE - Abnormal; Notable for the following components:   B Natriuretic Peptide >4,500.0 (*)    All other components within normal limits  TROPONIN I (HIGH SENSITIVITY) - Abnormal; Notable for the following components:   Troponin I (High Sensitivity) 38 (*)    All other  components within normal limits  RESP PANEL BY RT-PCR (FLU A&B, COVID) [id number]  TROPONIN I (HIGH SENSITIVITY)    EKG EKG Interpretation  Date/Time:  Friday October 26 2022 15:38:25 EST Ventricular Rate:  79 PR Interval:  184 QRS Duration: 112 QT Interval:  394 QTC Calculation: 451 R Axis:   106 Text Interpretation: Sinus rhythm with occasional Premature ventricular complexes Possible Left atrial enlargement Rightward axis Biventricular hypertrophy with repolarization abnormality Abnormal ECG When compared with ECG of 22-Feb-2022 14:55, PREVIOUS ECG IS PRESENT since last tracing no significant change Confirmed by Malvin Johns [phone]) on 10/26/2022 3:49:54 PM  Radiology No results found.  Procedures Procedures    Medications Ordered in ED Medications - No data to display  ED Course/ Medical Decision Making/ A&P                          Medical Decision Making Amount and/or Complexity of Data Reviewed Labs:  Decision-making details documented in ED Course.  Risk Prescription drug management.    This patient presents to the ED for concern of orthopnea, this involves an extensive number of treatment options, and is a complaint that carries with it a high risk of complications and morbidity.  I considered the following differential and admission for this acute, potentially life threatening condition.   MDM:    Overall patient is HDS and well-appearing though slightly tachypneic. He has normal breath sounds on auscultation and is not requiring any oxygen, no increased WOB or resp distress.   For this patient's orthopnea and increased LEE, greatest concern for HF exacerbation, which patient has had before on chart review w/ cardiology. Also consider pleural effusion, ACS/arrhythmia. No cough or fever to suggest PNA. No wheezing or h/o COPD. Last echo 05/31/22 shows EF <20% w/ severe MR and mod-severe TR. Consider valvular heart disease as well could be contributing to his  symptoms. Will obtain labs and CXR. Patient already has demonstrated desire to leave the hospital and not be admitted.   Clinical Course as of 11/07/22 1342  Fri Oct 26, 2022  1848 B Natriuretic Peptide(!): >4,500.0 [HN]  1848 Troponin I (High Sensitivity)(!): 38 [HN]  2124 D/w patient who states he would like to be discharged home.  Per chart review, patient's proBNP is often >4,500 and his troponin is flat, has been in the 30s on previous test as well.  He currently has no chest pain and states that he would like to receive the medication that helps him and be discharged home.  Per chart review, patient has received 80 mg IV Lasix +40 mill equivalents KCl at previous cardiology appointments which she states has helped him and will do the same for him here today.  Patient is instructed to call his cardiologist on Monday morning to make a follow-up appointment.  Given discharge instructions and  return precautions.  All questions answered to patient and his wife satisfaction. [HN]    Clinical Course User Index [HN] Audley Hose, MD    Labs: I Ordered, and personally interpreted labs.  The pertinent results include:  BNP >4,500, which it has been multiple times in the past. Trop 38-41 also c/w prior values. No leukocytosis or anemia.   Imaging Studies ordered: I ordered imaging studies including CXR I independently visualized and interpreted imaging. I agree with the radiologist interpretation  Additional history obtained from patient's wife at bedside.    Cardiac Monitoring: The patient was maintained on a cardiac monitor.  I personally viewed and interpreted the cardiac monitored which showed an underlying rhythm of: NSR  Social Determinants of Health: Lives independently with his wife. [place] an immigrant [place]   Disposition:  DC w/ discharge instrucitons/return precautions  Co morbidities that complicate the patient evaluation  Past Medical History:  Diagnosis  Date   CHF (congestive heart failure) ([place]) 07/05/2014   Hypertension Dx 2015     Medicines Meds ordered this encounter  Medications   DISCONTD: furosemide (LASIX) injection 60 mg   furosemide (LASIX) injection 80 mg   DISCONTD: potassium chloride SA (KLOR-CON M) CR tablet 40 mEq   potassium chloride SA (KLOR-CON M) CR tablet 40 mEq    I have reviewed the patients home medicines and have made adjustments as needed  Problem List / ED Course: Problem List Items Addressed This Visit   None Visit Diagnoses     Acute on chronic systolic heart failure (HCC)    -  Primary   Relevant Medications   furosemide (LASIX) injection 80 mg (Completed)          This note was created using dictation software, which may contain spelling or grammatical errors.    Audley Hose, MD 11/07/22 [phone]

## 2022-10-26 NOTE — ED Triage Notes (Signed)
Pt presents with shob x 6 days. Pt denies  CP or cough.  No fever/chills, n/v/d

## 2022-10-26 NOTE — Discharge Instructions (Addendum)
Gracias por venir al [facility]. Lo atendieron por dificultad para respirar e hinchazn de las piernas. Hicimos un examen, laboratorios e imgenes, y Gerrit Heck exacerbacin de la insuficiencia cardaca. Le ofrecieron la admisin pero prefiri volver a casa. Lo trataron en el servicio de urgencias con Lasix y un suplemento de potasio. Tome todos sus medicamentos en casa segn lo prescrito. Sherilyn Cooter un seguimiento con su cardilogo dentro de 1 semana. Llmelos lo antes posible para programar una cita.  No dude en regresar al servicio de urgencias o llamar al 911 si experimenta: -Empeoramiento de los sntomas -Dolor en el pecho, dificultad para respirar. -Aturdimiento, [place] otra cosa que te preocupe

## 2022-10-26 NOTE — ED Provider Triage Note (Signed)
Emergency Medicine Provider Triage Evaluation Note  Joel Beck , a 66 y.o. male  was evaluated in triage.  Pt complains of worsening shortness of breath over the past several days.  Patient has a history of congestive heart failure.  He is on a diuretic.  States that his symptoms are worse in the evening and with lying flat.  He has also had worsening lower extremity swelling.  No fevers or cough.  Review of Systems  Positive: Shortness of breath, leg swelling Negative: Fever  Physical Exam  BP (!) 126/98 (BP Location: Right Arm)   Pulse 86   Temp (!) 97.5 F (36.4 C) (Oral)   Resp 20   SpO2 99%  Gen:   Awake, no distress   Resp:  Normal effort, lungs clear to auscultation MSK:   Moves extremities without difficulty  Other:  2+, symmetric lower extremity edema  Medical Decision Making  Medically screening exam initiated at 4:06 PM.  Appropriate orders placed.  RAKWON LETOURNEAU was informed that the remainder of the evaluation will be completed by another provider, this initial triage assessment does not replace that evaluation, and the importance of remaining in the ED until their evaluation is complete.     Carlisle Cater, PA-C 10/26/22 1607

## 2022-10-30 ENCOUNTER — Other Ambulatory Visit: Payer: Self-pay | Admitting: Surgery

## 2022-10-30 ENCOUNTER — Other Ambulatory Visit: Payer: Self-pay

## 2022-11-07 ENCOUNTER — Encounter (HOSPITAL_COMMUNITY): Payer: Self-pay | Admitting: Internal Medicine

## 2022-11-07 ENCOUNTER — Other Ambulatory Visit: Payer: Self-pay

## 2022-11-07 ENCOUNTER — Ambulatory Visit (HOSPITAL_COMMUNITY)
Admission: RE | Admit: 2022-11-07 | Discharge: 2022-11-07 | Disposition: A | Discharge status: Home / Self Care | Payer: Medicare Other | Source: Ambulatory Visit | Attending: Internal Medicine | Admitting: Internal Medicine

## 2022-11-07 VITALS — BP 104/70 | HR 88 | Wt 188.4 lb

## 2022-11-07 DIAGNOSIS — Z7984 Long term (current) use of oral hypoglycemic drugs: Secondary | ICD-10-CM | POA: Diagnosis not present

## 2022-11-07 DIAGNOSIS — I428 Other cardiomyopathies: Secondary | ICD-10-CM | POA: Insufficient documentation

## 2022-11-07 DIAGNOSIS — R109 Unspecified abdominal pain: Secondary | ICD-10-CM | POA: Insufficient documentation

## 2022-11-07 DIAGNOSIS — I130 Hypertensive heart and chronic kidney disease with heart failure and stage 1 through stage 4 chronic kidney disease, or unspecified chronic kidney disease: Secondary | ICD-10-CM | POA: Diagnosis present

## 2022-11-07 DIAGNOSIS — Z91199 Patient's noncompliance with other medical treatment and regimen due to unspecified reason: Secondary | ICD-10-CM | POA: Diagnosis not present

## 2022-11-07 DIAGNOSIS — F40240 Claustrophobia: Secondary | ICD-10-CM | POA: Insufficient documentation

## 2022-11-07 DIAGNOSIS — I5023 Acute on chronic systolic (congestive) heart failure: Secondary | ICD-10-CM | POA: Diagnosis not present

## 2022-11-07 DIAGNOSIS — N184 Chronic kidney disease, stage 4 (severe): Secondary | ICD-10-CM | POA: Diagnosis not present

## 2022-11-07 DIAGNOSIS — I5022 Chronic systolic (congestive) heart failure: Secondary | ICD-10-CM | POA: Diagnosis not present

## 2022-11-07 DIAGNOSIS — K429 Umbilical hernia without obstruction or gangrene: Secondary | ICD-10-CM | POA: Insufficient documentation

## 2022-11-07 DIAGNOSIS — Z79899 Other long term (current) drug therapy: Secondary | ICD-10-CM | POA: Diagnosis not present

## 2022-11-07 LAB — BASIC METABOLIC PANEL
Anion gap: 6 (ref 5–15)
BUN: 26 mg/dL — ABNORMAL HIGH (ref 8–23)
CO2: 28 mmol/L (ref 22–32)
Calcium: 9.1 mg/dL (ref 8.9–10.3)
Chloride: 107 mmol/L (ref 98–111)
Creatinine, Ser: 1.58 mg/dL — ABNORMAL HIGH (ref 0.61–1.24)
GFR, Estimated: 48 mL/min — ABNORMAL LOW (ref >60)
Glucose, Bld: 101 mg/dL — ABNORMAL HIGH (ref 70–99)
Potassium: 3.9 mmol/L (ref 3.5–5.1)
Sodium: 141 mmol/L (ref 135–145)

## 2022-11-07 LAB — BRAIN NATRIURETIC PEPTIDE: B Natriuretic Peptide: 3731.5 pg/mL — ABNORMAL HIGH (ref 0.0–100.0)

## 2022-11-07 MED ORDER — SPIRONOLACTONE 25 MG PO TABS
12.5000 mg | ORAL_TABLET | Freq: Every day | ORAL | 3 refills | Status: DC
  Filled 2022-11-07: qty 45, 90d supply, fill #0

## 2022-11-07 MED ORDER — METOLAZONE 5 MG PO TABS
5.0000 mg | ORAL_TABLET | Freq: Every day | ORAL | 0 refills | Status: DC
  Filled 2022-11-07: qty 2, 2d supply, fill #0

## 2022-11-07 NOTE — Progress Notes (Signed)
ReDS Vest / Clip - 11/07/22 1400       ReDS Vest / Clip   Station Marker C    Ruler Value 29    ReDS Value Range High volume overload    ReDS Actual Value 50

## 2022-11-07 NOTE — Patient Instructions (Signed)
START Metolazone 5 mg one tab daily for 2 days with an additional 40 meq of potassium START Spironolactone 12.5 mg one half tab daily  STOP Carvedilol  Labs today We will only contact you if something comes back abnormal or we need to make some changes. Otherwise no news is good news!  Your physician recommends that you schedule a follow-up appointment in: 2 days with Dr [name]  Do the following things EVERYDAY: Weigh yourself in the morning before breakfast. Write it down and keep it in a log. Take your medicines as prescribed Eat low salt foods--Limit salt (sodium) to 2000 mg per day.  Stay as active as you can everyday Limit all fluids for the day to less than 2 liters  At the [facility], you and your health needs are our priority. As part of our continuing mission to provide you with exceptional heart care, we have created designated Provider Care Teams. These Care Teams include your primary Cardiologist (physician) and Advanced Practice Providers (APPs- Physician Assistants and Nurse Practitioners) who all work together to provide you with the care you need, when you need it.   You may see any of the following providers on your designated Care Team at your next follow up: Dr Glori Bickers Dr Loralie Champagne Dr. Roxana Hires, NP Lyda Jester, [place] [facility] [facility], [place] Forestine Na, NP Audry Riles, PharmD   Please be sure to bring in all your medications bottles to every appointment.   If you have any questions or concerns before your next appointment please send [place] a message through [place] or call our office at [phone].    TO LEAVE A MESSAGE FOR THE NURSE SELECT OPTION 2, PLEASE LEAVE A MESSAGE INCLUDING: YOUR NAME DATE OF BIRTH CALL BACK NUMBER REASON FOR CALL**this is important as we prioritize the call backs  YOU WILL RECEIVE A CALL BACK THE SAME DAY AS LONG AS YOU CALL BEFORE 4:00 PM

## 2022-11-07 NOTE — Progress Notes (Addendum)
Advanced Heart Failure Clinic Note    Date:  11/07/2022   ID:  Joel Beck, DOB [birth year], MRN [id number]  PCP:  Arma Heading, MD (Inactive)  Cardiologist:  Dr. Johnsie Cancel HF Cardiologist: Dr. [name]   HPI: Joel Beck is a 66 y.o. male with NICM, HTN,  and chronic systolic heart failure diagnosed in 2015.    Admitted 5/22 for A/CHF. Diuresed with IV lasix. Repeat echo EF 20-25%. Discharged on po lasix prn.   Saw Dr [name] 06/21/21 and was volume overloaded. He was switched from lasix to torsemide 40 daily and instructed to take metolazone 2.5 mg for 2 days.   Returned for HF follow up 8/22 and was taking lasix and torsemide daily and had stopped his spiro. He was referred to paramedicine to help with medication. Unable to start Paramedicine because he said he had to many appointments.   Saw Dr [name] 10/2021 and doing fine. NYHA II.   Evaluated in the ED 01/23/22 for increased dyspnea. Had [place] 01/23/22 and has not felt good since that time. Given IV lasix and started on Legevrio for COVID. Discharged to home.   Evaluated in the ED 02/20/22 for abdominal bloating. Started on GI cocktail. Discharged to home later that day.   Follow up 02/22/22 volume overloaded, given 80 mg IV lasix + 40 KCL, ReDs 53%. Remained volume overloaded at follow up next day, ReDs 57% (? Accuracy), weight down 2 lbs. Given another dose of IV Lasix 80 mg + metolazone 2.5 mg x 1 + extra 40 KCL and increased daily torsemide to 80 bid starting the following day.  Echo 6/23 EF < 20%, RV moderately down, severe MR, mod-severe TR, IVC markedly dilated. Weight up 20 lbs, had been off torsemide x 2 months. Resumed torsemide 40 mg daily, instructed to take metolazone 2.5 mg/40 KCL x 2 days, then weekly thereafter.  Today he returns for HF follow up with his wife and interpretor. Says he doesn't feel well. Main complaint is abdominal bloating and discomfort. Says he can't walk or sleep due to discomfort.  Having a lot of diarrhea. + LE edema. + Orthopnea. Has not been taking metolazone or spiro.    ReDS 50%   Cardiac studies: - Echo (8/15): EF 25%  - Echo (7/17): EF 20-25%  - Echo (6/18): EF 20-25% Grade II DD - Echo (6/19): EF 20-25%, Grade I DD - Echo (5/22): EF 20-25%, RV ok. - Echo (6/23): EF < 20%, RV moderately down, severe MR, mod-severe TR.  - Cath by Dr Martinique 2015 with no CAD  Unable to tolerate cMRI due to claustrophobia.   - CPX Test (7/18): Peak VO2: 20.6 (74% predicted peak VO2) VE/VCO2 slope:  28 OUES: 2.55 Peak RER: 0.96 Submax test. Mildly reduced functional capacity. Mild chrontropic incompetence in setting of submax effort. No obvious HF or pulmonary limitation noted.   Review of systems complete and found to be negative unless listed in HPI.   Past Medical History:  Diagnosis Date   CHF (congestive heart failure) ([place]) 07/05/2014   Hypertension Dx 2015   Past Surgical History:  Procedure Laterality Date   LEFT HEART CATHETERIZATION WITH CORONARY ANGIOGRAM N/A 07/05/2014   Procedure: LEFT HEART CATHETERIZATION WITH CORONARY ANGIOGRAM;  Surgeon: Peter M Martinique, MD;  Location: [facility] CATH LAB;  Service: Cardiovascular;  Laterality: N/A;   Current Outpatient Medications  Medication Sig Dispense Refill   aspirin EC 81 MG tablet Take 1 tablet (81 mg total) by  mouth daily. 90 tablet 3   carvedilol (COREG) 3.125 MG tablet Take 1 tablet (3.125 mg total) by mouth 2 (two) times daily. 180 tablet 3   Multiple Vitamins-Minerals (CENTRUM MEN) TABS Take 1 tablet by mouth daily.     potassium chloride SA (KLOR-CON M) 20 MEQ tablet Take 2 tablets (40 mEq total) by mouth every morning AND 1 tablet (20 mEq total) every evening. Take an extra 2 tablets when taking Metolazone. 120 tablet 3   sacubitril-valsartan (ENTRESTO) 97-103 MG Take 1 tablet by mouth 2 (two) times daily. 60 tablet 11   torsemide (DEMADEX) 20 MG tablet Take 3 tablets (60 mg total) by mouth daily. 240 tablet 3    metolazone (ZAROXOLYN) 2.5 MG tablet Take 1 tablet (2.5 mg total) by mouth once a week. 1 tablet every Friday with extra 40 meq of Potassium (Patient not taking: Reported on 11/07/2022) 4 tablet 5   spironolactone (ALDACTONE) 25 MG tablet Take 0.5 tablets (12.5 mg total) by mouth daily. (Patient not taking: Reported on 11/07/2022) 45 tablet 3   No current facility-administered medications for this encounter.   Allergies:   Ace inhibitors   Social History:  The patient  reports that he quit smoking about 45 years ago. His smoking use included cigarettes. He has never used smokeless tobacco. He reports current alcohol use of about 1.0 standard drink of alcohol per week. He reports that he does not use drugs.   Family History:  Father had high blood pressure and diabetes. Mother had no cardiac problems. No other pertinent family history.   Review of systems complete and found to be negative unless listed in HPI.    BP 104/70   Pulse 88   Wt 85.5 kg (188 lb 6.4 oz)   SpO2 100%   BMI 26.28 kg/m   Wt Readings from Last 3 Encounters:  11/07/22 85.5 kg (188 lb 6.4 oz)  10/16/22 74.8 kg (165 lb)  08/01/22 80 kg (176 lb 6.4 oz)   Physical Exam: General:  Weak appearing. No resp difficulty HEENT: normal Neck: supple. JVP to ear  Carotids 2+ bilat; no bruits. No lymphadenopathy or thryomegaly appreciated. Cor: PMI nondisplaced. Regular rate & rhythm. 3/6 MR Lungs: clear Abdomen: soft, nontender, + distended. No hepatosplenomegaly. No bruits or masses. Good bowel sounds. Extremities: no cyanosis, clubbing, rash, 3+ edema Neuro: alert & orientedx3, cranial nerves grossly intact. moves all 4 extremities w/o difficulty. Affect pleasant   Assessment & Plan: 1.  Acute on chronic Systolic HF:  - Due to NICM. Suspect HTN in nature. EF has been down since 2015.  - LHC (2015) no CAD. - CPX (7/18) with suboptimal effort.  - Echo (6/19): LVEF 20-25%, Grade 1 DD.  - Echo (5/22): EF 20-25%, RV ok -  Echo (6/23): EF < 20% RV moderately down Severe MR. Mod-severe TR IVC markedly dilated  - NYHA IIIB-IV. I am worried he is nearing end-stage - Markedly volume overloaded. Has not been taking weekly metolazone or spiro  - Will give metolazone 5mg  daily x 2 days  (with Kcl 40 daily) and restart spiro 12.5 - Continue torsemide 40 mg daily. - Restart spironolactone 12.5 mg daily. - Continue Jardiance 10 mg daily. - Continue Entresto 97-103 mg bid.  - Hold carvedilol for now with potential low output - Pt intolerant to cMRI with claustrophobia.  - nearing end-stage. Will see back in 2 days. If not improved will admit.  - He is not [place] citizen so does not qualify  for advanced therapies due to inability to get insurance coverage  2. HTN  - BP ok  3. Abdominal pain - likely multifactorial. Concern for low output - He has an umbilical hernia, has PCP follow up.  4. CKD 4 - Scr up to 1.8 - Recheck today  5. Noncompliance - Stress medication compliance    Total time spent 45 minutes. Over half that time spent discussing above.   Glori Bickers, MD  11/07/22 2:15 PM

## 2022-11-09 ENCOUNTER — Other Ambulatory Visit: Payer: Self-pay

## 2022-11-09 ENCOUNTER — Encounter (HOSPITAL_COMMUNITY): Payer: Self-pay | Admitting: Internal Medicine

## 2022-11-09 ENCOUNTER — Telehealth (HOSPITAL_COMMUNITY): Payer: Self-pay

## 2022-11-09 ENCOUNTER — Ambulatory Visit (HOSPITAL_COMMUNITY)
Admission: RE | Admit: 2022-11-09 | Discharge: 2022-11-09 | Disposition: A | Discharge status: Home / Self Care | Payer: Medicare Other | Source: Ambulatory Visit | Attending: Internal Medicine | Admitting: Internal Medicine

## 2022-11-09 VITALS — BP 110/78 | HR 74 | Wt 175.2 lb

## 2022-11-09 DIAGNOSIS — I130 Hypertensive heart and chronic kidney disease with heart failure and stage 1 through stage 4 chronic kidney disease, or unspecified chronic kidney disease: Secondary | ICD-10-CM | POA: Insufficient documentation

## 2022-11-09 DIAGNOSIS — Z79899 Other long term (current) drug therapy: Secondary | ICD-10-CM | POA: Diagnosis not present

## 2022-11-09 DIAGNOSIS — F40240 Claustrophobia: Secondary | ICD-10-CM | POA: Insufficient documentation

## 2022-11-09 DIAGNOSIS — I428 Other cardiomyopathies: Secondary | ICD-10-CM | POA: Diagnosis not present

## 2022-11-09 DIAGNOSIS — Z7984 Long term (current) use of oral hypoglycemic drugs: Secondary | ICD-10-CM | POA: Insufficient documentation

## 2022-11-09 DIAGNOSIS — I5022 Chronic systolic (congestive) heart failure: Secondary | ICD-10-CM

## 2022-11-09 DIAGNOSIS — I5023 Acute on chronic systolic (congestive) heart failure: Secondary | ICD-10-CM | POA: Insufficient documentation

## 2022-11-09 DIAGNOSIS — R109 Unspecified abdominal pain: Secondary | ICD-10-CM

## 2022-11-09 DIAGNOSIS — N183 Chronic kidney disease, stage 3 unspecified: Secondary | ICD-10-CM

## 2022-11-09 DIAGNOSIS — Z91199 Patient's noncompliance with other medical treatment and regimen due to unspecified reason: Secondary | ICD-10-CM | POA: Insufficient documentation

## 2022-11-09 DIAGNOSIS — N184 Chronic kidney disease, stage 4 (severe): Secondary | ICD-10-CM | POA: Insufficient documentation

## 2022-11-09 DIAGNOSIS — K429 Umbilical hernia without obstruction or gangrene: Secondary | ICD-10-CM | POA: Insufficient documentation

## 2022-11-09 LAB — BASIC METABOLIC PANEL
Anion gap: 8 (ref 5–15)
BUN: 22 mg/dL (ref 8–23)
CO2: 33 mmol/L — ABNORMAL HIGH (ref 22–32)
Calcium: 9.7 mg/dL (ref 8.9–10.3)
Chloride: 98 mmol/L (ref 98–111)
Creatinine, Ser: 1.49 mg/dL — ABNORMAL HIGH (ref 0.61–1.24)
GFR, Estimated: 52 mL/min — ABNORMAL LOW (ref >60)
Glucose, Bld: 90 mg/dL (ref 70–99)
Potassium: 3.8 mmol/L (ref 3.5–5.1)
Sodium: 139 mmol/L (ref 135–145)

## 2022-11-09 LAB — BRAIN NATRIURETIC PEPTIDE: B Natriuretic Peptide: 2199.2 pg/mL — ABNORMAL HIGH (ref 0.0–100.0)

## 2022-11-09 MED ORDER — METOLAZONE 5 MG PO TABS
5.0000 mg | ORAL_TABLET | ORAL | 1 refills | Status: DC
  Filled 2022-11-09: qty 12, 84d supply, fill #0

## 2022-11-09 NOTE — Telephone Encounter (Signed)
Advanced Heart Failure Patient Advocate Encounter  This patient may be eligible for a grant that would cover the cost of Entresto. Attempted to reach patient via interpreter call, no answer. Did not leave voicemail at this time, will try back.  Clista Bernhardt, CPhT Rx Patient Advocate Phone: [phone]

## 2022-11-09 NOTE — Patient Instructions (Addendum)
TOME 5 mg de [employer], con 2 tabletas de [employer].  Laboratorios realizados hoy, sus resultados estarn disponibles en MyChart, nos comunicaremos con usted para lecturas anormales.  Su mdico le recomienda que programe una cita de seguimiento en: 2 semanas  Si tiene alguna pregunta o inquietud antes de su prxima cita, envenos un mensaje a travs de mychart o llame a nuestra oficina al [phone].    PARA DEJAR UN MENSAJE PARA LA ENFERMERA SELECCIONE LA OPCIN 2, DEJE UN MENSAJE QUE INCLUYA: TE LLAMAS FECHA DE NACIMIENTO  NMERO DE DEVOLUCIN DE LLAMADA  MOTIVO DE LA LLAMADA**esto es importante ya que priorizamos las devoluciones de llamadas  RECIBIR UNA DEVOLUCIN DE LLAMADA EL MISMO DA SIEMPRE QUE LLAME ANTES DE LAS 4:00 P.M.  En la [facility], usted y sus necesidades de salud son Joel Beck prioridad. Como parte de nuestra misin continua de brindarle una atencin cardaca excepcional, hemos creado equipos de atencin para proveedores designados. Estos cuidados t

## 2022-11-09 NOTE — Progress Notes (Signed)
ReDS Vest / Clip - 11/09/22 0800       ReDS Vest / Clip   Station Marker C    Ruler Value 31    ReDS Value Range High volume overload    ReDS Actual Value 44

## 2022-11-11 NOTE — Progress Notes (Signed)
Advanced Heart Failure Clinic Note    Date:  11/11/2022   ID:  EUCLID CASSETTA, DOB 11-15-1956, MRN [id number]  PCP:  Arma Heading, MD (Inactive)  Cardiologist:  Dr. Johnsie Cancel HF Cardiologist: Dr. [name]   HPI: Joel Beck is a 66 y.o. male with NICM, HTN,  and chronic systolic heart failure diagnosed in 2015.    Admitted 5/22 for A/CHF. Diuresed with IV lasix. Repeat echo EF 20-25%. Discharged on po lasix prn.   Saw Dr [name] 06/21/21 and was volume overloaded. He was switched from lasix to torsemide 40 daily and instructed to take metolazone 2.5 mg for 2 days.   Returned for HF follow up 8/22 and was taking lasix and torsemide daily and had stopped his spiro. He was referred to paramedicine to help with medication. Unable to start Paramedicine because he said he had to many appointments.   Saw Dr [name] 10/2021 and doing fine. NYHA II.   Evaluated in the ED 01/23/22 for increased dyspnea. Had [place] 01/23/22 and has not felt good since that time. Given IV lasix and started on Legevrio for COVID. Discharged to home.   Evaluated in the ED 02/20/22 for abdominal bloating. Started on GI cocktail. Discharged to home later that day.   Follow up 02/22/22 volume overloaded, given 80 mg IV lasix + 40 KCL, ReDs 53%. Remained volume overloaded at follow up next day, ReDs 57% (? Accuracy), weight down 2 lbs. Given another dose of IV Lasix 80 mg + metolazone 2.5 mg x 1 + extra 40 KCL and increased daily torsemide to 80 bid starting the following day.  Echo 6/23 EF < 20%, RV moderately down, severe MR, mod-severe TR, IVC markedly dilated. Weight up 20 lbs, had been off torsemide x 2 months. Resumed torsemide 40 mg daily, instructed to take metolazone 2.5 mg/40 KCL x 2 days, then weekly thereafter.  Seen on 12/6 with marked volume overload after running out of metolazone. ReDS 50%. Was treated with metolazone x 2 days.   Here for f/u with interpreter. Feel much better. Weight down 12  pounds. Orthopnea and PND resolved. LE edema improved. No dizziness or lightheadedness.    Cardiac studies: - Echo (8/15): EF 25%  - Echo (7/17): EF 20-25%  - Echo (6/18): EF 20-25% Grade II DD - Echo (6/19): EF 20-25%, Grade I DD - Echo (5/22): EF 20-25%, RV ok. - Echo (6/23): EF < 20%, RV moderately down, severe MR, mod-severe TR.  - Cath by Dr Martinique 2015 with no CAD  Unable to tolerate cMRI due to claustrophobia.   - CPX Test (7/18): Peak VO2: 20.6 (74% predicted peak VO2) VE/VCO2 slope:  28 OUES: 2.55 Peak RER: 0.96 Submax test. Mildly reduced functional capacity. Mild chrontropic incompetence in setting of submax effort. No obvious HF or pulmonary limitation noted.   Review of systems complete and found to be negative unless listed in HPI.   Past Medical History:  Diagnosis Date   CHF (congestive heart failure) ([place]) 07/05/2014   Hypertension Dx 2015   Past Surgical History:  Procedure Laterality Date   LEFT HEART CATHETERIZATION WITH CORONARY ANGIOGRAM N/A 07/05/2014   Procedure: LEFT HEART CATHETERIZATION WITH CORONARY ANGIOGRAM;  Surgeon: Peter M Martinique, MD;  Location[facility] CATH LAB;  Service: Cardiovascular;  Laterality: N/A;   Current Outpatient Medications  Medication Sig Dispense Refill   aspirin EC 81 MG tablet Take 1 tablet (81 mg total) by mouth daily. 90 tablet 3   Multiple  Vitamins-Minerals (CENTRUM MEN) TABS Take 1 tablet by mouth daily.     potassium chloride SA (KLOR-CON M) 20 MEQ tablet Take 2 tablets (40 mEq total) by mouth every morning AND 1 tablet (20 mEq total) every evening. Take an extra 2 tablets when taking Metolazone. 120 tablet 3   sacubitril-valsartan (ENTRESTO) 97-103 MG Take 1 tablet by mouth 2 (two) times daily. 60 tablet 11   spironolactone (ALDACTONE) 25 MG tablet Take 0.5 tablets (12.5 mg total) by mouth daily. 45 tablet 3   torsemide (DEMADEX) 20 MG tablet Take 3 tablets (60 mg total) by mouth daily. 240 tablet 3   metolazone (ZAROXOLYN) 5  MG tablet Take 1 tablet (5 mg total) by mouth once a week. Every Monday with an EXTRA 2 POTASSIUM TABLETS 30 tablet 1   No current facility-administered medications for this encounter.   Allergies:   Ace inhibitors   Social History:  The patient  reports that he quit smoking about 45 years ago. His smoking use included cigarettes. He has never used smokeless tobacco. He reports current alcohol use of about 1.0 standard drink of alcohol per week. He reports that he does not use drugs.   Family History:  Father had high blood pressure and diabetes. Mother had no cardiac problems. No other pertinent family history.   Review of systems complete and found to be negative unless listed in HPI.    BP 110/78   Pulse 74   Wt 79.5 kg (175 lb 3.2 oz)   SpO2 99%   BMI 24.44 kg/m   Wt Readings from Last 3 Encounters:  11/09/22 79.5 kg (175 lb 3.2 oz)  11/07/22 85.5 kg (188 lb 6.4 oz)  10/16/22 74.8 kg (165 lb)   Physical Exam: General:  Well appearing. No resp difficulty HEENT: normal Neck: supple. JVP 10 Carotids 2+ bilat; no bruits. No lymphadenopathy or thryomegaly appreciated. Cor: PMI laterally displaced. Regular rate & rhythm. 2/6 MR Lungs: clear Abdomen: soft, nontender, nondistended. No hepatosplenomegaly. No bruits or masses. Good bowel sounds. Extremities: no cyanosis, clubbing, rash, trace-1+ edema Neuro: alert & orientedx3, cranial nerves grossly intact. moves all 4 extremities w/o difficulty. Affect pleasant   Assessment & Plan: 1.  Acute on chronic Systolic HF:  - Due to NICM. Suspect HTN in nature. EF has been down since 2015.  - LHC (2015) no CAD. - CPX (7/18) with suboptimal effort.  - Echo (6/19): LVEF 20-25%, Grade 1 DD.  - Echo (5/22): EF 20-25%, RV ok - Echo (6/23): EF < 20% RV moderately down Severe MR. Mod-severe TR IVC markedly dilated  - Improved NYHA III after volume removal - Still with some volume overload ReDS 44% - Continue torsemide 60 mg daily. - Give  metolazone 5mg  today then weekly with KCl 40  - Continue spironolactone 12.5 mg daily. - Continue Jardiance 10 mg daily. - Continue Entresto 97-103 mg bid.  - Hold carvedilol for now with potential low output - Pt intolerant to cMRI with claustrophobia.  - nearing end-stage. Will see back in 2 days. If not improved will admit.  - He is not [place] citizen so does not qualify for advanced therapies due to inability to get insurance coverage - labs today  2. HTN  - BP ok  3. Abdominal pain - likely multifactorial. Concern for low output - He has an umbilical hernia, has PCP follow up. - improved with diuresis  4. CKD 4 - Scr improved with diuresis 1.8 -> 1.58 - Recheck today  5. Noncompliance - Stress medication compliance   Total time spent 45 minutes. Over half that time spent discussing above.    Glori Bickers, MD  11/11/22 5:11 PM

## 2022-11-18 IMAGING — CR DG CHEST 2V
2 series · 2 of 2 positions shown · non-contrast
Comparison: 11/09/2021

CLINICAL DATA: Pneumonia. Shortness of breath, cough, and wheezing.

EXAM:
CHEST - 2 VIEW

[chest pa]
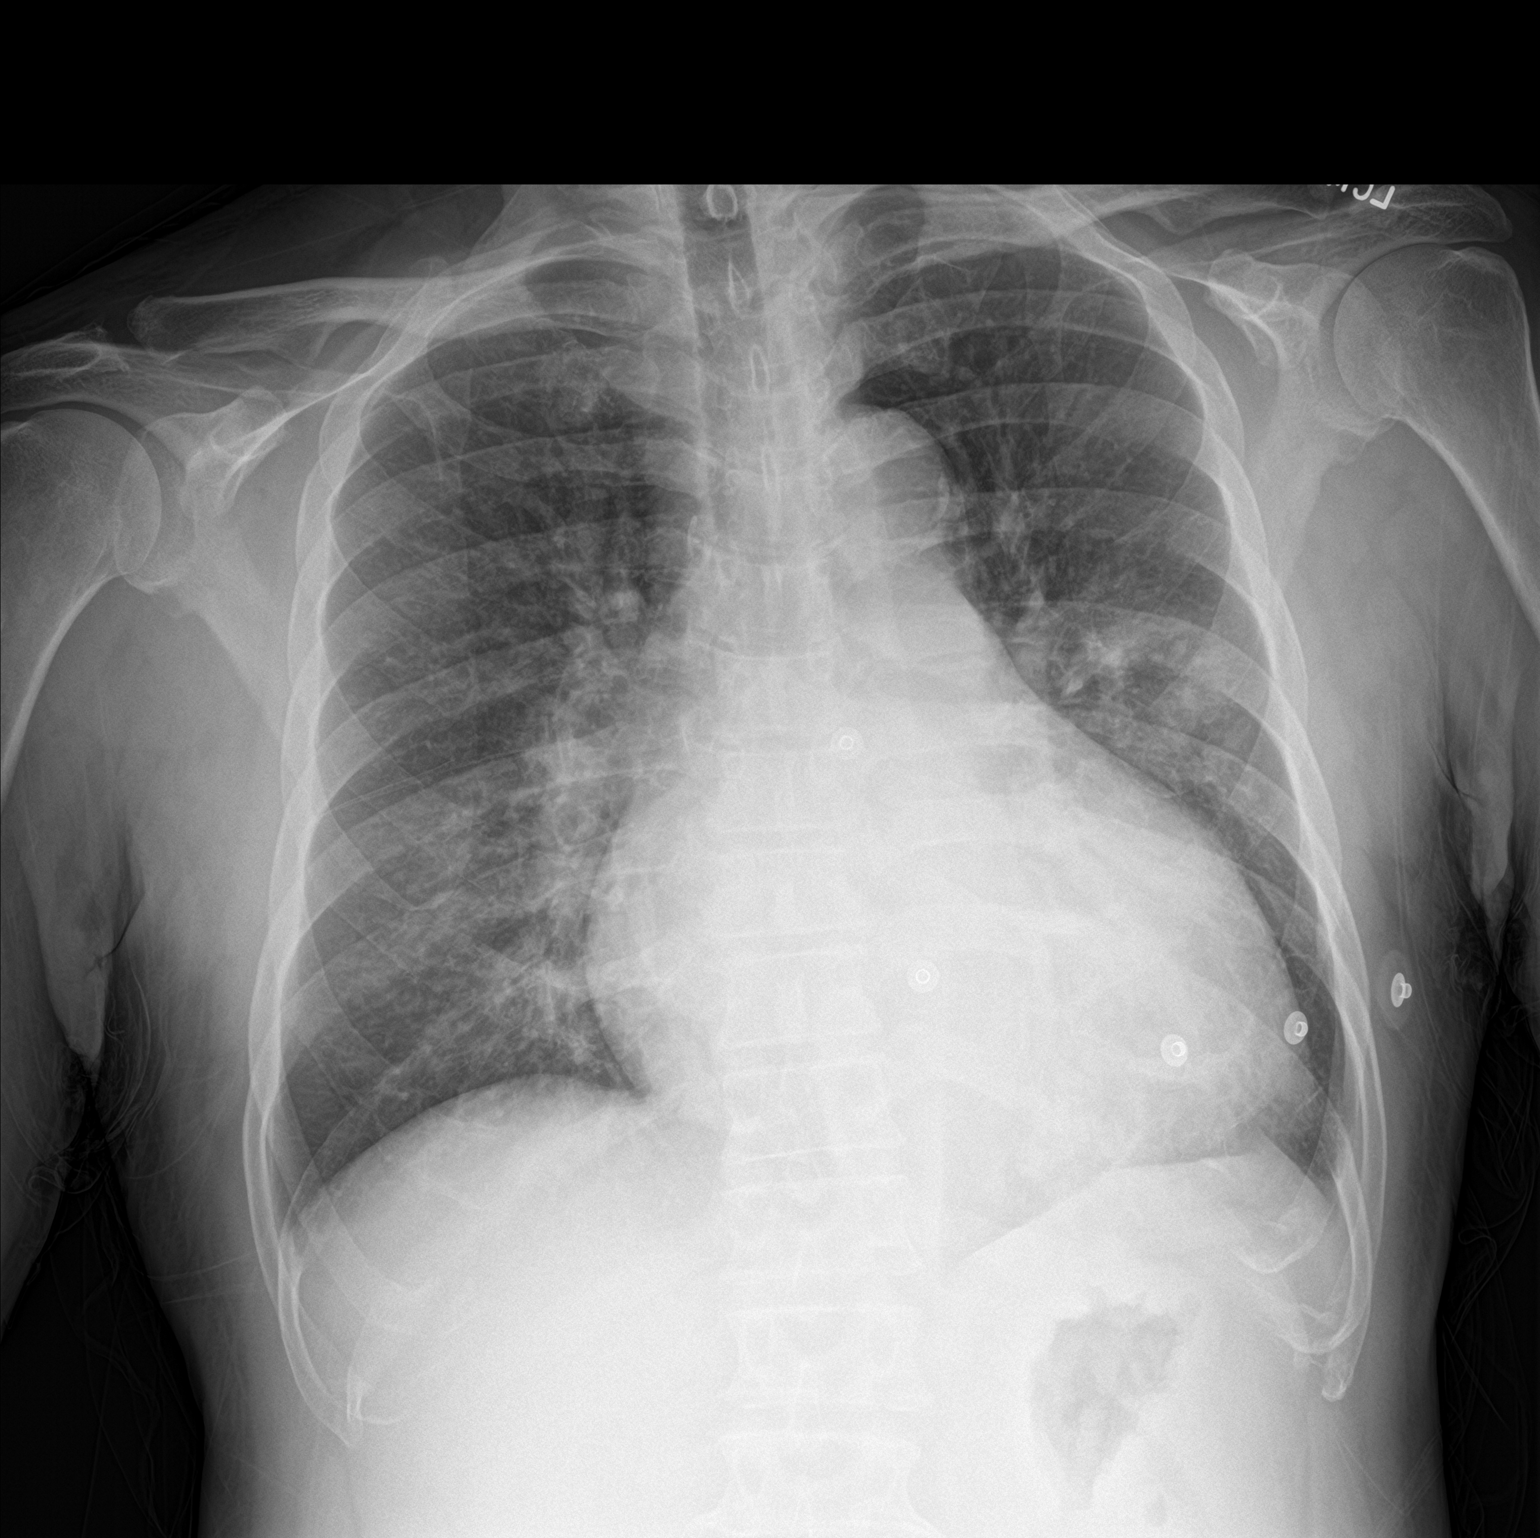

[chest lat]
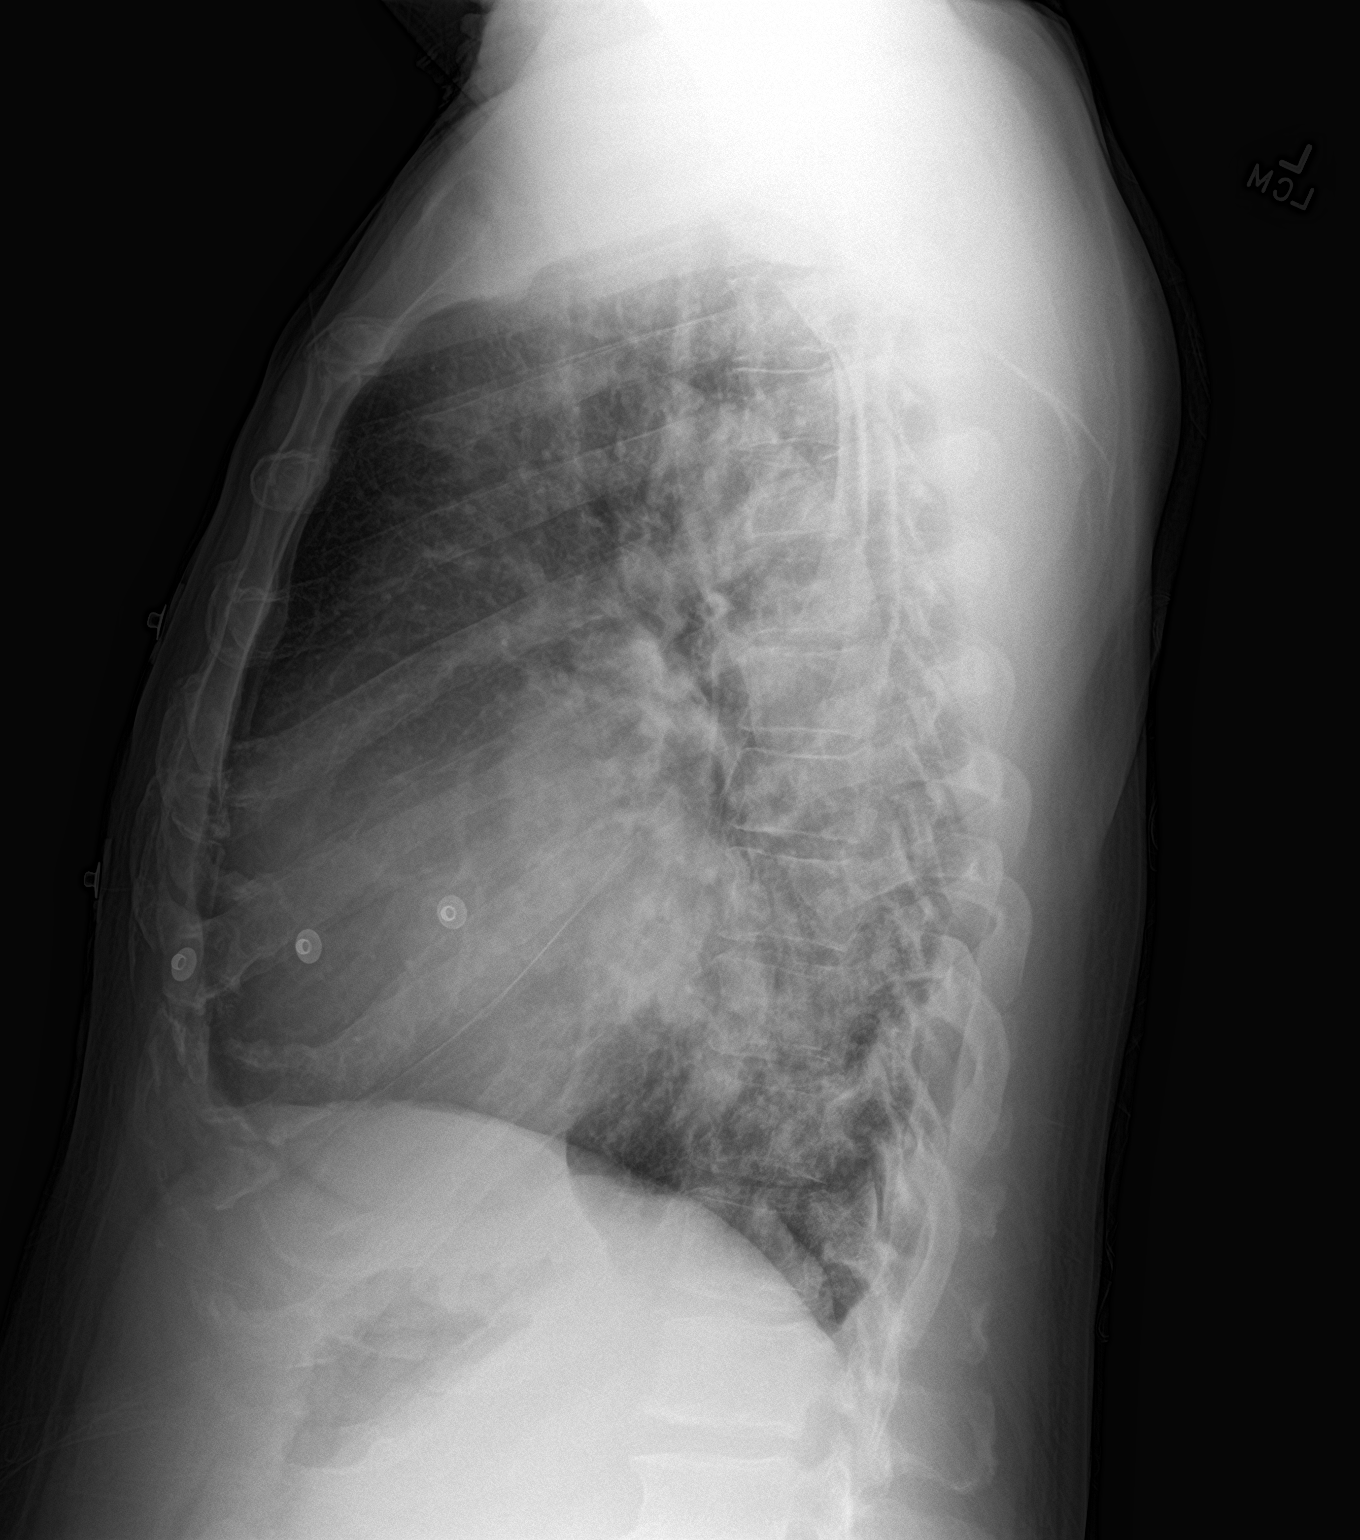

[2 of 2 positions shown; findings below may reference images not displayed]

FINDINGS: Cardiac silhouette is again moderately enlarged. Mediastinal
contours are within normal limits with mild calcification overlying
the aortic arch. Mildly increased perihilar interstitial thickening.
No pleural effusion or pneumothorax. No acute skeletal abnormality.
IMPRESSION: Mild perihilar edema, likely mild interstitial pulmonary edema.
Stable moderately enlarged cardiac silhouette.

## 2022-11-20 ENCOUNTER — Other Ambulatory Visit: Payer: Self-pay

## 2022-11-26 NOTE — Progress Notes (Signed)
Advanced Heart Failure Clinic Note    Date:  11/27/2022   ID:  Joel Beck, DOB [birth year], MRN [id number]  PCP:  Arma Heading, MD (Inactive)  Cardiologist:  Dr. Johnsie Cancel HF Cardiologist: Dr. [name]   HPI: Joel Beck is a 66 y.o. male with NICM, HTN,  and chronic systolic heart failure diagnosed in 2015.    Admitted 5/22 for A/CHF. Diuresed with IV lasix. Repeat echo EF 20-25%. Discharged on po lasix prn.   Saw Dr [name] 06/21/21 and was volume overloaded. He was switched from lasix to torsemide 40 daily and instructed to take metolazone 2.5 mg for 2 days.   Returned for HF follow up 8/22 and was taking lasix and torsemide daily and had stopped his spiro. He was referred to paramedicine to help with medication. Unable to start Paramedicine because he said he had to many appointments.   Saw Dr [name] 10/2021 and doing fine. NYHA II.   Evaluated in the ED 01/23/22 for increased dyspnea. Had [place] 01/23/22 and has not felt good since that time. Given IV lasix and started on Legevrio for COVID. Discharged to home.   Evaluated in the ED 02/20/22 for abdominal bloating. Started on GI cocktail. Discharged to home later that day.   Follow up 02/22/22 volume overloaded, given 80 mg IV lasix + 40 KCL, ReDs 53%. Remained volume overloaded at follow up next day, ReDs 57% (? Accuracy), weight down 2 lbs. Given another dose of IV Lasix 80 mg + metolazone 2.5 mg x 1 + extra 40 KCL and increased daily torsemide to 80 bid starting the following day.  Echo 6/23 EF < 20%, RV moderately down, severe MR, mod-severe TR, IVC markedly dilated. Weight up 20 lbs, had been off torsemide x 2 months. Resumed torsemide 40 mg daily, instructed to take metolazone 2.5 mg/40 KCL x 2 days, then weekly thereafter.  Seen on 12/6 with marked volume overload after running out of metolazone. ReDS 50%. Was treated with metolazone x 2 days.   Today he returns for HF follow up.Overall feeling mcuh better. Able to  walk up steps. Denies SOB/PND/Orthopnea. Appetite ok.Tries to cut back on fluid intake.  No fever or chills. He has not been weighing at home.  Taking all medications. He is not a [place] citizen.   Cardiac studies: - Echo (8/15): EF 25%  - Echo (7/17): EF 20-25%  - Echo (6/18): EF 20-25% Grade II DD - Echo (6/19): EF 20-25%, Grade I DD - Echo (5/22): EF 20-25%, RV ok. - Echo (6/23): EF < 20%, RV moderately down, severe MR, mod-severe TR.  - Cath by Dr Martinique 2015 with no CAD  Unable to tolerate cMRI due to claustrophobia.   - CPX Test (7/18): Peak VO2: 20.6 (74% predicted peak VO2) VE/VCO2 slope:  28 OUES: 2.55 Peak RER: 0.96 Submax test. Mildly reduced functional capacity. Mild chrontropic incompetence in setting of submax effort. No obvious HF or pulmonary limitation noted.   Review of systems complete and found to be negative unless listed in HPI.   Past Medical History:  Diagnosis Date   CHF (congestive heart failure) ([place]) 07/05/2014   Hypertension Dx 2015   Past Surgical History:  Procedure Laterality Date   LEFT HEART CATHETERIZATION WITH CORONARY ANGIOGRAM N/A 07/05/2014   Procedure: LEFT HEART CATHETERIZATION WITH CORONARY ANGIOGRAM;  Surgeon: Peter M Martinique, MD;  Location: [facility] CATH LAB;  Service: Cardiovascular;  Laterality: N/A;   Current Outpatient Medications  Medication Sig Dispense  Refill   aspirin EC 81 MG tablet Take 1 tablet (81 mg total) by mouth daily. 90 tablet 3   metolazone (ZAROXOLYN) 5 MG tablet Take 1 tablet (5 mg total) by mouth once a week. Every Monday with an EXTRA 2 POTASSIUM TABLETS 30 tablet 1   Multiple Vitamins-Minerals (CENTRUM MEN) TABS Take 1 tablet by mouth daily.     potassium chloride SA (KLOR-CON M) 20 MEQ tablet Take 2 tablets (40 mEq total) by mouth every morning AND 1 tablet (20 mEq total) every evening. Take an extra 2 tablets when taking Metolazone. 120 tablet 3   sacubitril-valsartan (ENTRESTO) 97-103 MG Take 1 tablet by mouth 2 (two)  times daily. 60 tablet 11   spironolactone (ALDACTONE) 25 MG tablet Take 0.5 tablets (12.5 mg total) by mouth daily. 45 tablet 3   torsemide (DEMADEX) 20 MG tablet Take 3 tablets (60 mg total) by mouth daily. 240 tablet 3   No current facility-administered medications for this encounter.   Allergies:   Ace inhibitors   Social History:  The patient  reports that he quit smoking about 46 years ago. His smoking use included cigarettes. He has never used smokeless tobacco. He reports current alcohol use of about 1.0 standard drink of alcohol per week. He reports that he does not use drugs.   Family History:  Father had high blood pressure and diabetes. Mother had no cardiac problems. No other pertinent family history.   Review of systems complete and found to be negative unless listed in HPI.    BP 108/89   Pulse 85   Wt 76 kg (167 lb 8 oz)   SpO2 99%   BMI 23.36 kg/m   Wt Readings from Last 3 Encounters:  11/27/22 76 kg (167 lb 8 oz)  11/09/22 79.5 kg (175 lb 3.2 oz)  11/07/22 85.5 kg (188 lb 6.4 oz)   Reds Clip 42%  Physical Exam: General:  Well appearing. No resp difficulty HEENT: normal Neck: supple. no JVD. Carotids 2+ bilat; no bruits. No lymphadenopathy or thryomegaly appreciated. Cor: PMI nondisplaced. Regular rate & rhythm. No rubs, gallops or murmurs. Lungs: clear Abdomen: soft, nontender, nondistended. No hepatosplenomegaly. No bruits or masses. Good bowel sounds. Extremities: no cyanosis, clubbing, rash, edema Neuro: alert & orientedx3, cranial nerves grossly intact. moves all 4 extremities w/o difficulty. Affect pleasant   Assessment & Plan: 1.  Chronic HFrEF :  - Due to NICM. Suspect HTN in nature. EF has been down since 2015.  - LHC (2015) no CAD. - CPX (7/18) with suboptimal effort.  - Echo (6/19): LVEF 20-25%, Grade 1 DD.  - Echo (5/22): EF 20-25%, RV ok - Echo (6/23): EF < 20% RV moderately down Severe MR. Mod-severe TR IVC markedly dilated  -NYHA II.  Volume status stable. Reds Clip 42%. Appears euvolemic. Continue  torsemide 60 mg daily + metolazone once a week. . - Continue spiro 12.5 mg daily. - Continue Jardiance 10 mg daily. - Continue Entresto 97-103 mg bid.  - Hold carvedilol for now with potential low output - Pt intolerant to cMRI with claustrophobia.  - nearing end-stage. He is not [place] citizen so does not qualify for advanced therapies due to inability to get insurance coverage - Discussed low salt food choices and limiting fluid intake.   - Check BMET today   2. HTN  -Stable  3. Abdominal pain - likely multifactorial. Concern for low output - He has an umbilical hernia, has PCP follow up. - improved with  diuresis - Resolved.   4. CKD Stage IIIb - Most recent creatinine 1.5, stable.  - Check BMET toeay   5. Noncompliance - Stress medication compliance   Follow up with in 6 -8 weeks with APP.    Darrick Grinder, NP  11/27/22 9:41 AM

## 2022-11-27 ENCOUNTER — Ambulatory Visit: Payer: Medicare Other | Admitting: Family Medicine

## 2022-11-27 ENCOUNTER — Other Ambulatory Visit (HOSPITAL_COMMUNITY): Payer: Self-pay

## 2022-11-27 ENCOUNTER — Ambulatory Visit (HOSPITAL_COMMUNITY)
Admission: RE | Admit: 2022-11-27 | Discharge: 2022-11-27 | Disposition: A | Discharge status: Home / Self Care | Payer: Medicare Other | Source: Ambulatory Visit | Attending: Adult Health | Admitting: Adult Health

## 2022-11-27 VITALS — BP 108/89 | HR 85 | Wt 167.5 lb

## 2022-11-27 DIAGNOSIS — Z833 Family history of diabetes mellitus: Secondary | ICD-10-CM | POA: Insufficient documentation

## 2022-11-27 DIAGNOSIS — I5022 Chronic systolic (congestive) heart failure: Secondary | ICD-10-CM | POA: Diagnosis not present

## 2022-11-27 DIAGNOSIS — Z91199 Patient's noncompliance with other medical treatment and regimen due to unspecified reason: Secondary | ICD-10-CM | POA: Diagnosis not present

## 2022-11-27 DIAGNOSIS — Z8249 Family history of ischemic heart disease and other diseases of the circulatory system: Secondary | ICD-10-CM | POA: Diagnosis not present

## 2022-11-27 DIAGNOSIS — I10 Essential (primary) hypertension: Secondary | ICD-10-CM

## 2022-11-27 DIAGNOSIS — K429 Umbilical hernia without obstruction or gangrene: Secondary | ICD-10-CM | POA: Diagnosis not present

## 2022-11-27 DIAGNOSIS — Z7982 Long term (current) use of aspirin: Secondary | ICD-10-CM | POA: Diagnosis not present

## 2022-11-27 DIAGNOSIS — F40240 Claustrophobia: Secondary | ICD-10-CM | POA: Insufficient documentation

## 2022-11-27 DIAGNOSIS — I428 Other cardiomyopathies: Secondary | ICD-10-CM | POA: Diagnosis not present

## 2022-11-27 DIAGNOSIS — N183 Chronic kidney disease, stage 3 unspecified: Secondary | ICD-10-CM

## 2022-11-27 DIAGNOSIS — I130 Hypertensive heart and chronic kidney disease with heart failure and stage 1 through stage 4 chronic kidney disease, or unspecified chronic kidney disease: Secondary | ICD-10-CM | POA: Insufficient documentation

## 2022-11-27 DIAGNOSIS — Z87891 Personal history of nicotine dependence: Secondary | ICD-10-CM | POA: Insufficient documentation

## 2022-11-27 DIAGNOSIS — R109 Unspecified abdominal pain: Secondary | ICD-10-CM | POA: Diagnosis not present

## 2022-11-27 DIAGNOSIS — Z7984 Long term (current) use of oral hypoglycemic drugs: Secondary | ICD-10-CM | POA: Insufficient documentation

## 2022-11-27 DIAGNOSIS — Z79899 Other long term (current) drug therapy: Secondary | ICD-10-CM | POA: Insufficient documentation

## 2022-11-27 DIAGNOSIS — N1832 Chronic kidney disease, stage 3b: Secondary | ICD-10-CM | POA: Insufficient documentation

## 2022-11-27 LAB — BASIC METABOLIC PANEL
Anion gap: 6 (ref 5–15)
BUN: 21 mg/dL (ref 8–23)
CO2: 29 mmol/L (ref 22–32)
Calcium: 9.2 mg/dL (ref 8.9–10.3)
Chloride: 106 mmol/L (ref 98–111)
Creatinine, Ser: 1.57 mg/dL — ABNORMAL HIGH (ref 0.61–1.24)
GFR, Estimated: 49 mL/min — ABNORMAL LOW (ref >60)
Glucose, Bld: 124 mg/dL — ABNORMAL HIGH (ref 70–99)
Potassium: 3.9 mmol/L (ref 3.5–5.1)
Sodium: 141 mmol/L (ref 135–145)

## 2022-11-27 NOTE — Patient Instructions (Addendum)
  Continuar con los [employer], sin cambios.  Su mdico recomienda que programe una cita de seguimiento en: [place] LOS DAS: 1) Psate por la maana antes del desayuno. Escrbalo y gurdelo en un registro. 2) Tome sus medicamentos segn lo prescrito 3) Consuma alimentos bajos en sal: limite la sal (sodio) a 2000 mg por da. 4) Mantente lo ms activo que [employer] 5) Limite todos los lquidos del da a menos de 2 litros.  Si tiene [place] pregunta o inquietud antes de su prxima cita, envenos un mensaje a travs de mychart o llame a nuestra oficina al [phone].  PARA DEJAR UN MENSAJE A LA ENFERMERA, SELECCIONE LA OPCIN 2, DEJE UN MENSAJE QUE INCLUYA:  SU NOMBRE  FECHA DE NACIMIENTO  NMERO DE DEVOLUCIN DE LLAMADA  MOTIVO DE LA LLAMADA**esto es importante ya que priorizamos las devoluciones de llamadas  RECIBIRS UNA LLAMADA EL MISMO DA SIEMPRE QUE LLAME ANTES DE LAS 4:00 PM  En la [facility], usted y sus necesidades de salud son Cleotis Nipper prioridad. Como parte de nuestra misin continua de brindarle una atencin cardaca excepcional, hemos creado equipos de atencin de proveedores designados. Estos equipos de atencin incluyen a su cardilogo principal (mdico) y proveedores de Financial planner (APP: asistentes mdicos y enfermeras practicantes), quienes trabajan juntos para brindarle la atencin que necesita, cuando la necesita.  Puede consultar a cualquiera de los siguientes proveedores en su equipo de atencin designado en su prximo seguimiento:  Dr. Quillian Quince Bensimhon  Dr. Loralie Champagne  Dr. Hebert Soho  Amy Ninfa Meeker, NP  Brittainy Rosita Fire, [place]  Allena Katz, NP  Marlyce Huge, [place]  Alma Daz, NP  Audry Riles, doctora en farmacia   Asegrese de traer todos los frascos de sus medicamentos a cada cita.

## 2022-11-27 NOTE — Telephone Encounter (Signed)
Advanced Heart Failure Patient Advocate Encounter  The patient was approved for a [place] that will help cover the cost of Entresto.  Total amount awarded, $10,000.  Effective: 10/28/22 - 10/28/23.  BIN [id number] PCN [id number] Group [id number] ID [id number]  Processing information added to Joel Beck, CPhT Rx Patient Advocate Phone: [phone]

## 2022-11-27 NOTE — Progress Notes (Signed)
ReDS Vest / Clip - 11/27/22 0900       ReDS Vest / Clip   Station Marker C    Ruler Value 28    ReDS Value Range High volume overload    ReDS Actual Value 42

## 2022-12-19 ENCOUNTER — Other Ambulatory Visit: Payer: Self-pay

## 2022-12-27 ENCOUNTER — Ambulatory Visit: Payer: Medicare Other | Admitting: Emergency Medicine

## 2023-01-11 ENCOUNTER — Telehealth (HOSPITAL_COMMUNITY): Payer: Self-pay

## 2023-01-11 NOTE — Progress Notes (Signed)
Advanced Heart Failure Clinic Note    Date:  01/11/2023   ID:  DARSHAWN Beck, DOB November 10, 1956, MRN [id number]  PCP:  Arma Heading, MD (Inactive)  Cardiologist:  Dr. Johnsie Cancel HF Cardiologist: Dr. [name]   HPI: Joel Beck is a 67 y.o. male with NICM, HTN,  and chronic systolic heart failure diagnosed in 2015.    Admitted 5/22 for A/CHF. Diuresed with IV lasix. Repeat echo EF 20-25%. Discharged on po lasix prn.   Saw Dr [name] 06/21/21 and was volume overloaded. He was switched from lasix to torsemide 40 daily and instructed to take metolazone 2.5 mg for 2 days.   Returned for HF follow up 8/22 and was taking lasix and torsemide daily and had stopped his spiro. He was referred to paramedicine to help with medication. Unable to start Paramedicine because he said he had to many appointments.   Saw Dr [name] 10/2021 and doing fine. NYHA II.   Evaluated in the ED 01/23/22 for increased dyspnea. Had [place] 01/23/22 and has not felt good since that time. Given IV lasix and started on Lege[place]rio for COVID. Discharged to home.   Evaluated in the ED 02/20/22 for abdominal bloating. Started on GI cocktail. Discharged to home later that day.   Follow up 02/22/22 volume overloaded, given 80 mg IV lasix + 40 KCL, ReDs 53%. Remained volume overloaded at follow up next day, ReDs 57% (? Accuracy), weight down 2 lbs. Given another dose of IV Lasix 80 mg + metolazone 2.5 mg x 1 + extra 40 KCL and increased daily torsemide to 80 bid starting the following day.  Echo 6/23 EF < 20%, RV moderately down, severe MR, mod-severe TR, IVC markedly dilated. Weight up 20 lbs, had been off torsemide x 2 months. Resumed torsemide 40 mg daily, instructed to take metolazone 2.5 mg/40 KCL x 2 days, then weekly thereafter.  Seen on 12/6 with marked volume overload after running out of metolazone. ReDS 50%. Was treated with metolazone x 2 days.   Today he returns for HF follow up.Overall feeling mcuh better. Able to  walk up steps. Denies SOB/PND/Orthopnea. Appetite ok.Tries to cut back on fluid intake.  No fever or chills. He has not been weighing at home.  Taking all medications. He is not a [place] citizen.   Cardiac studies: - Echo (8/15): EF 25%  - Echo (7/17): EF 20-25%  - Echo (6/18): EF 20-25% Grade II DD - Echo (6/19): EF 20-25%, Grade I DD - Echo (5/22): EF 20-25%, RV ok. - Echo (6/23): EF < 20%, RV moderately down, severe MR, mod-severe TR.  - Cath by Dr Martinique 2015 with no CAD  Unable to tolerate cMRI due to claustrophobia.   - CPX Test (7/18): Peak VO2: 20.6 (74% predicted peak VO2) VE/VCO2 slope:  28 OUES: 2.55 Peak RER: 0.96 Submax test. Mildly reduced functional capacity. Mild chrontropic incompetence in setting of submax effort. No obvious HF or pulmonary limitation noted.   Review of systems complete and found to be negative unless listed in HPI.   Past Medical History:  Diagnosis Date   CHF (congestive heart failure) ([facility]) 07/05/2014   Hypertension Dx 2015   Past Surgical History:  Procedure Laterality Date   LEFT HEART CATHETERIZATION WITH CORONARY ANGIOGRAM N/A 07/05/2014   Procedure: LEFT HEART CATHETERIZATION WITH CORONARY ANGIOGRAM;  Surgeon: Peter M Martinique, MD;  [facility];  Service: Cardiovascular;  Laterality: N/A;   Current Outpatient Medications  Medication Sig Dispense  Refill   aspirin EC 81 MG tablet Take 1 tablet (81 mg total) by mouth daily. 90 tablet 3   metolazone (ZAROXOLYN) 5 MG tablet Take 1 tablet (5 mg total) by mouth once a week. Every Monday with an EXTRA 2 POTASSIUM TABLETS 30 tablet 1   Multiple Vitamins-Minerals (CENTRUM MEN) TABS Take 1 tablet by mouth daily.     potassium chloride SA (KLOR-CON M) 20 MEQ tablet Take 2 tablets (40 mEq total) by mouth every morning AND 1 tablet (20 mEq total) every evening. Take an extra 2 tablets when taking Metolazone. 120 tablet 3   sacubitril-valsartan (ENTRESTO) 97-103 MG Take 1 tablet by mouth 2 (two)  times daily. 60 tablet 11   spironolactone (ALDACTONE) 25 MG tablet Take 0.5 tablets (12.5 mg total) by mouth daily. 45 tablet 3   torsemide (DEMADEX) 20 MG tablet Take 3 tablets (60 mg total) by mouth daily. 240 tablet 3   No current facility-administered medications for this visit.   Allergies:   Ace inhibitors   Social History:  The patient  reports that he quit smoking about 46 years ago. His smoking use included cigarettes. He has never used smokeless tobacco. He reports current alcohol use of about 1.0 standard drink of alcohol per week. He reports that he does not use drugs.   Family History:  Father had high blood pressure and diabetes. Mother had no cardiac problems. No other pertinent family history.   Review of systems complete and found to be negative unless listed in HPI.    There were no vitals taken for this visit.  Wt Readings from Last 3 Encounters:  11/27/22 76 kg (167 lb 8 oz)  11/09/22 79.5 kg (175 lb 3.2 oz)  11/07/22 85.5 kg (188 lb 6.4 oz)   Reds Clip 42%  Physical Exam: General:  Well appearing. No resp difficulty HEENT: normal Neck: supple. no JVD. Carotids 2+ bilat; no bruits. No lymphadenopathy or thryomegaly appreciated. Cor: PMI nondisplaced. Regular rate & rhythm. No rubs, gallops or murmurs. Lungs: clear Abdomen: soft, nontender, nondistended. No hepatosplenomegaly. No bruits or masses. Good bowel sounds. Extremities: no cyanosis, clubbing, rash, edema Neuro: alert & orientedx3, cranial nerves grossly intact. moves all 4 extremities w/o difficulty. Affect pleasant   Assessment & Plan: 1.  Chronic HFrEF :  - Due to NICM. Suspect HTN in nature. EF has been down since 2015.  - LHC (2015) no CAD. - CPX (7/18) with suboptimal effort.  - Echo (6/19): LVEF 20-25%, Grade 1 DD.  - Echo (5/22): EF 20-25%, RV ok - Echo (6/23): EF < 20% RV moderately down Severe MR. Mod-severe TR IVC markedly dilated  -NYHA II. Volume status stable. Reds Clip 42%. Appears  euvolemic. Continue  torsemide 60 mg daily + metolazone once a week. . - Continue spiro 12.5 mg daily. - Continue Jardiance 10 mg daily. - Continue Entresto 97-103 mg bid.  - Hold carvedilol for now with potential low output - Pt intolerant to cMRI with claustrophobia.  - nearing end-stage. He is not [place] citizen so does not qualify for advanced therapies due to inability to get insurance coverage - Discussed low salt food choices and limiting fluid intake.   - Check BMET today   2. HTN  -Stable  3. Abdominal pain - likely multifactorial. Concern for low output - He has an umbilical hernia, has PCP follow up. - improved with diuresis - Resolved.   4. CKD Stage IIIb - Most recent creatinine 1.5, stable.  -  Check BMET toeay   5. Noncompliance - Stress medication compliance   Follow up with in 6 -8 weeks with APP.    [place], FNP  01/11/23 9:34 AM

## 2023-01-11 NOTE — Telephone Encounter (Signed)
Left message to confirm appointment for 01/14/23

## 2023-01-14 ENCOUNTER — Other Ambulatory Visit: Payer: Self-pay

## 2023-01-14 ENCOUNTER — Other Ambulatory Visit (HOSPITAL_COMMUNITY): Payer: Self-pay

## 2023-01-14 ENCOUNTER — Ambulatory Visit (HOSPITAL_COMMUNITY)
Admission: RE | Admit: 2023-01-14 | Discharge: 2023-01-14 | Disposition: A | Discharge status: Home / Self Care | Payer: Medicare (Managed Care) | Source: Ambulatory Visit | Attending: Family Medicine | Admitting: Family Medicine

## 2023-01-14 ENCOUNTER — Encounter (HOSPITAL_COMMUNITY): Payer: Self-pay

## 2023-01-14 VITALS — BP 140/90 | HR 95 | Wt 168.0 lb

## 2023-01-14 DIAGNOSIS — K429 Umbilical hernia without obstruction or gangrene: Secondary | ICD-10-CM | POA: Insufficient documentation

## 2023-01-14 DIAGNOSIS — R109 Unspecified abdominal pain: Secondary | ICD-10-CM | POA: Insufficient documentation

## 2023-01-14 DIAGNOSIS — Z79899 Other long term (current) drug therapy: Secondary | ICD-10-CM | POA: Insufficient documentation

## 2023-01-14 DIAGNOSIS — N1832 Chronic kidney disease, stage 3b: Secondary | ICD-10-CM | POA: Diagnosis not present

## 2023-01-14 DIAGNOSIS — Z91199 Patient's noncompliance with other medical treatment and regimen due to unspecified reason: Secondary | ICD-10-CM | POA: Diagnosis not present

## 2023-01-14 DIAGNOSIS — R059 Cough, unspecified: Secondary | ICD-10-CM | POA: Diagnosis present

## 2023-01-14 DIAGNOSIS — Z7984 Long term (current) use of oral hypoglycemic drugs: Secondary | ICD-10-CM | POA: Diagnosis not present

## 2023-01-14 DIAGNOSIS — I428 Other cardiomyopathies: Secondary | ICD-10-CM | POA: Diagnosis not present

## 2023-01-14 DIAGNOSIS — I130 Hypertensive heart and chronic kidney disease with heart failure and stage 1 through stage 4 chronic kidney disease, or unspecified chronic kidney disease: Secondary | ICD-10-CM | POA: Diagnosis not present

## 2023-01-14 DIAGNOSIS — N183 Chronic kidney disease, stage 3 unspecified: Secondary | ICD-10-CM | POA: Diagnosis not present

## 2023-01-14 DIAGNOSIS — I10 Essential (primary) hypertension: Secondary | ICD-10-CM | POA: Diagnosis not present

## 2023-01-14 DIAGNOSIS — F40240 Claustrophobia: Secondary | ICD-10-CM | POA: Diagnosis not present

## 2023-01-14 DIAGNOSIS — I5022 Chronic systolic (congestive) heart failure: Secondary | ICD-10-CM

## 2023-01-14 LAB — BASIC METABOLIC PANEL
Anion gap: 7 (ref 5–15)
BUN: 20 mg/dL (ref 8–23)
CO2: 28 mmol/L (ref 22–32)
Calcium: 9.3 mg/dL (ref 8.9–10.3)
Chloride: 104 mmol/L (ref 98–111)
Creatinine, Ser: 1.21 mg/dL (ref 0.61–1.24)
GFR, Estimated: >60 mL/min (ref >60)
Glucose, Bld: 90 mg/dL (ref 70–99)
Potassium: 3.6 mmol/L (ref 3.5–5.1)
Sodium: 139 mmol/L (ref 135–145)

## 2023-01-14 LAB — BRAIN NATRIURETIC PEPTIDE: B Natriuretic Peptide: 3094.9 pg/mL — ABNORMAL HIGH (ref 0.0–100.0)

## 2023-01-14 MED ORDER — SPIRONOLACTONE 25 MG PO TABS
12.5000 mg | ORAL_TABLET | Freq: Every day | ORAL | 3 refills | Status: DC
  Filled 2023-01-14: qty 45, 90d supply, fill #0
  Filled 2023-06-05: qty 45, 90d supply, fill #1

## 2023-01-14 MED ORDER — METOLAZONE 5 MG PO TABS
5.0000 mg | ORAL_TABLET | ORAL | 1 refills | Status: DC
  Filled 2023-01-14: qty 12, 84d supply, fill #0
  Filled 2023-03-27: qty 12, 84d supply, fill #1
  Filled 2023-06-04: qty 12, 84d supply, fill #2
  Filled 2023-11-04: qty 12, 84d supply, fill #3

## 2023-01-14 MED ORDER — TORSEMIDE 20 MG PO TABS
20.0000 mg | ORAL_TABLET | Freq: Two times a day (BID) | ORAL | 3 refills | Status: DC
  Filled 2023-01-14: qty 180, 90d supply, fill #0

## 2023-01-14 MED ORDER — EMPAGLIFLOZIN 10 MG PO TABS
10.0000 mg | ORAL_TABLET | Freq: Every day | ORAL | 4 refills | Status: DC
  Filled 2023-01-14: qty 30, 30d supply, fill #0
  Filled 2023-03-27: qty 30, 30d supply, fill #1
  Filled 2023-09-13: qty 30, 30d supply, fill #2

## 2023-01-14 MED ORDER — POTASSIUM CHLORIDE CRYS ER 20 MEQ PO TBCR
EXTENDED_RELEASE_TABLET | ORAL | 3 refills | Status: DC
  Filled 2023-01-14: qty 120, 40d supply, fill #0
  Filled 2023-06-04: qty 120, 40d supply, fill #1

## 2023-01-14 MED ORDER — TORSEMIDE 20 MG PO TABS
60.0000 mg | ORAL_TABLET | Freq: Every day | ORAL | 3 refills | Status: DC
  Filled 2023-01-14: qty 240, 80d supply, fill #0

## 2023-01-14 MED ORDER — TORSEMIDE 20 MG PO TABS
80.0000 mg | ORAL_TABLET | Freq: Every day | ORAL | 3 refills | Status: DC
  Filled 2023-01-14: qty 180, 45d supply, fill #0

## 2023-01-14 NOTE — Progress Notes (Signed)
ReDS Vest / Clip - 01/14/23 1000       ReDS Vest / Clip   Station Marker C    Ruler Value 28    ReDS Value Range High volume overload    ReDS Actual Value 42

## 2023-01-14 NOTE — Patient Instructions (Addendum)
Medication Changes:  CHANGE: Torsemide (15m). Take 4 tablets (total 864m, daily.   Lab Work:  Labs done today, your results will be available in MyChart, we will contact you for abnormal readings.  You have repeat lab work scheduled for 2/19 @ 2:45pm.   Special Instructions // Education:  Do the following things EVERYDAY: Weigh yourself in the morning before breakfast. Write it down and keep it in a log. Take your medicines as prescribed Eat low salt foods--Limit salt (sodium) to 2000 mg per day.  Stay as active as you can everyday Limit all fluids for the day to less than 2 liters  Follow-Up in: in 2-3 months with Dr. BeHaroldine LawsOur office will call you to schedule this appointment when Dr. B'Sharmaine Basechedule is ready.      At the [facility] and your health needs are our priority. We have a designated team specialized in the treatment of Heart Failure. This Care Team includes your primary Heart Failure Specialized Cardiologist (physician), Advanced Practice Providers (APPs- Physician Assistants and Nurse Practitioners), and Pharmacist who all work together to provide you with the care you need, when you need it.   You may see any of the following providers on your designated Care Team at your next follow up:  Dr. DaGlori Bickersr. DaLoralie Champagner. AdRoxana HiresNP BrLyda JesterPAUtaheMarion Eye Surgery Center LLCiPetermanPAUtahlForestine NaNP LaAudry RilesPharmD   Please be sure to bring in all your medications bottles to every appointment.   Need to Contact UsKorea If you have any questions or concerns before your next appointment please send usK[place] message through myBeaverr call our office at [phone]   TO LEAVE A MESSAGE FOR THE NURSE SELECT OPTION 2, PLEASE LEAVE A MESSAGE INCLUDING: YOUR NAME DATE OF BIRTH CALL BACK NUMBER REASON FOR CALL**this is important as we prioritize the call backs  YOU WILL RECEIVE A CALL BACK THE SAME  DAY AS LONG AS YOU CALL BEFORE 4:00 PM

## 2023-01-21 ENCOUNTER — Ambulatory Visit (HOSPITAL_COMMUNITY)
Admission: RE | Admit: 2023-01-21 | Discharge: 2023-01-21 | Disposition: A | Discharge status: Home / Self Care | Payer: Medicare (Managed Care) | Source: Ambulatory Visit | Attending: Cardiology | Admitting: Cardiology

## 2023-01-21 ENCOUNTER — Other Ambulatory Visit: Payer: Self-pay

## 2023-01-21 DIAGNOSIS — I5022 Chronic systolic (congestive) heart failure: Secondary | ICD-10-CM | POA: Insufficient documentation

## 2023-01-21 LAB — BASIC METABOLIC PANEL
Anion gap: 5 (ref 5–15)
BUN: 20 mg/dL (ref 8–23)
CO2: 31 mmol/L (ref 22–32)
Calcium: 9.1 mg/dL (ref 8.9–10.3)
Chloride: 101 mmol/L (ref 98–111)
Creatinine, Ser: 1.42 mg/dL — ABNORMAL HIGH (ref 0.61–1.24)
GFR, Estimated: 55 mL/min — ABNORMAL LOW (ref >60)
Glucose, Bld: 90 mg/dL (ref 70–99)
Potassium: 4 mmol/L (ref 3.5–5.1)
Sodium: 137 mmol/L (ref 135–145)

## 2023-02-27 ENCOUNTER — Other Ambulatory Visit: Payer: Self-pay

## 2023-03-27 ENCOUNTER — Other Ambulatory Visit: Payer: Self-pay

## 2023-03-28 ENCOUNTER — Other Ambulatory Visit: Payer: Self-pay

## 2023-05-01 DIAGNOSIS — I509 Heart failure, unspecified: Secondary | ICD-10-CM | POA: Diagnosis not present

## 2023-05-01 DIAGNOSIS — R1033 Periumbilical pain: Secondary | ICD-10-CM | POA: Diagnosis not present

## 2023-05-01 DIAGNOSIS — R000 Tachycardia, unspecified: Secondary | ICD-10-CM | POA: Diagnosis not present

## 2023-05-01 DIAGNOSIS — K429 Umbilical hernia without obstruction or gangrene: Secondary | ICD-10-CM | POA: Diagnosis not present

## 2023-05-02 DIAGNOSIS — I517 Cardiomegaly: Secondary | ICD-10-CM | POA: Diagnosis not present

## 2023-05-02 DIAGNOSIS — R000 Tachycardia, unspecified: Secondary | ICD-10-CM | POA: Diagnosis not present

## 2023-06-04 ENCOUNTER — Other Ambulatory Visit (HOSPITAL_COMMUNITY): Payer: Self-pay | Admitting: Family Medicine

## 2023-06-04 ENCOUNTER — Other Ambulatory Visit: Payer: Self-pay

## 2023-06-04 MED ORDER — ENTRESTO 97-103 MG PO TABS
1.0000 | ORAL_TABLET | Freq: Two times a day (BID) | ORAL | 11 refills | Status: DC
  Filled 2023-06-04: qty 60, 30d supply, fill #0
  Filled 2023-07-22: qty 60, 30d supply, fill #1
  Filled 2023-09-13: qty 60, 30d supply, fill #2
  Filled 2023-11-04: qty 60, 30d supply, fill #3
  Filled 2024-01-06: qty 60, 30d supply, fill #4
  Filled 2024-02-12: qty 60, 30d supply, fill #5

## 2023-06-05 ENCOUNTER — Other Ambulatory Visit: Payer: Self-pay

## 2023-06-13 ENCOUNTER — Other Ambulatory Visit: Payer: Self-pay

## 2023-06-13 ENCOUNTER — Encounter (HOSPITAL_COMMUNITY): Payer: Self-pay | Admitting: Internal Medicine

## 2023-06-13 ENCOUNTER — Ambulatory Visit (HOSPITAL_COMMUNITY)
Admission: RE | Admit: 2023-06-13 | Discharge: 2023-06-13 | Disposition: A | Discharge status: Home / Self Care | Payer: Medicare (Managed Care) | Source: Ambulatory Visit | Attending: Internal Medicine | Admitting: Internal Medicine

## 2023-06-13 VITALS — BP 102/78 | HR 82 | Wt 170.0 lb

## 2023-06-13 DIAGNOSIS — Z91199 Patient's noncompliance with other medical treatment and regimen due to unspecified reason: Secondary | ICD-10-CM | POA: Insufficient documentation

## 2023-06-13 DIAGNOSIS — I5022 Chronic systolic (congestive) heart failure: Secondary | ICD-10-CM | POA: Insufficient documentation

## 2023-06-13 DIAGNOSIS — Z01810 Encounter for preprocedural cardiovascular examination: Secondary | ICD-10-CM

## 2023-06-13 DIAGNOSIS — Z7984 Long term (current) use of oral hypoglycemic drugs: Secondary | ICD-10-CM | POA: Diagnosis not present

## 2023-06-13 DIAGNOSIS — I130 Hypertensive heart and chronic kidney disease with heart failure and stage 1 through stage 4 chronic kidney disease, or unspecified chronic kidney disease: Secondary | ICD-10-CM | POA: Diagnosis not present

## 2023-06-13 DIAGNOSIS — F40240 Claustrophobia: Secondary | ICD-10-CM | POA: Diagnosis not present

## 2023-06-13 DIAGNOSIS — N183 Chronic kidney disease, stage 3 unspecified: Secondary | ICD-10-CM

## 2023-06-13 DIAGNOSIS — N1832 Chronic kidney disease, stage 3b: Secondary | ICD-10-CM | POA: Insufficient documentation

## 2023-06-13 DIAGNOSIS — I428 Other cardiomyopathies: Secondary | ICD-10-CM | POA: Insufficient documentation

## 2023-06-13 DIAGNOSIS — I10 Essential (primary) hypertension: Secondary | ICD-10-CM

## 2023-06-13 LAB — BRAIN NATRIURETIC PEPTIDE: B Natriuretic Peptide: 2293.4 pg/mL — ABNORMAL HIGH (ref 0.0–100.0)

## 2023-06-13 LAB — BASIC METABOLIC PANEL
Anion gap: 6 (ref 5–15)
BUN: 16 mg/dL (ref 8–23)
CO2: 28 mmol/L (ref 22–32)
Calcium: 8.9 mg/dL (ref 8.9–10.3)
Chloride: 106 mmol/L (ref 98–111)
Creatinine, Ser: 1.49 mg/dL — ABNORMAL HIGH (ref 0.61–1.24)
GFR, Estimated: 52 mL/min — ABNORMAL LOW (ref >60)
Glucose, Bld: 111 mg/dL — ABNORMAL HIGH (ref 70–99)
Potassium: 3.7 mmol/L (ref 3.5–5.1)
Sodium: 140 mmol/L (ref 135–145)

## 2023-06-13 MED ORDER — TORSEMIDE 20 MG PO TABS
40.0000 mg | ORAL_TABLET | Freq: Every day | ORAL | 3 refills | Status: DC
  Filled 2023-06-13: qty 60, 30d supply, fill #0

## 2023-06-13 NOTE — Progress Notes (Signed)
ReDS Vest / Clip - 06/13/23 1000       ReDS Vest / Clip   Station Marker C    Ruler Value 25    ReDS Value Range High volume overload    ReDS Actual Value 49

## 2023-06-13 NOTE — Progress Notes (Signed)
Advanced Heart Failure Clinic Note    Date:  06/13/2023   ID:  Joel Beck, DOB Apr 23, 1956, MRN [id number]  PCP:  Zettie Cooley, MD (Inactive)  Primary Cardiologist:  Dr. Eden Emms HF Cardiologist: Dr. Gala Romney   HPI: Joel Beck is a 67 y.o. male with NICM, HTN, noncompliance and chronic systolic heart failure diagnosed in 2015.    Admitted 5/22 for A/CHF. Diuresed with IV lasix. Repeat echo EF 20-25%.  HF follow  8/22 was taking lasix and torsemide daily and had stopped his spiro. He was referred to paramedicine to help with medication. Unable to start Paramedicine because he said he had too many appointments.   Follow up 02/22/22 volume overloaded, given 80 mg IV lasix + 40 KCL, ReDs 53%.   Echo 6/23 EF < 20%, RV moderately down, severe MR, mod-severe TR, IVC markedly dilated. Weight up 20 lbs, had been off torsemide x 2 months. Resumed torsemide 40 mg daily, instructed to take metolazone 2.5 mg/40 KCL x 2 days, then weekly thereafter.  Has had multiple office visits with volume overload  Follow up 2/24 Volume up. REDs 42%. Torsemide increased to 80 daily   Today he returns for HF follow up with interpretor. Overall feels good. Denies SOB, edema, orthopnea or PND.  Only taking torsemide 20 daily supposed to be on 40 ReDs 49%    Cardiac studies: - Echo (8/15): EF 25%  - Echo (7/17): EF 20-25%  - Echo (6/18): EF 20-25% Grade II DD - Echo (6/19): EF 20-25%, Grade I DD - Echo (5/22): EF 20-25%, RV ok. - Echo (6/23): EF < 20%, RV moderately down, severe MR, mod-severe TR.  - Cath by Dr Sw[place] 2015 with no CAD  Unable to tolerate cMRI due to claustrophobia.   - CPX Test (7/18): Peak VO2: 20.6 (74% predicted peak VO2) VE/VCO2 slope:  28 OUES: 2.55 Peak RER: 0.96 Submax test. Mildly reduced functional capacity. Mild chrontropic incompetence in setting of submax effort. No obvious HF or pulmonary limitation noted.   Review of systems complete and found to be negative  unless listed in HPI.   Past Medical History:  Diagnosis Date   CHF (congestive heart failure) ([facility]) 07/05/2014   Hypertension Dx 2015   Past Surgical History:  Procedure Laterality Date   LEFT HEART CATHETERIZATION WITH CORONARY ANGIOGRAM N/A 07/05/2014   Procedure: LEFT HEART CATHETERIZATION WITH CORONARY ANGIOGRAM;  Surgeon: Peter M Swaziland, MD;  Location: [facility] CATH LAB;  Service: Cardiovascular;  Laterality: N/A;   Current Outpatient Medications  Medication Sig Dispense Refill   aspirin EC 81 MG tablet Take 1 tablet (81 mg total) by mouth daily. 90 tablet 3   empagliflozin (JARDIANCE) 10 MG TABS tablet Take 1 tablet (10 mg total) by mouth daily. 30 tablet 4   metolazone (ZAROXOLYN) 5 MG tablet Take 1 tablet (5 mg total) by mouth once a week (monday) with EXTRA 2 POTASSIUM TABLETS 30 tablet 1   Multiple Vitamins-Minerals (CENTRUM MEN) TABS Take 1 tablet by mouth daily.     potassium chloride SA (KLOR-CON M) 20 MEQ tablet Take 2 tablets (40 mEq total) by mouth every morning AND 1 tablet (20 mEq total) every evening. Take an extra 2 tablets when taking Metolazone. 120 tablet 3   sacubitril-valsartan (ENTRESTO) 97-103 MG Take 1 tablet by mouth 2 (two) times daily. 60 tablet 11   spironolactone (ALDACTONE) 25 MG tablet Take 0.5 tablets (12.5 mg total) by mouth daily. 45 tablet 3  torsemide (DEMADEX) 20 MG tablet Take 20 mg by mouth daily.     No current facility-administered medications for this encounter.   Allergies:   Ace inhibitors   Social History:  The patient  reports that he quit smoking about 46 years ago. His smoking use included cigarettes. He has never used smokeless tobacco. He reports current alcohol use of about 1.0 standard drink of alcohol per week. He reports that he does not use drugs.   Family History:  Father had high blood pressure and diabetes. Mother had no cardiac problems. No other pertinent family history.   Review of systems complete and found to be negative unless  listed in HPI.    BP 102/78 (BP Location: Left Arm, Patient Position: Sitting, Cuff Size: Normal)   Pulse 82   Wt 77.1 kg (170 lb)   SpO2 98%   BMI 23.71 kg/m   Wt Readings from Last 3 Encounters:  06/13/23 77.1 kg (170 lb)  01/14/23 76.2 kg (168 lb)  11/27/22 76 kg (167 lb 8 oz)   Physical Exam: General:  NAD. No resp difficulty, walked into clinic HEENT: normal Neck: supple. JVP 10 Carotids 2+ bilat; no bruits. No lymphadenopathy or thryomegaly appreciated. Cor: PMI nondisplaced. Regular rate & rhythm. No rubs, gallops or murmurs. Lungs: clear Abdomen: soft, nontender, nondistended. No hepatosplenomegaly. No bruits or masses. Good bowel sounds.+ umbilical hernia Extremities: no cyanosis, clubbing, rash, edema Neuro: alert & orientedx3, cranial nerves grossly intact. moves all 4 extremities w/o difficulty. Affect pleasant   REDs: 49%  Assessment & Plan: 1.  Chronic HFrEF :  - Due to NICM. Suspect HTN in nature. EF has been down since 2015.  - LHC (2015) no CAD. - CPX (7/18) with suboptimal effort.  - Echo (6/19): LVEF 20-25%, Grade 1 DD.  - Echo (5/22): EF 20-25%, RV ok - Echo (6/23): EF < 20% RV moderately down Severe MR. Mod-severe TR IVC markedly dilated  - Stable NYHA II. Volume only mildly up on exam but ReDS 49% - Will increase torsemide to 40 daily (was supposed to be taking 80 but only taking 20). Will give metolazone 2.5 and Kcl 40 x 1.  - Continue spiro 12.5 mg daily. - Continue Jardiance 10 mg daily. - Continue Entresto 97/103 mg bid.  - Hold carvedilol for now with potential low output - Pt intolerant to cMRI with claustrophobia.  - nearing end-stage. He is not [place] citizen so does not qualify for advanced therapies due to inability to get insurance coverage - Discussed low salt food choices and limiting fluid intake.   - Labs today  2. HTN  - Stable. - Meds as above  3. Umbilical hernia, pre-op risk stratification - Would be high-risk for GA - Ok to  proceed with moderate risk under local/conscious sedation  4. CKD Stage IIIb - Recent creatinine 1.57, stable.  - Labs today  5. Noncompliance - Stress medication compliance    Arvilla Meres, MD  06/13/23 10:04 AM

## 2023-06-13 NOTE — Patient Instructions (Signed)
Medication Changes:  TAKE METOLAZONE 2.5MG  TODAY WITH POTASSIUM AND THEN RESUME NORMAL DOSING   INCREASE TORSEMIDE TO 40MG  ONCE DAILY   Lab Work:  Labs done today, your results will be available in MyChart, we will contact you for abnormal readings.   Testing/Procedures:  Your physician has requested that you have an echocardiogram AS SCHEDULED IN 2 MONTHS. Echocardiography is a painless test that uses sound waves to create images of your heart. It provides your doctor with information about the size and shape of your heart and how well your heart's chambers and valves are working. You may receive an ultrasound enhancing agent through an IV if needed to better visualize your heart during the echo.This procedure takes approximately one hour. There are no restrictions for this procedure  Follow-Up in: 2 MONTHS WITH APP AS SCHEDULED   At the Advanced Heart Failure Clinic, you and your health needs are our priority. We have a designated team specialized in the treatment of Heart Failure. This Care Team includes your primary Heart Failure Specialized Cardiologist (physician), Advanced Practice Providers (APPs- Physician Assistants and Nurse Practitioners), and Pharmacist who all work together to provide you with the care you need, when you need it.   You may see any of the following providers on your designated Care Team at your next follow up:  Dr. Arvilla Meres Dr. Marca Ancona Dr. Marcos Eke, NP Robbie Lis, [place] [facility] [place], [place] Brynda Peon, NP Karle Plumber, PharmD   Please be sure to bring in all your medications bottles to every appointment.   Need to Contact us:  If you have any questions or concerns before your next appointment please send [place] a message through [place] or call our office at [phone].    TO LEAVE A MESSAGE FOR THE NURSE SELECT OPTION 2, PLEASE LEAVE A MESSAGE INCLUDING: YOUR NAME DATE OF BIRTH CALL BACK  NUMBER REASON FOR CALL**this is important as we prioritize the call backs  YOU WILL RECEIVE A CALL BACK THE SAME DAY AS LONG AS YOU CALL BEFORE 4:00 PM

## 2023-06-20 ENCOUNTER — Other Ambulatory Visit: Payer: Self-pay

## 2023-07-16 DIAGNOSIS — K420 Umbilical hernia with obstruction, without gangrene: Secondary | ICD-10-CM | POA: Diagnosis not present

## 2023-07-22 ENCOUNTER — Other Ambulatory Visit: Payer: Self-pay

## 2023-08-13 NOTE — Progress Notes (Incomplete)
Advanced Heart Failure Clinic Note  Date:  08/15/2023   ID:  ASAR ESKRA, DOB 1956-05-31, MRN [id number]  PCP:  Zettie Cooley, MD (Inactive)  Primary Cardiologist:  Dr. Eden Emms HF Cardiologist: Dr. Gala Romney   HPI: Joel Beck is a 67 y.o. male with NICM, HTN, noncompliance and chronic systolic heart failure diagnosed in 2015.    Admitted 5/22 for A/CHF. Diuresed with IV lasix. Repeat echo EF 20-25%.  HF follow  8/22 was taking lasix and torsemide daily and had stopped his spiro. He was referred to paramedicine to help with medication. Unable to start Paramedicine because he said he had too many appointments.   Follow up 02/22/22 volume overloaded, given 80 mg IV lasix + 40 KCL, ReDs 53%.   Echo 6/23 EF < 20%, RV moderately down, severe MR, mod-severe TR, IVC markedly dilated. Weight up 20 lbs, had been off torsemide x 2 months. Resumed torsemide 40 mg daily, instructed to take metolazone 2.5 mg/40 KCL x 2 days, then weekly thereafter.  Has had multiple office visits with volume overload  Follow up 2/24 Volume up. REDs 42%. Torsemide increased to 80 daily   F/u 7/24, volume overloaded. ReDs 49% 2/2 not taking diuretics as prescribed. Diuretics increased with close f/u scheduled.   Today he returns for AHF follow up with his wife. Overall feeling good. Denies palpitations, CP, dizziness, or PND/Orthopnea. No SOB. Edema in bilateral lower extremities. Appetite good, eats well. Low sodium diet and veggies. No fever or chills. Has not been weighting at home with broken scale. Taking all medications. Denies ETOH, smoking. Goes to the park every other day with his wife, they walk for about an hour. Wears compression socks every now and then.   Cardiac studies: - Echo (8/15): EF 25%  - Echo (7/17): EF 20-25%  - Echo (6/18): EF 20-25% Grade II DD - Echo (6/19): EF 20-25%, Grade I DD - Echo (5/22): EF 20-25%, RV ok. - Echo (6/23): EF < 20%, RV moderately down, severe MR, mod-severe  TR. - Echo today: official read pending and unable to review images  - Cath by Dr [place] 2015 with no CAD Unable to tolerate cMRI due to claustrophobia.   - CPX Test (7/18): Peak VO2: 20.6 (74% predicted peak VO2) VE/VCO2 slope:  28 OUES: 2.55 Peak RER: 0.96 Submax test. Mildly reduced functional capacity. Mild chrontropic incompetence in setting of submax effort. No obvious HF or pulmonary limitation noted.   Review of systems complete and found to be negative unless listed in HPI.   Past Medical History:  Diagnosis Date   CHF (congestive heart failure) (HCC) 07/05/2014   Hypertension Dx 2015   Past Surgical History:  Procedure Laterality Date   LEFT HEART CATHETERIZATION WITH CORONARY ANGIOGRAM N/A 07/05/2014   Procedure: LEFT HEART CATHETERIZATION WITH CORONARY ANGIOGRAM;  Surgeon: Peter M [place], MD;  Location: [facility] CATH LAB;  Service: Cardiovascular;  Laterality: N/A;   Current Outpatient Medications  Medication Sig Dispense Refill   aspirin EC 81 MG tablet Take 1 tablet (81 mg total) by mouth daily. 90 tablet 3   empagliflozin (JARDIANCE) 10 MG TABS tablet Take 1 tablet (10 mg total) by mouth daily. 30 tablet 4   metolazone (ZAROXOLYN) 5 MG tablet Take 1 tablet (5 mg total) by mouth once a week (monday) with EXTRA 2 POTASSIUM TABLETS 30 tablet 1   Multiple Vitamins-Minerals (CENTRUM MEN) TABS Take 1 tablet by mouth daily.     potassium chloride SA (KLOR-CON  M) 20 MEQ tablet Take 2 tablets (40 mEq total) by mouth every morning AND 1 tablet (20 mEq total) every evening. Take an extra 2 tablets when taking Metolazone. 120 tablet 3   sacubitril-valsartan (ENTRESTO) 97-103 MG Take 1 tablet by mouth 2 (two) times daily. 60 tablet 11   spironolactone (ALDACTONE) 25 MG tablet Take 0.5 tablets (12.5 mg total) by mouth daily. 45 tablet 3   torsemide (DEMADEX) 20 MG tablet Take 2 tablets (40 mg total) by mouth daily. 60 tablet 3   No current facility-administered medications for this  encounter.   Allergies:   Ace inhibitors   Social History:  The patient  reports that he quit smoking about 46 years ago. His smoking use included cigarettes. He has never used smokeless tobacco. He reports current alcohol use of about 1.0 standard drink of alcohol per week. He reports that he does not use drugs.   Family History:  Father had high blood pressure and diabetes. Mother had no cardiac problems. No other pertinent family history.   Review of systems complete and found to be negative unless listed in HPI.    BP 122/82   Pulse 60   Wt 74.8 kg (165 lb)   SpO2 97%   BMI 23.01 kg/m   Wt Readings from Last 3 Encounters:  08/15/23 74.8 kg (165 lb)  06/13/23 77.1 kg (170 lb)  01/14/23 76.2 kg (168 lb)   Physical Exam: General:  well appearing.  No respiratory difficulty. Walked into clinic HEENT: normal Neck: supple. JVD ~10 cm. Carotids 2+ bilat; no bruits. No lymphadenopathy or thyromegaly appreciated. Cor: PMI nondisplaced. Regular rate & rhythm. No rubs, gallops or murmurs. Lungs: clear Abdomen: soft, nontender, nondistended. No hepatosplenomegaly. No bruits or masses. Good bowel sounds. Extremities: no cyanosis, clubbing, rash, +1-2 BLE edema  Neuro: alert & oriented x 3, cranial nerves grossly intact. moves all 4 extremities w/o difficulty. Affect pleasant.   REDs: 48%  Assessment & Plan: 1.  Chronic HFrEF :  - Due to NICM. Suspect HTN in nature. EF has been down since 2015.  - LHC (2015) no CAD. - CPX (7/18) with suboptimal effort.  - Echo (6/19): LVEF 20-25%, Grade 1 DD.  - Echo (5/22): EF 20-25%, RV ok - Echo (6/23): EF < 20% RV moderately down Severe MR. Mod-severe TR IVC markedly dilated  - Stable NYHA II. Volume only mildly up on exam but ReDS 48% - Increase torsemide 40>60 mg daily and continue KDUR 60 mEq - Increase spiro 12.5>25 mg daily. (Was taking 2 halves on random days) - Continue Jardiance 10 mg daily. - Continue Entresto 97/103 mg bid.  - Hold  carvedilol for now with potential low output - Pt intolerant to cMRI with claustrophobia.  - nearing end-stage. He is not [place] citizen so does not qualify for advanced therapies due to inability to get insurance coverage - Continue low salt food choices  - BMET/BNP today. Repeat BMET 10 days  2. HTN  - Stable. - Meds as above  3. Umbilical hernia, pre-op risk stratification - Would be high-risk for GA - Ok to proceed with moderate risk under local/conscious sedation  4. CKD Stage IIIb - Recent creatinine 1.49, stable.  - Labs today  5. Noncompliance - Compliance has improved  Follow up in 3-4 weeks with APP to reassess volume.   Alen Bleacher, NP  08/15/23 1:52 PM

## 2023-08-14 ENCOUNTER — Telehealth (HOSPITAL_COMMUNITY): Payer: Self-pay

## 2023-08-14 NOTE — Telephone Encounter (Signed)
Called to confirm/remind patient of their appointment at the Advanced [facility] Clinic on 08/15/23. However, patient's phone number was not in service.

## 2023-08-15 ENCOUNTER — Ambulatory Visit (HOSPITAL_COMMUNITY)
Admission: RE | Admit: 2023-08-15 | Discharge: 2023-08-15 | Disposition: A | Discharge status: Home / Self Care | Payer: Medicare (Managed Care) | Source: Ambulatory Visit | Attending: Internal Medicine | Admitting: Internal Medicine

## 2023-08-15 ENCOUNTER — Other Ambulatory Visit: Payer: Self-pay

## 2023-08-15 ENCOUNTER — Ambulatory Visit (HOSPITAL_BASED_OUTPATIENT_CLINIC_OR_DEPARTMENT_OTHER)
Admission: RE | Admit: 2023-08-15 | Discharge: 2023-08-15 | Disposition: A | Discharge status: Home / Self Care | Payer: Medicare (Managed Care) | Source: Ambulatory Visit | Attending: Internal Medicine | Admitting: Internal Medicine

## 2023-08-15 ENCOUNTER — Encounter (HOSPITAL_COMMUNITY): Payer: Self-pay

## 2023-08-15 VITALS — BP 122/82 | HR 60 | Wt 165.0 lb

## 2023-08-15 DIAGNOSIS — N183 Chronic kidney disease, stage 3 unspecified: Secondary | ICD-10-CM

## 2023-08-15 DIAGNOSIS — I428 Other cardiomyopathies: Secondary | ICD-10-CM | POA: Diagnosis not present

## 2023-08-15 DIAGNOSIS — Z91148 Patient's other noncompliance with medication regimen for other reason: Secondary | ICD-10-CM

## 2023-08-15 DIAGNOSIS — Z91199 Patient's noncompliance with other medical treatment and regimen due to unspecified reason: Secondary | ICD-10-CM | POA: Diagnosis not present

## 2023-08-15 DIAGNOSIS — N1832 Chronic kidney disease, stage 3b: Secondary | ICD-10-CM | POA: Diagnosis not present

## 2023-08-15 DIAGNOSIS — F40240 Claustrophobia: Secondary | ICD-10-CM | POA: Insufficient documentation

## 2023-08-15 DIAGNOSIS — I10 Essential (primary) hypertension: Secondary | ICD-10-CM

## 2023-08-15 DIAGNOSIS — I130 Hypertensive heart and chronic kidney disease with heart failure and stage 1 through stage 4 chronic kidney disease, or unspecified chronic kidney disease: Secondary | ICD-10-CM | POA: Diagnosis not present

## 2023-08-15 DIAGNOSIS — I5022 Chronic systolic (congestive) heart failure: Secondary | ICD-10-CM

## 2023-08-15 DIAGNOSIS — Z7984 Long term (current) use of oral hypoglycemic drugs: Secondary | ICD-10-CM | POA: Diagnosis not present

## 2023-08-15 DIAGNOSIS — K429 Umbilical hernia without obstruction or gangrene: Secondary | ICD-10-CM

## 2023-08-15 LAB — BASIC METABOLIC PANEL
Anion gap: 11 (ref 5–15)
BUN: 28 mg/dL — ABNORMAL HIGH (ref 8–23)
CO2: 29 mmol/L (ref 22–32)
Calcium: 9.5 mg/dL (ref 8.9–10.3)
Chloride: 100 mmol/L (ref 98–111)
Creatinine, Ser: 1.69 mg/dL — ABNORMAL HIGH (ref 0.61–1.24)
GFR, Estimated: 44 mL/min — ABNORMAL LOW (ref >60)
Glucose, Bld: 85 mg/dL (ref 70–99)
Potassium: 4.5 mmol/L (ref 3.5–5.1)
Sodium: 140 mmol/L (ref 135–145)

## 2023-08-15 LAB — ECHOCARDIOGRAM COMPLETE
Area-P 1/2: 4.8 cm2
MV M vel: 4.46 m/s
MV Peak grad: 79.6 mmHg
MV VTI: 0.23 cm2
P 1/2 time: 731 ms
Radius: 0.75 cm
S' Lateral: 6.4 cm

## 2023-08-15 LAB — BRAIN NATRIURETIC PEPTIDE: B Natriuretic Peptide: 4380.5 pg/mL — ABNORMAL HIGH (ref 0.0–100.0)

## 2023-08-15 MED ORDER — TORSEMIDE 20 MG PO TABS
60.0000 mg | ORAL_TABLET | Freq: Every day | ORAL | 3 refills | Status: AC
  Filled 2023-08-15 – 2024-07-16 (×2): qty 90, 30d supply, fill #0

## 2023-08-15 MED ORDER — SPIRONOLACTONE 25 MG PO TABS
25.0000 mg | ORAL_TABLET | Freq: Every day | ORAL | 3 refills | Status: AC
  Filled 2023-08-15 – 2023-09-13 (×2): qty 90, 90d supply, fill #0
  Filled 2024-07-16: qty 90, 90d supply, fill #1

## 2023-08-15 NOTE — Patient Instructions (Addendum)
Medication Changes:  INCREASE: SPIRONOLACTONE TO 25MG  ONCE DAILY- TAKE 1 TABLET  INCREASE TORSEMIDE TO 60MG  ONCE DAILY- TAKE 3 TABLETS DAILY    Lab Work:  Labs done today, your results will be available in MyChart, we will contact you for abnormal readings.  THEN RETURN IN 10 DAYS FOR REPEAT BLOOD WORK AS SCHEDULED   Follow-Up in: 3-4 WEEKS AS SCHEDULED WITH APP   At the Advanced [facility] Failure Clinic, you and your health needs are our priority. We have a designated team specialized in the treatment of Heart Failure. This Care Team includes your primary Heart Failure Specialized Cardiologist (physician), Advanced Practice Providers (APPs- Physician Assistants and Nurse Practitioners), and Pharmacist who all work together to provide you with the care you need, when you need it.   You may see any of the following providers on your designated Care Team at your next follow up:  Dr. Arvilla Meres Dr. Marca Ancona Dr. Marcos Eke, NP Robbie Lis, [place] [facility] [place], [place] Brynda Peon, NP Karle Plumber, PharmD   Please be sure to bring in all your medications bottles to every appointment.   Need to Contact us:  If you have any questions or concerns before your next appointment please send [place] a message through [place] or call our office at [phone].    TO LEAVE A MESSAGE FOR THE NURSE SELECT OPTION 2, PLEASE LEAVE A MESSAGE INCLUDING: YOUR NAME DATE OF BIRTH CALL BACK NUMBER REASON FOR CALL**this is important as we prioritize the call backs  YOU WILL RECEIVE A CALL BACK THE SAME DAY AS LONG AS YOU CALL BEFORE 4:00 PM

## 2023-08-15 NOTE — Progress Notes (Signed)
ReDS Vest / Clip - 08/15/23 1300       ReDS Vest / Clip   Station Marker C    Ruler Value 27    ReDS Value Range High volume overload    ReDS Actual Value 48

## 2023-08-22 ENCOUNTER — Other Ambulatory Visit: Payer: Self-pay

## 2023-08-28 ENCOUNTER — Other Ambulatory Visit (HOSPITAL_COMMUNITY): Payer: Medicare (Managed Care)

## 2023-09-11 ENCOUNTER — Telehealth (HOSPITAL_COMMUNITY): Payer: Self-pay

## 2023-09-11 NOTE — Telephone Encounter (Signed)
Called and was unable to leave patient a voice message because his phone number was invalid to confirm/remind patient of their appointment at the [facility] Clinic on 09/12/23.

## 2023-09-12 ENCOUNTER — Encounter (HOSPITAL_COMMUNITY): Payer: Medicare (Managed Care)

## 2023-09-13 ENCOUNTER — Other Ambulatory Visit: Payer: Self-pay

## 2023-11-04 ENCOUNTER — Other Ambulatory Visit: Payer: Self-pay

## 2023-11-05 ENCOUNTER — Other Ambulatory Visit: Payer: Self-pay

## 2023-11-05 ENCOUNTER — Telehealth (HOSPITAL_COMMUNITY): Payer: Self-pay

## 2023-11-05 ENCOUNTER — Other Ambulatory Visit (HOSPITAL_COMMUNITY): Payer: Self-pay

## 2023-11-05 NOTE — Telephone Encounter (Signed)
Advanced Heart Failure Patient Advocate Encounter  The patient was approved for a [employer] grant that will help cover the cost of Entresto, Jardiance, Spironolactone.  Total amount awarded, $10,000.  Effective: 10/29/2023 - 10/27/2024.  BIN [id number] PCN [id number] Group [id number] ID [id number]  Pharmacy provided with approval and processing information.  Burnell Blanks, CPhT Rx Patient Advocate Phone: [phone]

## 2024-01-06 ENCOUNTER — Other Ambulatory Visit: Payer: Self-pay

## 2024-02-12 ENCOUNTER — Other Ambulatory Visit: Payer: Self-pay

## 2024-02-12 ENCOUNTER — Other Ambulatory Visit (HOSPITAL_COMMUNITY): Payer: Self-pay | Admitting: Family Medicine

## 2024-02-12 MED ORDER — METOLAZONE 5 MG PO TABS
5.0000 mg | ORAL_TABLET | ORAL | 0 refills | Status: DC
  Filled 2024-02-12: qty 10, 70d supply, fill #0

## 2024-02-13 ENCOUNTER — Other Ambulatory Visit: Payer: Self-pay

## 2024-04-06 ENCOUNTER — Other Ambulatory Visit (HOSPITAL_COMMUNITY): Payer: Self-pay | Admitting: Family Medicine

## 2024-04-06 ENCOUNTER — Other Ambulatory Visit: Payer: Self-pay

## 2024-04-06 ENCOUNTER — Telehealth (HOSPITAL_COMMUNITY): Payer: Self-pay

## 2024-04-06 MED ORDER — EMPAGLIFLOZIN 10 MG PO TABS
10.0000 mg | ORAL_TABLET | Freq: Every day | ORAL | 11 refills | Status: AC
  Filled 2024-04-06: qty 30, 30d supply, fill #0
  Filled 2024-07-06: qty 30, 30d supply, fill #1

## 2024-04-07 ENCOUNTER — Encounter (HOSPITAL_COMMUNITY): Payer: Medicare (Managed Care)

## 2024-04-07 ENCOUNTER — Other Ambulatory Visit: Payer: Self-pay

## 2024-04-07 ENCOUNTER — Encounter (HOSPITAL_COMMUNITY): Payer: Self-pay

## 2024-04-07 ENCOUNTER — Ambulatory Visit (HOSPITAL_COMMUNITY)
Admission: RE | Admit: 2024-04-07 | Discharge: 2024-04-07 | Disposition: A | Discharge status: Home / Self Care | Payer: Medicare (Managed Care) | Source: Ambulatory Visit | Attending: Cardiology | Admitting: Cardiology

## 2024-04-07 VITALS — BP 116/76 | HR 74 | Wt 170.4 lb

## 2024-04-07 DIAGNOSIS — N1832 Chronic kidney disease, stage 3b: Secondary | ICD-10-CM | POA: Insufficient documentation

## 2024-04-07 DIAGNOSIS — I5022 Chronic systolic (congestive) heart failure: Secondary | ICD-10-CM

## 2024-04-07 DIAGNOSIS — I428 Other cardiomyopathies: Secondary | ICD-10-CM | POA: Diagnosis not present

## 2024-04-07 DIAGNOSIS — I493 Ventricular premature depolarization: Secondary | ICD-10-CM

## 2024-04-07 DIAGNOSIS — Z7984 Long term (current) use of oral hypoglycemic drugs: Secondary | ICD-10-CM | POA: Diagnosis not present

## 2024-04-07 DIAGNOSIS — Z87891 Personal history of nicotine dependence: Secondary | ICD-10-CM | POA: Insufficient documentation

## 2024-04-07 DIAGNOSIS — I10 Essential (primary) hypertension: Secondary | ICD-10-CM | POA: Diagnosis not present

## 2024-04-07 DIAGNOSIS — Z79899 Other long term (current) drug therapy: Secondary | ICD-10-CM | POA: Diagnosis not present

## 2024-04-07 DIAGNOSIS — F40240 Claustrophobia: Secondary | ICD-10-CM | POA: Diagnosis not present

## 2024-04-07 DIAGNOSIS — N183 Chronic kidney disease, stage 3 unspecified: Secondary | ICD-10-CM

## 2024-04-07 DIAGNOSIS — I130 Hypertensive heart and chronic kidney disease with heart failure and stage 1 through stage 4 chronic kidney disease, or unspecified chronic kidney disease: Secondary | ICD-10-CM | POA: Insufficient documentation

## 2024-04-07 DIAGNOSIS — K429 Umbilical hernia without obstruction or gangrene: Secondary | ICD-10-CM

## 2024-04-07 DIAGNOSIS — Z91199 Patient's noncompliance with other medical treatment and regimen due to unspecified reason: Secondary | ICD-10-CM | POA: Diagnosis not present

## 2024-04-07 LAB — BASIC METABOLIC PANEL WITH GFR
Anion gap: 6 (ref 5–15)
BUN: 24 mg/dL — ABNORMAL HIGH (ref 8–23)
CO2: 26 mmol/L (ref 22–32)
Calcium: 8.9 mg/dL (ref 8.9–10.3)
Chloride: 107 mmol/L (ref 98–111)
Creatinine, Ser: 1.38 mg/dL — ABNORMAL HIGH (ref 0.61–1.24)
GFR, Estimated: 56 mL/min — ABNORMAL LOW (ref >60)
Glucose, Bld: 86 mg/dL (ref 70–99)
Potassium: 4.4 mmol/L (ref 3.5–5.1)
Sodium: 139 mmol/L (ref 135–145)

## 2024-04-07 LAB — BRAIN NATRIURETIC PEPTIDE: B Natriuretic Peptide: 3142.1 pg/mL — ABNORMAL HIGH (ref 0.0–100.0)

## 2024-04-07 MED ORDER — METOLAZONE 5 MG PO TABS
5.0000 mg | ORAL_TABLET | ORAL | 8 refills | Status: AC
  Filled 2024-04-07: qty 8, 56d supply, fill #0
  Filled 2024-05-27 (×2): qty 8, 56d supply, fill #1
  Filled 2024-10-15 – 2024-10-28 (×2): qty 8, 56d supply, fill #2

## 2024-04-07 MED ORDER — POTASSIUM CHLORIDE CRYS ER 20 MEQ PO TBCR
EXTENDED_RELEASE_TABLET | ORAL | 3 refills | Status: AC
  Filled 2024-04-07: qty 120, 40d supply, fill #0
  Filled 2024-07-06: qty 120, 40d supply, fill #1

## 2024-04-07 MED ORDER — ENTRESTO 97-103 MG PO TABS
1.0000 | ORAL_TABLET | Freq: Two times a day (BID) | ORAL | 11 refills | Status: AC
  Filled 2024-04-07: qty 60, 30d supply, fill #0
  Filled 2024-07-06: qty 60, 30d supply, fill #1
  Filled 2024-10-08: qty 60, 30d supply, fill #2
  Filled 2024-12-29: qty 60, 30d supply, fill #3

## 2024-04-07 NOTE — Patient Instructions (Addendum)
 Thank you for coming in today  If you had labs drawn today, any labs that are abnormal the clinic will call you No news is good news  Medications: Take Metolazone  5 mg 1 tablet with extra dose of Potassium 20 meq for the next 3 days  Tuesday 04/07/2024, Wednesday 04/08/2024, Thursday 04/09/2024  Then start next week take Metolazone  5 mg weekly with extra Potassium 20 meq  Follow up appointments:  Your physician recommends that you schedule a follow-up appointment in:  2 weeks  Your physician has requested that you have an echocardiogram. Echocardiography is a painless test that uses sound waves to create images of your heart. It provides your doctor with information about the size and shape of your heart and how well your heart's chambers and valves are working. This procedure takes approximately one hour. There are no restrictions for this procedure.      Do the following things EVERYDAY: Weigh yourself in the morning before breakfast. Write it down and keep it in a log. Take your medicines as prescribed Eat low salt foods--Limit salt (sodium) to 2000 mg per day.  Stay as active as you can everyday Limit all fluids for the day to less than 2 liters   At the [facility], you and your health needs are our priority. As part of our continuing mission to provide you with exceptional heart care, we have created designated Provider Care Teams. These Care Teams include your primary Cardiologist (physician) and Advanced Practice Providers (APPs- Physician Assistants and Nurse Practitioners) who all work together to provide you with the care you need, when you need it.   You may see any of the following providers on your designated Care Team at your next follow up: Dr Jules Oar Dr Peder Bourdon Dr. Mimi Alt, NP Ruddy Corral, [place] [facility] [place], [place] Dennise Fitz, NP Luster Salters, PharmD   Please be sure to bring in all your  medications bottles to every appointment.    Thank you for choosing [facility]  If you have any questions or concerns before your next appointment please send us  a message through [place] or call our office at [phone].    TO LEAVE A MESSAGE FOR THE NURSE SELECT OPTION 2, PLEASE LEAVE A MESSAGE INCLUDING: YOUR NAME DATE OF BIRTH CALL BACK NUMBER REASON FOR CALL**this is important as we prioritize the call backs  YOU WILL RECEIVE A CALL BACK THE SAME DAY AS LONG AS YOU CALL BEFORE 4:00 PM

## 2024-04-07 NOTE — Progress Notes (Signed)
 ReDS Vest / Clip - 04/07/24 1100       ReDS Vest / Clip   Station Marker C    Ruler Value 26    ReDS Value Range High volume overload    ReDS Actual Value 46

## 2024-04-07 NOTE — Progress Notes (Addendum)
 Advanced Heart Failure Clinic Note  Primary Cardiologist:  Dr. Stann Earnest HF Cardiologist: Dr. Julane Ny  Reason for Visit: Heart Failure Follow-up  HPI: Joel Beck is a 68 y.o. male with NICM, HTN, noncompliance and chronic systolic heart failure diagnosed in 2015.    Admitted 5/22 for A/CHF. Diuresed with IV lasix . Repeat echo EF 20-25%.  HF follow  8/22 was taking lasix  and torsemide  daily and had stopped his spiro. He was referred to paramedicine to help with medication. Unable to start Paramedicine because he said he had too many appointments.   Follow up 02/22/22 volume overloaded, given 80 mg IV lasix  + 40 KCL, ReDs 53%.   Echo 6/23 EF < 20%, RV moderately down, severe MR, mod-severe TR, IVC markedly dilated. Weight up 20 lbs, had been off torsemide  x 2 months. Resumed torsemide  40 mg daily, instructed to take metolazone  2.5 mg/40 KCL x 2 days, then weekly thereafter.  Has had multiple office visits with volume overload  Follow up 2/24 Volume up. REDs 42%. Torsemide  increased to 80 daily   F/u 7/24, volume overloaded. ReDs 49% 2/2 not taking diuretics as prescribed. Diuretics increased with close f/u scheduled.   Last seen in HF Clinic 9/24. Doing well.   He returns today for heart failure follow up. Overall feeling okay. NYHA II. Reports dyspnea, orthopnea, and  cough, and abdominal pain related to hernia . Denies chest pain, fatigue, near-syncope, palpitations, dizziness, and abnormal bleeding. Able to perform ADLs. Appetite so-so. No N/V. No edema. Compliant with all medications, although has been out of entresto  and metolazone  for a few doses.   Cardiac studies: - Echo (8/15): EF 25%  - Echo (7/17): EF 20-25%  - Echo (6/18): EF 20-25% Grade II DD - Echo (6/19): EF 20-25%, Grade I DD - Echo (5/22): EF 20-25%, RV ok. - Echo (6/23): EF < 20%, RV moderately down, severe MR, mod-severe TR. - Echo (9/24): official read pending and unable to review images  - Cath by Dr  [place] 2015 with no CAD Unable to tolerate cMRI due to claustrophobia.   - CPX Test (7/18): Peak VO2: 20.6 (74% predicted peak VO2) VE/VCO2 slope:  28 OUES: 2.55 Peak RER: 0.96 Submax test. Mildly reduced functional capacity. Mild chrontropic incompetence in setting of submax effort. No obvious HF or pulmonary limitation noted.   Review of systems complete and found to be negative unless listed in HPI.   Past Medical History:  Diagnosis Date   CHF (congestive heart failure) (HCC) 07/05/2014   Hypertension Dx 2015   Past Surgical History:  Procedure Laterality Date   LEFT HEART CATHETERIZATION WITH CORONARY ANGIOGRAM N/A 07/05/2014   Procedure: LEFT HEART CATHETERIZATION WITH CORONARY ANGIOGRAM;  Surgeon: Peter M [place]il[place], MD;  Location: [facility] CATH LAB;  Service: Cardiovascular;  Laterality: N/A;   Current Outpatient Medications  Medication Sig Dispense Refill   aspirin  EC 81 MG tablet Take 1 tablet (81 mg total) by mouth daily. 90 tablet 3   empagliflozin  (JARDIANCE ) 10 MG TABS tablet Take 1 tablet (10 mg total) by mouth daily. 30 tablet 11   metolazone  (ZAROXOLYN ) 5 MG tablet Take 1 tablet (5 mg total) by mouth once a week (monday) with EXTRA 2 POTASSIUM TABLETS 30 tablet 0   Multiple Vitamins-Minerals (CENTRUM MEN) TABS Take 1 tablet by mouth daily.     potassium chloride  SA (KLOR-CON  M) 20 MEQ tablet Take 2 tablets (40 mEq total) by mouth every morning AND 1 tablet (20 mEq total) every evening.  Take an extra 2 tablets when taking Metolazone . 120 tablet 3   sacubitril -valsartan  (ENTRESTO ) 97-103 MG Take 1 tablet by mouth 2 (two) times daily. 60 tablet 11   spironolactone  (ALDACTONE ) 25 MG tablet Take 1 tablet (25 mg total) by mouth daily. 90 tablet 3   torsemide  (DEMADEX ) 20 MG tablet Take 3 tablets (60 mg total) by mouth daily. 90 tablet 3   No current facility-administered medications for this encounter.   Allergies:   Ace inhibitors   Social History:  The patient  reports that he  quit smoking about 47 years ago. His smoking use included cigarettes. He has never used smokeless tobacco. He reports current alcohol use of about 1.0 standard drink of alcohol per week. He reports that he does not use drugs.   Family History:  Father had high blood pressure and diabetes. Mother had no cardiac problems. No other pertinent family history.   Review of systems complete and found to be negative unless listed in HPI.    BP 116/76   Pulse 74   Wt 77.3 kg (170 lb 6.4 oz)   SpO2 99%   BMI 23.77 kg/m   Wt Readings from Last 3 Encounters:  04/07/24 77.3 kg (170 lb 6.4 oz)  08/15/23 74.8 kg (165 lb)  06/13/23 77.1 kg (170 lb)   Physical Exam: General: Well appearing. No distress on RA. Walked into clinic Cardiac: JVP ~10cm. S1 and S2 present. No murmurs or rub. Resp: Lung sounds clear and equal B/L Extremities: Warm and dry.  No peripheral edema.  Neuro: Alert and oriented x3. Affect pleasant. Moves all extremities without difficulty.  ECG (personally reviewed): NSR 76 bpm with frequent PVCs  ReDs reading: 46 %, abnormal  Assessment & Plan: 1.  Chronic HFrEF :  - Due to NICM. Suspect HTN in nature. EF has been down since 2015.  - LHC (2015) no CAD. - CPX (7/18) with suboptimal effort. - EF down to 20-25% since 2018. - Echo (9/24): EF 20-25%, RV mildly reduced. BAE, mod/sev MR, sev TR - Stable NYHA II. Significantly volume up by exam/symptoms and ReDs. BNP/BMET - Continue Torsemide  60 mg daily and continue KCL 40/20 mEq.  - Take Metolazone  5 mg x3 days + extra 20 mEq KCL. Then resume weekly dosing - Continue spiro 25 mg daily.  - Continue Jardiance  10 mg daily. - Continue Entresto  97/103 mg bid. Has been out, will refill and pick up today. - Pt intolerant to cMRI with claustrophobia.  - Need to update echo, if EF remains <35% will need EP referral for ICD. Scheduled for next available OP Echo.  2. HTN  - BP stable today. - Meds as above  3. Umbilical hernia,  pre-op risk stratification - Would be high-risk for GA - Ok to proceed with moderate risk under local/conscious sedation  4. CKD Stage IIIb - Recent creatinine up to 1.69 - BMET today  5. Frequent PVCs - in the setting of volume overload - noted on ECG today. BMET - repeat ECG at next visit, if not improved with diuresis would place Zio monitor - ultimately needs ICD with EF <35% since 2018. Repeat echo pending.   6. Noncompliance - intermittently continues to be an issue - Eligible for Paramedicine now, would address at next visit. - Would benefit from an interpreter at follow up visits, prev has refused  Follow up in 2 weeks with APP to reassess volume, labs, and ECG (PVCs).  Follow up in 2 months with Dr. Julane Ny +  update echo  [place] Augustina Braddock, NP  04/07/24

## 2024-04-08 ENCOUNTER — Other Ambulatory Visit: Payer: Self-pay

## 2024-04-10 ENCOUNTER — Encounter (HOSPITAL_COMMUNITY): Payer: Medicare (Managed Care)

## 2024-04-21 ENCOUNTER — Telehealth (HOSPITAL_COMMUNITY): Payer: Self-pay

## 2024-04-21 NOTE — Telephone Encounter (Addendum)
 Called to confirm/remind patient of their appointment at the Advanced [facility] Clinic on 04/22/24.   Appointment:   [] Confirmed  [] Left mess   [] No answer/No voice mail  [] VM Full/unable to leave message  [x] Phone not in service

## 2024-04-22 ENCOUNTER — Encounter (HOSPITAL_COMMUNITY): Payer: Medicare (Managed Care)

## 2024-05-07 DIAGNOSIS — I130 Hypertensive heart and chronic kidney disease with heart failure and stage 1 through stage 4 chronic kidney disease, or unspecified chronic kidney disease: Secondary | ICD-10-CM | POA: Diagnosis not present

## 2024-05-07 DIAGNOSIS — K429 Umbilical hernia without obstruction or gangrene: Secondary | ICD-10-CM | POA: Diagnosis not present

## 2024-05-07 DIAGNOSIS — N189 Chronic kidney disease, unspecified: Secondary | ICD-10-CM | POA: Diagnosis not present

## 2024-05-07 DIAGNOSIS — I509 Heart failure, unspecified: Secondary | ICD-10-CM | POA: Diagnosis not present

## 2024-05-14 NOTE — Telephone Encounter (Signed)
 Opened in error

## 2024-05-27 ENCOUNTER — Other Ambulatory Visit: Payer: Self-pay

## 2024-05-27 ENCOUNTER — Ambulatory Visit (HOSPITAL_COMMUNITY)
Admission: RE | Admit: 2024-05-27 | Discharge: 2024-05-27 | Disposition: A | Discharge status: Home / Self Care | Payer: Medicare (Managed Care) | Source: Ambulatory Visit | Attending: Cardiology | Admitting: Cardiology

## 2024-05-27 DIAGNOSIS — I5022 Chronic systolic (congestive) heart failure: Secondary | ICD-10-CM | POA: Insufficient documentation

## 2024-05-27 DIAGNOSIS — I110 Hypertensive heart disease with heart failure: Secondary | ICD-10-CM | POA: Diagnosis not present

## 2024-05-27 DIAGNOSIS — I080 Rheumatic disorders of both mitral and aortic valves: Secondary | ICD-10-CM | POA: Insufficient documentation

## 2024-05-27 LAB — ECHOCARDIOGRAM COMPLETE
AR max vel: 3.27 cm2
AV Area VTI: 4.56 cm2
AV Area mean vel: 3.13 cm2
AV Mean grad: 2 mmHg
AV Peak grad: 2.7 mmHg
Ao pk vel: 0.81 m/s
Area-P 1/2: 5.88 cm2
Est EF: <20
MV VTI: 2.12 cm2
S' Lateral: 6.1 cm

## 2024-05-27 NOTE — Progress Notes (Incomplete)
 Advanced Heart Failure Clinic Note  Primary Cardiologist:  Dr. Delford HF Cardiologist: Dr. Cherrie Reason for Visit: Heart Failure Follow-up   HPI: Joel Beck is a 68 y.o. male with NICM, HTN, noncompliance and chronic systolic heart failure diagnosed in 2015.    Admitted 5/22 for A/CHF. Diuresed with IV lasix . Repeat echo EF 20-25%.  HF follow  8/22 was taking lasix  and torsemide  daily and had stopped his spiro. He was referred to paramedicine to help with medication. Unable to start Paramedicine because he said he had too many appointments.   Follow up 02/22/22 volume overloaded, given 80 mg IV lasix  + 40 KCL, ReDs 53%.   Echo 6/23 EF < 20%, RV moderately down, severe MR, mod-severe TR, IVC markedly dilated. Weight up 20 lbs, had been off torsemide  x 2 months. Resumed torsemide  40 mg daily, instructed to take metolazone  2.5 mg/40 KCL x 2 days, then weekly thereafter.  Has had multiple office visits with volume overload  Follow up 2/24 Volume up. REDs 42%. Torsemide  increased to 80 daily   F/u 7/24, volume overloaded. ReDs 49% 2/2 not taking diuretics as prescribed. Diuretics increased with close f/u scheduled.   Last seen in HF Clinic 9/24. Doing well.   5/25 for heart failure follow up. Overall feeling okay. NYHA II. Reports dyspnea, orthopnea, and  cough, and abdominal pain related to hernia. Denies chest pain, fatigue, near-syncope, palpitations, dizziness, and abnormal bleeding. Able to perform ADLs. Appetite so-so. No N/V. No edema. Compliant with all medications, although has been out of entresto  and metolazone  for a few doses.   He returns today for heart failure follow up. Overall feeling ***. NYHA ***. Reports {Symptoms; cardiac:12860::dyspnea,fatigue}. Denies {Symptoms; cardiac:12860::chest pain,dyspnea,fatigue,near-syncope,orthopnea,palpitations,dizziness,abnormal bleeding}. Able to perform ADLs. Appetite okay. Weight at home ***. BP at home***.  Compliant with all medications.    Cardiac studies: - Echo (8/15): EF 25%  - Echo (7/17): EF 20-25%  - Echo (6/18): EF 20-25% Grade II DD - Echo (6/19): EF 20-25%, Grade I DD - Echo (5/22): EF 20-25%, RV ok. - Echo (6/23): EF < 20%, RV moderately down, severe MR, mod-severe TR. - Echo (9/24): official read pending and unable to review images - Cath (2015) with no CAD - CPX Test (7/18): Peak VO2: 20.6 (74% predicted peak VO2), VE/VCO2 slope: 28, OUES: 2.55, Peak RER: 0.96. Submax test. Mildly reduced functional capacity. Mild chrontropic incompetence in setting of submax effort. No obvious HF or pulmonary limitation noted.   Review of systems complete and found to be negative unless listed in HPI.   Past Medical History:  Diagnosis Date   CHF (congestive heart failure) (HCC) 07/05/2014   Hypertension Dx 2015   Past Surgical History:  Procedure Laterality Date   LEFT HEART CATHETERIZATION WITH CORONARY ANGIOGRAM N/A 07/05/2014   Procedure: LEFT HEART CATHETERIZATION WITH CORONARY ANGIOGRAM;  Surgeon: Peter M [place], MD;  Location: [facility] CATH LAB;  Service: Cardiovascular;  Laterality: N/A;   Current Outpatient Medications  Medication Sig Dispense Refill   aspirin  EC 81 MG tablet Take 1 tablet (81 mg total) by mouth daily. 90 tablet 3   empagliflozin  (JARDIANCE ) 10 MG TABS tablet Take 1 tablet (10 mg total) by mouth daily. 30 tablet 11   metolazone  (ZAROXOLYN ) 5 MG tablet Take 1 tablet (5 mg total) by mouth once a week. Once weekly every Tuesday 8 tablet 8   Multiple Vitamins-Minerals (CENTRUM MEN) TABS Take 1 tablet by mouth daily.     potassium chloride  SA (KLOR-CON   M) 20 MEQ tablet Take 2 tablets (40 mEq total) by mouth every morning AND 1 tablet (20 mEq total) every evening. Take an extra 2 tablets when taking Metolazone . 120 tablet 3   sacubitril -valsartan  (ENTRESTO ) 97-103 MG Take 1 tablet by mouth 2 (two) times daily. 60 tablet 11   spironolactone  (ALDACTONE ) 25 MG tablet Take 1  tablet (25 mg total) by mouth daily. 90 tablet 3   torsemide  (DEMADEX ) 20 MG tablet Take 3 tablets (60 mg total) by mouth daily. 90 tablet 3   No current facility-administered medications for this visit.   Allergies:   Ace inhibitors   Social History:  The patient  reports that he quit smoking about 47 years ago. His smoking use included cigarettes. He has never used smokeless tobacco. He reports current alcohol use of about 1.0 standard drink of alcohol per week. He reports that he does not use drugs.   Family History:  Father had high blood pressure and diabetes. Mother had no cardiac problems. No other pertinent family history.   Review of systems complete and found to be negative unless listed in HPI.    There were no vitals taken for this visit.  Wt Readings from Last 3 Encounters:  04/07/24 77.3 kg (170 lb 6.4 oz)  08/15/23 74.8 kg (165 lb)  06/13/23 77.1 kg (170 lb)   Physical Exam: General: Well appearing. No distress on RA. Walked into clinic Cardiac: JVP ~10cm. S1 and S2 present. No murmurs or rub. Resp: Lung sounds clear and equal B/L Extremities: Warm and dry.  No peripheral edema.  Neuro: Alert and oriented x3. Affect pleasant. Moves all extremities without difficulty.  ECG (personally reviewed): NSR 76 bpm with frequent PVCs  ReDs reading: 46 %, abnormal ***  Assessment & Plan: 1.  Chronic HFrEF :  - Due to NICM. Suspect HTN in nature. EF has been down since 2015.  - LHC (2015) no CAD. - CPX (7/18) with suboptimal effort. - EF down to 20-25% since 2018. - Echo (9/24): EF 20-25%, RV mildly reduced. BAE, mod/sev MR, sev TR - Stable NYHA II. Significantly volume up by exam/symptoms and ReDs. BNP/BMET - Continue Torsemide  60 mg daily and continue KCL 40/20 mEq.  - Take Metolazone  5 mg x3 days + extra 20 mEq KCL. Then resume weekly dosing - Continue spiro 25 mg daily.  - Continue Jardiance  10 mg daily. - Continue Entresto  97/103 mg bid. Has been out, will refill  and pick up today. - Pt intolerant to cMRI with claustrophobia.  - Need to update echo, if EF remains <35% will need EP referral for ICD. Scheduled for next available OP Echo.  2. HTN  - BP stable today. - Meds as above  3. Umbilical hernia, pre-op risk stratification - Would be high-risk for GA - Ok to proceed with moderate risk under local/conscious sedation  4. CKD Stage IIIb - Recent creatinine up to 1.69 - BMET today  5. Frequent PVCs - in the setting of volume overload - noted on ECG today. BMET - repeat ECG at next visit, if not improved with diuresis would place Zio monitor - ultimately needs ICD with EF <35% since 2018. Repeat echo pending.   6. Noncompliance - intermittently continues to be an issue - Eligible for Paramedicine now, would address at next visit. - Would benefit from an interpreter at follow up visits, prev has refused  Follow up in 2 weeks with APP to reassess volume, labs, and ECG (PVCs).  Follow up  in 2 months with Dr. Cherrie + update echo  [place] Livia Tarr, NP  05/27/24

## 2024-05-28 ENCOUNTER — Other Ambulatory Visit: Payer: Self-pay

## 2024-05-28 ENCOUNTER — Inpatient Hospital Stay (HOSPITAL_COMMUNITY)
Admission: RE | Admit: 2024-05-28 | Discharge: 2024-05-28 | Disposition: A | Discharge status: Home / Self Care | Payer: Medicare (Managed Care) | Source: Ambulatory Visit | Attending: Internal Medicine | Admitting: Internal Medicine

## 2024-05-28 ENCOUNTER — Ambulatory Visit (HOSPITAL_COMMUNITY): Payer: Self-pay | Admitting: Cardiology

## 2024-05-28 ENCOUNTER — Ambulatory Visit (HOSPITAL_COMMUNITY)
Admission: RE | Admit: 2024-05-28 | Discharge: 2024-05-28 | Disposition: A | Discharge status: Home / Self Care | Payer: Medicare (Managed Care) | Source: Ambulatory Visit | Attending: Cardiology | Admitting: Cardiology

## 2024-05-28 ENCOUNTER — Encounter (HOSPITAL_COMMUNITY): Payer: Self-pay

## 2024-05-28 ENCOUNTER — Other Ambulatory Visit (HOSPITAL_COMMUNITY): Payer: Self-pay | Admitting: Internal Medicine

## 2024-05-28 VITALS — BP 118/78 | HR 75 | Ht 71.0 in | Wt 167.4 lb

## 2024-05-28 DIAGNOSIS — Z79899 Other long term (current) drug therapy: Secondary | ICD-10-CM | POA: Diagnosis not present

## 2024-05-28 DIAGNOSIS — R0609 Other forms of dyspnea: Secondary | ICD-10-CM | POA: Diagnosis not present

## 2024-05-28 DIAGNOSIS — I5022 Chronic systolic (congestive) heart failure: Secondary | ICD-10-CM | POA: Diagnosis not present

## 2024-05-28 DIAGNOSIS — Z87891 Personal history of nicotine dependence: Secondary | ICD-10-CM | POA: Diagnosis not present

## 2024-05-28 DIAGNOSIS — I10 Essential (primary) hypertension: Secondary | ICD-10-CM | POA: Diagnosis not present

## 2024-05-28 DIAGNOSIS — F40240 Claustrophobia: Secondary | ICD-10-CM | POA: Insufficient documentation

## 2024-05-28 DIAGNOSIS — N1832 Chronic kidney disease, stage 3b: Secondary | ICD-10-CM | POA: Diagnosis not present

## 2024-05-28 DIAGNOSIS — I493 Ventricular premature depolarization: Secondary | ICD-10-CM

## 2024-05-28 DIAGNOSIS — I428 Other cardiomyopathies: Secondary | ICD-10-CM | POA: Insufficient documentation

## 2024-05-28 DIAGNOSIS — Z91199 Patient's noncompliance with other medical treatment and regimen due to unspecified reason: Secondary | ICD-10-CM | POA: Insufficient documentation

## 2024-05-28 DIAGNOSIS — Z7984 Long term (current) use of oral hypoglycemic drugs: Secondary | ICD-10-CM | POA: Diagnosis not present

## 2024-05-28 DIAGNOSIS — I130 Hypertensive heart and chronic kidney disease with heart failure and stage 1 through stage 4 chronic kidney disease, or unspecified chronic kidney disease: Secondary | ICD-10-CM | POA: Diagnosis not present

## 2024-05-28 LAB — BASIC METABOLIC PANEL WITH GFR
Anion gap: 7 (ref 5–15)
BUN: 17 mg/dL (ref 8–23)
CO2: 29 mmol/L (ref 22–32)
Calcium: 9.9 mg/dL (ref 8.9–10.3)
Chloride: 102 mmol/L (ref 98–111)
Creatinine, Ser: 1.5 mg/dL — ABNORMAL HIGH (ref 0.61–1.24)
GFR, Estimated: 51 mL/min — ABNORMAL LOW (ref >60)
Glucose, Bld: 88 mg/dL (ref 70–99)
Potassium: 3.4 mmol/L — ABNORMAL LOW (ref 3.5–5.1)
Sodium: 138 mmol/L (ref 135–145)

## 2024-05-28 LAB — CBC
HCT: 48.9 % (ref 39.0–52.0)
Hemoglobin: 16.8 g/dL (ref 13.0–17.0)
MCH: 32.2 pg (ref 26.0–34.0)
MCHC: 34.4 g/dL (ref 30.0–36.0)
MCV: 93.7 fL (ref 80.0–100.0)
Platelets: 176 10*3/uL (ref 150–400)
RBC: 5.22 MIL/uL (ref 4.22–5.81)
RDW: 12.5 % (ref 11.5–15.5)
WBC: 5.8 10*3/uL (ref 4.0–10.5)
nRBC: 0 % (ref 0.0–0.2)

## 2024-05-28 LAB — BRAIN NATRIURETIC PEPTIDE: B Natriuretic Peptide: 1234.6 pg/mL — ABNORMAL HIGH (ref 0.0–100.0)

## 2024-05-28 NOTE — Patient Instructions (Addendum)
 Thank you for coming in today  If you had labs drawn today, any labs that are abnormal the clinic will call you No news is good news  Your provider has recommended that  you wear a Zio Patch for 14 days.  This monitor will record your heart rhythm for our review.  IF you have any symptoms while wearing the monitor please press the button.  If you have any issues with the patch or you notice a red or orange light on it please call the company at [phone].  Once you remove the patch please mail it back to the company as soon as possible so we can get the results.   You have been referred to EP clinic, their office will call you for further appointment details   Cardiac MRI was ordered for you and you will be called to schedule this appointment  Medications: no changes    Follow up appointments:  Your physician recommends that you schedule a follow-up appointment in:  3 months With Dr. Bensimhon    Do the following things EVERYDAY: Weigh yourself in the morning before breakfast. Write it down and keep it in a log. Take your medicines as prescribed Eat low salt foods--Limit salt (sodium) to 2000 mg per day.  Stay as active as you can everyday Limit all fluids for the day to less than 2 liters   At the Advanced Heart Failure Clinic, you and your health needs are our priority. As part of our continuing mission to provide you with exceptional heart care, we have created designated Provider Care Teams. These Care Teams include your primary Cardiologist (physician) and Advanced Practice Providers (APPs- Physician Assistants and Nurse Practitioners) who all work together to provide you with the care you need, when you need it.   You may see any of the following providers on your designated Care Team at your next follow up: Dr Toribio Fuel Dr Ezra Shuck Dr. Ria Gardenia Greig Lenetta, NP Caffie Shed, [place] [facility] [place], [place] Beckey Coe, NP Tinnie Redman,  PharmD   Please be sure to bring in all your medications bottles to every appointment.    Thank you for choosing [facility]  If you have any questions or concerns before your next appointment please send us  a message through [place] or call our office at [phone].    TO LEAVE A MESSAGE FOR THE NURSE SELECT OPTION 2, PLEASE LEAVE A MESSAGE INCLUDING: YOUR NAME DATE OF BIRTH CALL BACK NUMBER REASON FOR CALL**this is important as we prioritize the call backs  YOU WILL RECEIVE A CALL BACK THE SAME DAY AS LONG AS YOU CALL BEFORE 4:00 PM

## 2024-05-29 NOTE — Telephone Encounter (Signed)
 Attempted to call patient with lab results and medication instructions; both phones are not in service at this time. Letter sent with this information.

## 2024-06-17 DIAGNOSIS — I493 Ventricular premature depolarization: Secondary | ICD-10-CM | POA: Diagnosis not present

## 2024-06-23 NOTE — Addendum Note (Signed)
 Encounter addended by: Debarah Garrison MATSU, RN on: 06/23/2024 8:53 AM  Actions taken: Imaging Exam ended

## 2024-07-06 ENCOUNTER — Other Ambulatory Visit: Payer: Self-pay

## 2024-07-16 ENCOUNTER — Encounter: Payer: Self-pay | Admitting: Cardiology

## 2024-07-16 ENCOUNTER — Other Ambulatory Visit: Payer: Self-pay

## 2024-07-16 ENCOUNTER — Ambulatory Visit: Payer: Medicare (Managed Care) | Attending: Cardiology | Admitting: Cardiology

## 2024-07-16 VITALS — BP 110/74 | HR 68 | Ht 71.0 in | Wt 168.0 lb

## 2024-07-16 DIAGNOSIS — I5022 Chronic systolic (congestive) heart failure: Secondary | ICD-10-CM

## 2024-07-16 DIAGNOSIS — I493 Ventricular premature depolarization: Secondary | ICD-10-CM | POA: Diagnosis not present

## 2024-07-16 NOTE — Patient Instructions (Signed)
 Medication Instructions:  Your physician recommends that you continue on your current medications as directed. Please refer to the Current Medication list given to you today.  *If you need a refill on your cardiac medications before your next appointment, please call your pharmacy*  Follow-Up: At [facility], you and your health needs are our priority.  As part of our continuing mission to provide you with exceptional heart care, our providers are all part of one team.  This team includes your primary Cardiologist (physician) and Advanced Practice Providers or APPs (Physician Assistants and Nurse Practitioners) who all work together to provide you with the care you need, when you need it.  Your next appointment:   As needed with Dr. Cindie

## 2024-07-16 NOTE — Progress Notes (Signed)
 Electrophysiology Office Note:    Date:  07/16/2024   ID:  Joel Beck, DOB 04-Nov-1956, MRN [id number]  [facility] Cardiologist:  None  [facility] Electrophysiologist:  Joel ONEIDA HOLTS, MD   Referring MD: Lee, [place], NP   Chief Complaint: Chronic systolic heart failure  History of Present Illness:    Joel Beck is a 68 year old man who I am seeing today for an evaluation of chronic systolic heart failure at the request of Dr. Cherrie.  The patient has a history of nonischemic cardiomyopathy, hypertension.  He was last seen in the heart failure clinic on May 28, 2024.  His EF was last measured at less than 20%.  His LV severely dilated.  He is Spanish-speaking.  Noncompliance has complicated his management historically.  A pharmacy report showed that he has not filled his medication since September 2024.  He is with his wife today in clinic.  A in person Spanish interpreter provided assistance for today's visit.  We had a long discussion about his medication compliance.  He tells me that he takes his medications reliably although when we reviewed his pharmacy records, many of his medications have 50% or less covered days.  He also reports feeling well.  He is very hesitant to think about any sort of implanted device.    Their past medical, social and family history was reviewed.   ROS:   Please see the history of present illness.    All other systems reviewed and are negative.  EKGs/Labs/Other Studies Reviewed:    The following studies were reviewed today:  June 17, 2024 ZIO monitor Heart rate 45-1 94, average 70 Occasional supraventricular ectopy, 3.4% Frequent ventricular ectopies, 6.3% No atrial fibrillation  May 27, 2024 echo EF less than 20 RV moderately reduced Dilated atria Severe MR  Personally reviewed pharmacy records today.  He is never picked up torsemide  or spironolactone .  Less than 50% coverage days with Entresto .      Physical Exam:     VS:  BP 110/74 (BP Location: Left Arm, Patient Position: Sitting, Cuff Size: Normal)   Pulse 68   Ht 5' 11 (1.803 m)   Wt 168 lb (76.2 kg)   SpO2 98%   BMI 23.43 kg/m     Wt Readings from Last 3 Encounters:  07/16/24 168 lb (76.2 kg)  05/28/24 167 lb 6.4 oz (75.9 kg)  04/07/24 170 lb 6.4 oz (77.3 kg)     GEN: no distress CARD: RRR, No MRG RESP: No IWOB. CTAB.        ASSESSMENT AND PLAN:    1. Chronic systolic CHF (congestive heart failure) (HCC)   2. PVC's (premature ventricular contractions)     #Chronic systolic heart failure #Nonischemic cardiomyopathy NYHA class II.  Warm and dry on exam.  Follows with the heart failure clinic.  Noncompliance has complicated his management.  He is prescribed torsemide , spironolactone , Entresto , metolazone , Jardiance  but it is not completely clear to me whether or not he is taking.  I discussed his risk for sudden cardiac death during today's clinic appointment.  He recently picked up most of his heart failure medications.  He appears to have very little insight into his illness.  If he is able to document adherence to his medication regimen and continues to have a persistently reduced ejection fraction, I think he would benefit from a defibrillator.  Unfortunately, I think his lack of insight is complicating his management.  I would recommend continued follow-up with the heart  failure clinic.  If medication regimen compliance can be documented would recommend defibrillator implant in future.   Signed, Joel Beck. Cindie, MD, [facility], [facility] 07/16/2024 11:35 AM    Electrophysiology [facility]

## 2024-08-27 ENCOUNTER — Other Ambulatory Visit: Payer: Self-pay

## 2024-08-27 ENCOUNTER — Ambulatory Visit (HOSPITAL_COMMUNITY)
Admission: RE | Admit: 2024-08-27 | Discharge: 2024-08-27 | Disposition: A | Discharge status: Home / Self Care | Payer: Medicare (Managed Care) | Source: Ambulatory Visit | Attending: Internal Medicine | Admitting: Internal Medicine

## 2024-08-27 VITALS — BP 140/80 | HR 64 | Wt 173.4 lb

## 2024-08-27 DIAGNOSIS — Z79899 Other long term (current) drug therapy: Secondary | ICD-10-CM | POA: Insufficient documentation

## 2024-08-27 DIAGNOSIS — Z91199 Patient's noncompliance with other medical treatment and regimen due to unspecified reason: Secondary | ICD-10-CM | POA: Diagnosis not present

## 2024-08-27 DIAGNOSIS — N1832 Chronic kidney disease, stage 3b: Secondary | ICD-10-CM | POA: Diagnosis not present

## 2024-08-27 DIAGNOSIS — F40240 Claustrophobia: Secondary | ICD-10-CM | POA: Insufficient documentation

## 2024-08-27 DIAGNOSIS — I428 Other cardiomyopathies: Secondary | ICD-10-CM | POA: Diagnosis not present

## 2024-08-27 DIAGNOSIS — I10 Essential (primary) hypertension: Secondary | ICD-10-CM

## 2024-08-27 DIAGNOSIS — Z8249 Family history of ischemic heart disease and other diseases of the circulatory system: Secondary | ICD-10-CM | POA: Insufficient documentation

## 2024-08-27 DIAGNOSIS — I493 Ventricular premature depolarization: Secondary | ICD-10-CM | POA: Diagnosis not present

## 2024-08-27 DIAGNOSIS — I5022 Chronic systolic (congestive) heart failure: Secondary | ICD-10-CM

## 2024-08-27 DIAGNOSIS — Z603 Acculturation difficulty: Secondary | ICD-10-CM | POA: Insufficient documentation

## 2024-08-27 DIAGNOSIS — I130 Hypertensive heart and chronic kidney disease with heart failure and stage 1 through stage 4 chronic kidney disease, or unspecified chronic kidney disease: Secondary | ICD-10-CM | POA: Diagnosis not present

## 2024-08-27 DIAGNOSIS — Z87891 Personal history of nicotine dependence: Secondary | ICD-10-CM | POA: Diagnosis not present

## 2024-08-27 LAB — BASIC METABOLIC PANEL WITH GFR
Anion gap: 6 (ref 5–15)
BUN: 20 mg/dL (ref 8–23)
CO2: 28 mmol/L (ref 22–32)
Calcium: 9.2 mg/dL (ref 8.9–10.3)
Chloride: 104 mmol/L (ref 98–111)
Creatinine, Ser: 1.14 mg/dL (ref 0.61–1.24)
GFR, Estimated: >60 mL/min (ref >60)
Glucose, Bld: 85 mg/dL (ref 70–99)
Potassium: 5 mmol/L (ref 3.5–5.1)
Sodium: 138 mmol/L (ref 135–145)

## 2024-08-27 LAB — BRAIN NATRIURETIC PEPTIDE: B Natriuretic Peptide: 982.2 pg/mL — ABNORMAL HIGH (ref 0.0–100.0)

## 2024-08-27 MED ORDER — AMLODIPINE BESYLATE 5 MG PO TABS
5.0000 mg | ORAL_TABLET | Freq: Every day | ORAL | 3 refills | Status: AC
  Filled 2024-08-27: qty 90, 90d supply, fill #0

## 2024-08-27 NOTE — Progress Notes (Signed)
 Advanced Heart Failure Clinic  Primary Cardiologist:  Dr. Delford HF Cardiologist: Dr. Cherrie  HPI: Joel Beck is a 68 y.o. male with NICM, HTN, noncompliance and chronic systolic heart failure diagnosed in 2015.    Admitted 5/22 for A/CHF. Diuresed with IV lasix . Repeat echo EF 20-25%.  HF follow  8/22 was taking lasix  and torsemide  daily and had stopped his spiro. He was referred to paramedicine to help with medication. Unable to start Paramedicine because he said he had too many appointments.   Follow up 02/22/22 volume overloaded, given 80 mg IV lasix  + 40 KCL, ReDs 53%.   Echo 6/23 EF < 20%, RV moderately down, severe MR, mod-severe TR, IVC markedly dilated. Weight up 20 lbs, had been off torsemide  x 2 months. Resumed torsemide  40 mg daily, instructed to take metolazone  2.5 mg/40 KCL x 2 days, then weekly thereafter.  Has had multiple office visits with volume overload  Follow up 2/24 Volume up. REDs 42%. Torsemide  increased to 80 daily   F/u 7/24, volume overloaded. ReDs 49% 2/2 not taking diuretics as prescribed. Diuretics increased with close f/u scheduled.   F/u 5/25, volume up. ReDs 46%. Diuretics increased, however Torsemide  has never been filled.  Echo 6/25 EF <20%, sev dilated LV with trabeculae, G3DD,mod RV dysfunction, severe MR/TR  Follow up with EP 8/25, Not a candidate for ICD until he can demonstrate medication compliance.  Today he returns for HF follow up with interpretor. Overall feeling fine. No undue SOB, lifting weights for exercise. Denies increasing SOB, palpitations, abnormal bleeding, CP, dizziness, edema, or PND/Orthopnea. Appetite ok. Weight at home 170 pounds. Taking all medications, pharmacy dispense report suggests otherwise.    Cardiac studies: - Echo 6/25: EF < 20%, G3DD, RV moderately reduced  - Echo (9/24): EF 20-25%, RV mildly reduced  - Echo (6/23): EF < 20%, RV moderately down, severe MR, mod-severe TR.  - Echo (5/22): EF 20-25%,  RV ok.  - Echo (6/19): EF 20-25%, Grade I DD  - Echo (6/18): EF 20-25% Grade II DD  - Echo (7/17): EF 20-25%   - Cath (2015) with no CAD  - Echo (8/15): EF 25%   - CPX Test (7/18): Peak VO2: 20.6 (74% predicted peak VO2), VE/VCO2 slope: 28, OUES: 2.55, Peak RER: 0.96. Submax test. Mildly reduced functional capacity. Mild chrontropic incompetence in setting of submax effort. No obvious HF or pulmonary limitation noted.   Review of systems complete and found to be negative unless listed in HPI.   Past Medical History:  Diagnosis Date   CHF (congestive heart failure) (HCC) 07/05/2014   Hypertension Dx 2015   Past Surgical History:  Procedure Laterality Date   LEFT HEART CATHETERIZATION WITH CORONARY ANGIOGRAM N/A 07/05/2014   Procedure: LEFT HEART CATHETERIZATION WITH CORONARY ANGIOGRAM;  Surgeon: Peter M [place], MD;  Location: [facility] CATH LAB;  Service: Cardiovascular;  Laterality: N/A;   Current Outpatient Medications  Medication Sig Dispense Refill   empagliflozin  (JARDIANCE ) 10 MG TABS tablet Take 1 tablet (10 mg total) by mouth daily. 30 tablet 11   metolazone  (ZAROXOLYN ) 5 MG tablet Take 1 tablet (5 mg total) by mouth once a week. Once weekly every Tuesday 8 tablet 8   Multiple Vitamins-Minerals (CENTRUM MEN) TABS Take 1 tablet by mouth daily.     potassium chloride  SA (KLOR-CON  M) 20 MEQ tablet Take 2 tablets (40 mEq total) by mouth every morning AND 1 tablet (20 mEq total) every evening. Take an extra 2 tablets when  taking Metolazone . 120 tablet 3   sacubitril -valsartan  (ENTRESTO ) 97-103 MG Take 1 tablet by mouth 2 (two) times daily. 60 tablet 11   spironolactone  (ALDACTONE ) 25 MG tablet Take 1 tablet (25 mg total) by mouth daily. 90 tablet 3   torsemide  (DEMADEX ) 20 MG tablet Take 3 tablets (60 mg total) by mouth daily. 90 tablet 3   aspirin  EC 81 MG tablet Take 1 tablet (81 mg total) by mouth daily. (Patient not taking: Reported on 08/27/2024) 90 tablet 3   No current  facility-administered medications for this encounter.   Allergies:   Ace inhibitors   Social History:  The patient  reports that he quit smoking about 47 years ago. His smoking use included cigarettes. He has never used smokeless tobacco. He reports current alcohol use of about 1.0 standard drink of alcohol per week. He reports that he does not use drugs.   Family History:  Father had high blood pressure and diabetes. Mother had no cardiac problems. No other pertinent family history.   Review of systems complete and found to be negative unless listed in HPI.    BP (!) 140/80   Pulse 64   Wt 78.7 kg (173 lb 6.4 oz)   SpO2 98%   BMI 24.18 kg/m   Wt Readings from Last 3 Encounters:  08/27/24 78.7 kg (173 lb 6.4 oz)  07/16/24 76.2 kg (168 lb)  05/28/24 75.9 kg (167 lb 6.4 oz)   Physical Exam: General:  NAD. No resp difficulty, walked into clinic HEENT: Normal Neck: Supple. No JVD. Cor: Regular rate & rhythm. No rubs, gallops or murmurs. Lungs: Clear Abdomen: Soft, nontender, nondistended.  Extremities: No cyanosis, clubbing, rash, edema Neuro: Alert & oriented x 3, moves all 4 extremities w/o difficulty. Affect pleasant.  ReDs reading: 39%, abnormal  Assessment & Plan: 1.  Chronic HFrEF :  - Due to NICM. Suspect HTN in nature. EF has been down 20-25% since 2015.  - LHC (2015) no CAD. CPX (7/18) with suboptimal effort. - Echo (9/24): EF 20-25%, RV mildly reduced. BAE, mod/sev MR, sev TR - Echo yesterday with worsened EF <20%, sev dilated LV with trabeculae, G3DD,mod RV dysfunction, severe MR/TR - Stable NYHA I-II. Volume ok, ReDs 39%  - Continue torsemide  60 mg daily + 40/20 KCL - Continue metolazone  5 mg + extra 20 KCL weekly - Continue spiro 25 mg daily.  - Continue Jardiance  10 mg daily. - Continue Entresto  97/103 mg bid.  - Pt intolerant to cMRI with claustrophobia.   - Saw EP, no ICD until he can demonstrate compliance with medications.  - Labs today.  2. HTN  - BP  elevated.  - Unlikely to take BiDil tid - Start amlodipine  5 mg daily  3. CKD Stage IIIb - Baseline SCr 1.5-1.7 - Continue SGLT2i - BMET today  4. Frequent PVCs - Zio 7/25: mostly SR, 6.3% PVC burden, 21 VT runs  5. Noncompliance - Med compliance waxes and wanes - Refuses Paramedicine  Follow up in 6 months.  Harlene HERO [place], FNP  08/27/24  Patient seen and examined with the above-signed Advanced Practice Provider and/or Housestaff. I personally reviewed laboratory data, imaging studies and relevant notes. I independently examined the patient and formulated the important aspects of the plan. I have edited the note to reflect any of my changes or salient points. I have personally discussed the plan with the patient and/or family.  Overall much improved. NYHA I-II despite persistent, severe LV dysfunction. Volume status ok. Reports  better compliance with meds. BP still elevated.   General:  Well appearing. No resp difficulty HEENT: normal Neck: supple. no JVD. Carotids 2+ bilat; no bruits. No lymphadenopathy or thryomegaly appreciated. Cor: PMI nondisplaced. Regular rate & rhythm. No rubs, gallops or murmurs. Lungs: clear Abdomen: soft, nontender, nondistended. No hepatosplenomegaly. No bruits or masses. Good bowel sounds. Extremities: no cyanosis, clubbing, rash, edema Neuro: alert & orientedx3, cranial nerves grossly intact. moves all 4 extremities w/o difficulty. Affect pleasant  Overall stable despite limited compliance with regimen. Has seen EP - not candidate for ICD at this time due to compliance issues. Discussed management of volume status at length. Will check labs today. Add amlodipine  to help with HTN  Toribio Fuel, MD  9:53 AM

## 2024-08-27 NOTE — Patient Instructions (Signed)
 Good to see you today!  START Amlodipine  5 mg daily  Labs done today, your results will be available in MyChart, we will contact you for abnormal readings.  Your physician recommends that you schedule a follow-up appointment in: 6 months with app clinic as scheduled  If you have any questions or concerns before your next appointment please send us  a message through [place] or call our office at [phone].    TO LEAVE A MESSAGE FOR THE NURSE SELECT OPTION 2, PLEASE LEAVE A MESSAGE INCLUDING: YOUR NAME DATE OF BIRTH CALL BACK NUMBER REASON FOR CALL**this is important as we prioritize the call backs  YOU WILL RECEIVE A CALL BACK THE SAME DAY AS LONG AS YOU CALL BEFORE 4:00 PM At the Advanced Heart Failure Clinic, you and your health needs are our priority. As part of our continuing mission to provide you with exceptional heart care, we have created designated Provider Care Teams. These Care Teams include your primary Cardiologist (physician) and Advanced Practice Providers (APPs- Physician Assistants and Nurse Practitioners) who all work together to provide you with the care you need, when you need it.   You may see any of the following providers on your designated Care Team at your next follow up: Dr Toribio Fuel Dr Ezra Shuck Dr. Ria Commander Dr. Morene Brownie Amy Lenetta, NP Caffie Shed, [place] [facility] [place], [place] Beckey Coe, NP [place] Lee, NP Ellouise Class, NP Tinnie Redman, PharmD Jaun Bash, PharmD   Please be sure to bring in all your medications bottles to every appointment.    Thank you for choosing [facility]

## 2024-08-27 NOTE — Progress Notes (Signed)
 ReDS Vest / Clip - 08/27/24 1200       ReDS Vest / Clip   Station Marker C    Ruler Value 30    ReDS Value Range Moderate volume overload    ReDS Actual Value 39

## 2024-09-14 NOTE — Addendum Note (Signed)
 Encounter addended by: Bristyl Mclees R, MD on: 09/14/2024 9:55 AM  Actions taken: Level of Service modified, Visit diagnoses modified

## 2024-10-08 ENCOUNTER — Other Ambulatory Visit: Payer: Self-pay

## 2024-10-15 ENCOUNTER — Other Ambulatory Visit: Payer: Self-pay

## 2024-10-19 ENCOUNTER — Other Ambulatory Visit (HOSPITAL_COMMUNITY): Payer: Self-pay

## 2024-10-26 ENCOUNTER — Other Ambulatory Visit: Payer: Self-pay

## 2024-10-28 ENCOUNTER — Other Ambulatory Visit: Payer: Self-pay

## 2024-11-19 NOTE — Progress Notes (Signed)
 Joel Beck                                          MRN: [id number]   11/19/2024   The [facility] Quality Team Specialist reviewed this patient medical record for the purposes of chart review for care gap closure. The following were reviewed: chart review for care gap closure-colorectal cancer screening.    [facility] Quality Team

## 2024-12-29 ENCOUNTER — Other Ambulatory Visit: Payer: Self-pay

## 2025-02-03 ENCOUNTER — Encounter (HOSPITAL_COMMUNITY): Payer: Medicare (Managed Care)
